# Patient Record
Sex: Female | Born: 1975 | Race: White | Hispanic: No | Marital: Married | Smoking: Former smoker
Health system: Southern US, Community
[De-identification: ages and names within clinical notes are randomized; demographics above are authoritative.]

## PROBLEM LIST (undated history)

## (undated) DIAGNOSIS — F419 Anxiety disorder, unspecified: Secondary | ICD-10-CM

## (undated) DIAGNOSIS — N200 Calculus of kidney: Secondary | ICD-10-CM

## (undated) DIAGNOSIS — F32A Depression, unspecified: Secondary | ICD-10-CM

## (undated) DIAGNOSIS — K219 Gastro-esophageal reflux disease without esophagitis: Secondary | ICD-10-CM

## (undated) DIAGNOSIS — G43909 Migraine, unspecified, not intractable, without status migrainosus: Secondary | ICD-10-CM

## (undated) DIAGNOSIS — F329 Major depressive disorder, single episode, unspecified: Secondary | ICD-10-CM

## (undated) DIAGNOSIS — N8110 Cystocele, unspecified: Secondary | ICD-10-CM

## (undated) HISTORY — DX: Migraine, unspecified, not intractable, without status migrainosus: G43.909

## (undated) HISTORY — PX: WISDOM TOOTH EXTRACTION: SHX21

## (undated) HISTORY — DX: Calculus of kidney: N20.0

## (undated) HISTORY — DX: Gastro-esophageal reflux disease without esophagitis: K21.9

## (undated) HISTORY — DX: Depression, unspecified: F32.A

## (undated) HISTORY — DX: Anxiety disorder, unspecified: F41.9

## (undated) HISTORY — DX: Major depressive disorder, single episode, unspecified: F32.9

## (undated) HISTORY — PX: OTHER SURGICAL HISTORY: SHX169

## (undated) HISTORY — DX: Cystocele, unspecified: N81.10

---

## 2008-03-08 LAB — HM HIV SCREENING LAB: HM HIV SCREENING: NEGATIVE

## 2015-02-05 ENCOUNTER — Telehealth: Payer: Self-pay

## 2015-02-05 NOTE — Telephone Encounter (Signed)
That really needs an appointment Please make sure she is not suicidal/homocidal Refer [facility] to [facility] if so or call 911 if emergent Otherwise, appt as soon as available with me or colleague if needed

## 2015-02-05 NOTE — Telephone Encounter (Signed)
Patient notified, she is not suicidal or homicidal. She did not want to schedule an appointment at this time. I gave her the contact info for [facility] and advised her she could check them out.

## 2015-02-05 NOTE — Telephone Encounter (Signed)
She wants to know if you'd be willing to write her an rx for an antidepressant.

## 2015-08-25 ENCOUNTER — Ambulatory Visit (INDEPENDENT_AMBULATORY_CARE_PROVIDER_SITE_OTHER): Payer: BLUE CROSS/BLUE SHIELD | Admitting: Obstetrics and Gynecology

## 2015-08-25 ENCOUNTER — Other Ambulatory Visit: Payer: Self-pay | Admitting: Obstetrics and Gynecology

## 2015-08-25 ENCOUNTER — Encounter: Payer: Self-pay | Admitting: Obstetrics and Gynecology

## 2015-08-25 VITALS — BP 120/55 | HR 80 | Ht 62.0 in | Wt 142.0 lb

## 2015-08-25 DIAGNOSIS — Z01419 Encounter for gynecological examination (general) (routine) without abnormal findings: Secondary | ICD-10-CM

## 2015-08-25 DIAGNOSIS — F3281 Premenstrual dysphoric disorder: Secondary | ICD-10-CM | POA: Diagnosis not present

## 2015-08-25 DIAGNOSIS — Z30431 Encounter for routine checking of intrauterine contraceptive device: Secondary | ICD-10-CM

## 2015-08-25 MED ORDER — FLUOXETINE HCL 10 MG PO CAPS: 10.0000 mg | ORAL_CAPSULE | Freq: Every day | ORAL | Status: DC

## 2015-08-25 NOTE — Progress Notes (Signed)
Subjective:   Mia Thompson is a 40 y.o. G3P0 Caucasian female here for a routine well-woman exam.  No LMP recorded. Patient is not currently having periods (Reason: IUD).    Current complaints: premenstrual depression and hot flashes, flare up of migraines at same time, amenorrhea secondary to Mirena PCP: Chestine Spore       Doesn't need blood work, but is past due for pap  Social History: Sexual: heterosexual Marital Status: married Living situation: with family Occupation: Associate Professor at [employer] Drug Tobacco/alcohol: no tobacco use Illicit drugs: no history of illicit drug use  The following portions of the patient's history were reviewed and updated as appropriate: allergies, current medications, past family history, past medical history, past social history, past surgical history and problem list.  Past Medical History Past Medical History  Diagnosis Date  . Migraines   . Prolapse of female bladder, acquired     Past Surgical History History reviewed. No pertinent past surgical history.  Gynecologic History G3P0  No LMP recorded. Patient is not currently having periods (Reason: IUD). Contraception: IUD Last Pap: ?Marland Kitchen Results were: normal  Obstetric History OB History  Gravida Para Term Preterm AB SAB TAB Ectopic Multiple Living  3         3    # Outcome Date GA Lbr Len/2nd Weight Sex Delivery Anes PTL Lv  3 Gravida 2011    M Vag-Spont   Y  2 Gravida 2005    F Vag-Spont   Y  1 Gravida 2002    F Vag-Spont   Y      Current Medications No current outpatient prescriptions on file prior to visit.   No current facility-administered medications on file prior to visit.    Review of Systems Patient denies any headaches, blurred vision, shortness of breath, chest pain, abdominal pain, problems with bowel movements, urination, or intercourse.  Objective:  BP 120/55 mmHg  Pulse 80  Ht  (1.575 m)  Wt 142 lb (64.411 kg)  BMI 25.97 kg/m2 Physical Exam  General:  Well  developed, well nourished, no acute distress. She is alert and oriented x3. Skin:  Warm and dry Neck:  Midline trachea, no thyromegaly or nodules Cardiovascular: Regular rate and rhythm, no murmur heard Lungs:  Effort normal, all lung fields clear to auscultation bilaterally Breasts:  No dominant palpable mass, retraction, or nipple discharge Abdomen:  Soft, non tender, no hepatosplenomegaly or masses Pelvic:  External genitalia is normal in appearance.  The vagina is normal in appearance. The cervix is bulbous, no CMT.  Thin prep pap is done with HR HPV cotesting. Uterus is felt to be normal size, shape, and contour.  No adnexal masses or tenderness noted.IUD string just inside cervix Extremities:  No swelling or varicosities noted Psych:  She has a normal mood and affect  Assessment:   Healthy well-woman exam Premenstrual mood swings and migraines IUD surviellence  Plan:  Pap obtained, appt made to switch Mirena prozac  po qd prn sent in to take for Premenstrual mood swings.  F/U 1 year for AE, or sooner if needed   Melody Suzan Nailer, CNM

## 2015-08-25 NOTE — Patient Instructions (Signed)
  Place annual gynecologic exam patient instructions here.  Thank you for enrolling in MyChart. Please follow the instructions below to securely access your online medical record. MyChart allows you to send messages to your doctor, view your test results, manage appointments, and more.   How Do I Sign Up? 1. In your Internet browser, go to [employer] and enter [email or web address]. 2. Click on the Sign Up Now link in the Sign In box. You will see the New Member Sign Up page. 3. Enter your MyChart Access Code exactly as it appears below. You will not need to use this code after you've completed the sign-up process. If you do not sign up before the expiration date, you must request a new code.  MyChart Access Code: [email or web address] Expires: 10/06/2015  1:12 PM  4. Enter your Social Security Number ([id number]) and Date of Birth (mm/dd/yyyy) as indicated and click Submit. You will be taken to the next sign-up page. 5. Create a MyChart ID. This will be your MyChart login ID and cannot be changed, so think of one that is secure and easy to remember. 6. Create a MyChart password. You can change your password at any time. 7. Enter your Password Reset Question and Answer. This can be used at a later time if you forget your password.  8. Enter your e-mail address. You will receive e-mail notification when new information is available in MyChart. 9. Click Sign Up. You can now view your medical record.   Additional Information Remember, MyChart is NOT to be used for urgent needs. For medical emergencies, dial 911.

## 2015-08-31 LAB — CYTOLOGY - PAP

## 2015-09-03 ENCOUNTER — Encounter: Payer: Self-pay | Admitting: Obstetrics and Gynecology

## 2015-09-03 ENCOUNTER — Ambulatory Visit (INDEPENDENT_AMBULATORY_CARE_PROVIDER_SITE_OTHER): Payer: BLUE CROSS/BLUE SHIELD | Admitting: Obstetrics and Gynecology

## 2015-09-03 VITALS — BP 88/46 | HR 61 | Wt 141.8 lb

## 2015-09-03 DIAGNOSIS — L309 Dermatitis, unspecified: Secondary | ICD-10-CM

## 2015-09-03 DIAGNOSIS — Z30433 Encounter for removal and reinsertion of intrauterine contraceptive device: Secondary | ICD-10-CM | POA: Diagnosis not present

## 2015-09-03 MED ORDER — CLOBETASOL PROPIONATE 0.05 % EX OINT: 1.0000 "application " | TOPICAL_OINTMENT | Freq: Two times a day (BID) | CUTANEOUS | Status: DC

## 2015-09-03 NOTE — Progress Notes (Signed)
Mia Thompson is a 40 y.o. year old G77P0 Caucasian female who presents for removal and replacement of a Mirena IUD. She was given informed consent for removal and reinsertion of her Mirena. Her Mirena was placed 2011, No LMP recorded. Patient is not currently having periods (Reason: IUD)., and her pregnancy test today was negative.   The risks and benefits of the method and placement have been thouroughly reviewed with the patient and all questions were answered.  Specifically the patient is aware of failure rate of 08/998, expulsion of the IUD and of possible perforation.  The patient is aware of irregular bleeding due to the method and understands the incidence of irregular bleeding diminishes with time.  Signed copy of informed consent in chart.   No LMP recorded. Patient is not currently having periods (Reason: IUD). BP 88/46 mmHg  Pulse 61  Wt 141 lb 12.8 oz (64.32 kg) No results found for this or any previous visit (from the past 24 hour(s)).   Also reports dry itchy skin on mid lower back that flares up every winter. Eczema noted and Clobex cream prescribed.   Appropriate time out taken. A graves speculum was placed in the vagina.  The cervix was visualized, prepped using Betadine. The strings were visible. They were grasped and the Mirena was easily removed. The cervix was then grasped with a single-tooth tenaculum. The uterus was found to be anteroflexed and it sounded to 8 cm.  Mirena IUD placed per manufacturer's recommendations without complications. The strings were trimmed to 3 cm.  The patient tolerated the procedure well.    The patient was given post procedure instructions, including signs and symptoms of infection and to check for the strings after each menses or each month, and refraining from intercourse or anything in the vagina for 3 days.  She was given a Mirena care card with date Mirena placed, and date Mirena to be removed.    Melody Suzan Nailer, CNM

## 2015-09-03 NOTE — Patient Instructions (Signed)
IUD PLACEMENT POST-PROCEDURE INSTRUCTIONS  1. You may take Ibuprofen, Aleve or Tylenol for pain if needed.  Cramping should resolve within in 24 hours.  2. You may have a small amount of spotting.  You should wear a mini pad for the next few days.  3. You may have intercourse after 24 hours.  If you using this for birth control, it is effective immediately.  4. You need to call if you have any pelvic pain, fever, heavy bleeding or foul smelling vaginal discharge.  Irregular bleeding is common the first several months after having an IUD placed. You do not need to call for this reason unless you are concerned.  5. Shower or bathe as normal  Hydrocortisone skin cream, ointment, lotion, or solution What is this medicine? HYDROCORTISONE (hye droe KOR ti sone) is a corticosteroid. It is used on the skin to reduce swelling, redness, itching, and allergic reactions. This medicine may be used for other purposes; ask your health care provider or pharmacist if you have questions. What should I tell my health care provider before I take this medicine? They need to know if you have any of these conditions: -any active infection -diabetes -large areas of burned or damaged skin -skin wasting or thinning -an unusual or allergic reaction to hydrocortisone, corticosteroids, sulfites, other medicines, foods, dyes, or preservatives -pregnant or trying to get pregnant -breast-feeding How should I use this medicine? This medicine is for external use only. Do not take by mouth. Follow the directions on the prescription label. Wash your hands before and after use. Apply a thin film of medicine to the affected area. Do not cover with a bandage or dressing unless your doctor or health care professional tells you to. Do not use on healthy skin or over large areas of skin. Do not get this medicine in your eyes. If you do, rinse out with plenty of cool tap water. Do not to use more medicine than prescribed. Do not use  your medicine more often than directed or for more than 14 days. Talk to your pediatrician regarding the use of this medicine in children. Special care may be needed. While this drug may be prescribed for children as young as 23 years of age for selected conditions, precautions do apply. Do not use this medicine for the treatment of diaper rash unless directed to do so by your doctor or health care professional. If applying this medicine to the diaper area of a child, do not cover with tight-fitting diapers or plastic pants. This may increase the amount of medicine that passes through the skin and increase the risk of serious side effects. Elderly patients are more likely to have damaged skin through aging, and this may increase side effects. This medicine should only be used for brief periods and infrequently in older patients. Overdosage: If you think you have taken too much of this medicine contact a poison control center or emergency room at once. NOTE: This medicine is only for you. Do not share this medicine with others. What if I miss a dose? If you miss a dose, use it as soon as you can. If it is almost time for your next dose, use only that dose. Do not use double or extra doses. What may interact with this medicine? Interactions are not expected. Do not use any other skin products on the affected area without asking your doctor or health care professional. This list may not describe all possible interactions. Give your health care provider  a list of all the medicines, herbs, non-prescription drugs, or dietary supplements you use. Also tell them if you smoke, drink alcohol, or use illegal drugs. Some items may interact with your medicine. What should I watch for while using this medicine? Tell your doctor or health care professional if your symptoms do not start to get better within 7 days or if they get worse. Tell your doctor or health care professional if you are exposed to anyone with measles  or chickenpox, or if you develop sores or blisters that do not heal properly. What side effects may I notice from receiving this medicine? Side effects that you should report to your doctor or health care professional as soon as possible: -allergic reactions like skin rash, itching or hives, swelling of the face, lips, or tongue -burning feeling on the skin -dark red spots on the skin -infection -lack of healing of skin condition -painful, red, pus filled blisters in hair follicles -thinning of the skin Side effects that usually do not require medical attention (report to your doctor or health care professional if they continue or are bothersome): -dry skin, irritation -unusual increased growth of hair on the face or body This list may not describe all possible side effects. Call your doctor for medical advice about side effects. You may report side effects to FDA at 1-800-FDA-1088. Where should I keep my medicine? Keep out of the reach of children. Store at room temperature between 15 and 30 degrees C (59 and 86 degrees F). Do not freeze. Throw away any unused medicine after the expiration date. NOTE: This sheet is a summary. It may not cover all possible information. If you have questions about this medicine, talk to your doctor, pharmacist, or health care provider.    2016, Elsevier/Gold Standard. (2007-12-07 16:08:48)

## 2015-12-01 ENCOUNTER — Telehealth: Payer: Self-pay | Admitting: Obstetrics and Gynecology

## 2015-12-01 NOTE — Telephone Encounter (Signed)
pls advise

## 2015-12-01 NOTE — Telephone Encounter (Signed)
Patient called complaining of a cycle every 3 weeks since having her IUD placed a couple months ago. She wanted to know if something can be called in.Thanks

## 2015-12-02 ENCOUNTER — Other Ambulatory Visit: Payer: Self-pay | Admitting: *Deleted

## 2015-12-02 MED ORDER — NORETHIN-ETH ESTRAD-FE BIPHAS 1 MG-10 MCG / 10 MCG PO TABS: 1.0000 | ORAL_TABLET | Freq: Every day | ORAL | Status: DC

## 2015-12-02 NOTE — Telephone Encounter (Signed)
Done-ac 

## 2015-12-02 NOTE — Telephone Encounter (Signed)
She could do a pack or two of LoLoestrin, samples OK, skip placebos.

## 2016-03-10 ENCOUNTER — Ambulatory Visit (INDEPENDENT_AMBULATORY_CARE_PROVIDER_SITE_OTHER): Payer: BLUE CROSS/BLUE SHIELD | Admitting: Family Medicine

## 2016-03-10 DIAGNOSIS — F411 Generalized anxiety disorder: Secondary | ICD-10-CM

## 2016-03-10 DIAGNOSIS — J309 Allergic rhinitis, unspecified: Secondary | ICD-10-CM | POA: Diagnosis not present

## 2016-03-10 DIAGNOSIS — G43909 Migraine, unspecified, not intractable, without status migrainosus: Secondary | ICD-10-CM | POA: Diagnosis not present

## 2016-03-10 MED ORDER — ESCITALOPRAM OXALATE 10 MG PO TABS: 10.0000 mg | ORAL_TABLET | Freq: Every day | ORAL | 0 refills | Status: DC

## 2016-03-10 MED ORDER — FLUTICASONE PROPIONATE 50 MCG/ACT NA SUSP: 2.0000 | Freq: Every day | NASAL | 3 refills | Status: DC

## 2016-03-10 MED ORDER — TOPIRAMATE 100 MG PO TABS: 100.0000 mg | ORAL_TABLET | Freq: Every day | ORAL | 0 refills | Status: DC

## 2016-03-10 MED ORDER — MONTELUKAST SODIUM 10 MG PO TABS: 10.0000 mg | ORAL_TABLET | Freq: Every day | ORAL | 0 refills | Status: DC

## 2016-03-10 NOTE — Assessment & Plan Note (Signed)
Stable on singulair, flonase, and claritin. Refills today.

## 2016-03-10 NOTE — Assessment & Plan Note (Signed)
Improved with current regimen. Refill topamax today.

## 2016-03-10 NOTE — Progress Notes (Signed)
BP 101/64   Pulse 63   Temp 97.6 F (36.4 C)   Wt 148 lb (67.1 kg)   SpO2 100%   BMI 27.07 kg/m    Subjective:    Patient ID: Mia Thompson, female    DOB: [birth year], 40 y.o.   MRN: [id number]  HPI: Mia Thompson is a 40 y.o. female  Chief Complaint  Patient presents with  . Allergic Rhinitis     worse lately since she has been out of her Singular for 2 weeks. Would like a refill on it and Flonase.  . Medication Refill    She also would like a refill on Topamax, Lexapro. Prefers 90 day supply.   Patient presents for medication management today. Allergy symptoms flaring since running out of singulair 2 weeks ago, previously under good control with that plus flonase and claritin.   Only having 1-2 mild migraines a month on current regimen of topamax with imitrex, baclofen, and ibuprofen during flares. Has seen HA specialist several times, feels she is under good enough control now to stop seeing them.   Anxiety and moods are doing well with current regimen of lexapro.   Relevant past medical, surgical, family and social history reviewed and updated as indicated. Interim medical history since our last visit reviewed. Allergies and medications reviewed and updated.  Past Medical History:  Diagnosis Date  . Anxiety   . Depression   . GERD (gastroesophageal reflux disease)   . Migraines   . Migraines    sees Neuro  . 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 Marital status: Married    Spouse name: N/A  . Number of children: N/A  . Years of education: N/A   Occupational History  . Not on file.   Social History Main Topics  . Smoking status: Never Smoker  . Smokeless tobacco: Never Used  . Alcohol use Yes     Comment: occas  . Drug use: No  . Sexual activity: Yes    Birth control/ protection: IUD     Comment: mirena   Other Topics Concern  . Not on file   Social History Narrative  . No narrative on file    Review of Systems   Constitutional: Negative.   HENT: Positive for rhinorrhea and sneezing.   Eyes: Negative.   Respiratory: Negative.   Cardiovascular: Negative.   Gastrointestinal: Negative.   Musculoskeletal: Negative.   Allergic/Immunologic: Positive for environmental allergies.  Neurological: Negative.   Psychiatric/Behavioral: Negative.     Per HPI unless specifically indicated above     Objective:    BP 101/64   Pulse 63   Temp 97.6 F (36.4 C)   Wt 148 lb (67.1 kg)   SpO2 100%   BMI 27.07 kg/m   Wt Readings from Last 3 Encounters:  03/10/16 148 lb (67.1 kg)  09/03/15 141 lb 12.8 oz (64.3 kg)  08/25/15 142 lb (64.4 kg)    Physical Exam  Constitutional: She is oriented to person, place, and time. She appears well-developed and well-nourished.  HENT:  Head: Atraumatic.  Mouth/Throat: No oropharyngeal exudate.  Nasal turbinates boggy b/l  Eyes: Conjunctivae are normal. No scleral icterus.  Neck: Normal range of motion. Neck supple.  Cardiovascular: Normal rate and normal heart sounds.   Pulmonary/Chest: Effort normal and breath sounds normal.  Musculoskeletal: Normal range of motion.  Lymphadenopathy:    She has no cervical adenopathy.  Neurological: She is alert and  oriented to person, place, and time.  Skin: Skin is warm and dry.  Psychiatric: She has a normal mood and affect. Her behavior is normal.  Nursing note reviewed.     Assessment & Plan:   Problem List Items Addressed This Visit      Cardiovascular and Mediastinum   Migraine    Improved with current regimen. Refill topamax today.      Relevant Medications   baclofen (LIORESAL) 10 MG tablet   SUMAtriptan (IMITREX) 100 MG tablet   escitalopram (LEXAPRO) 10 MG tablet   topiramate (TOPAMAX) 100 MG tablet     Respiratory   Allergic rhinitis    Stable on singulair, flonase, and claritin. Refills today.         Other   Anxiety, generalized    Well controlled with lexapro, refilled today. Continue current  regimen       Other Visit Diagnoses   None.      Follow up plan: Return if symptoms worsen or fail to improve.

## 2016-03-10 NOTE — Patient Instructions (Signed)
Follow up as needed

## 2016-03-10 NOTE — Assessment & Plan Note (Signed)
Well controlled with lexapro, refilled today. Continue current regimen

## 2016-03-17 ENCOUNTER — Ambulatory Visit: Payer: BLUE CROSS/BLUE SHIELD | Admitting: Family Medicine

## 2016-06-14 ENCOUNTER — Other Ambulatory Visit: Payer: Self-pay | Admitting: Family Medicine

## 2016-06-14 NOTE — Telephone Encounter (Signed)
Routing to provider  

## 2016-06-15 ENCOUNTER — Telehealth: Payer: Self-pay

## 2016-06-15 MED ORDER — TOPIRAMATE 100 MG PO TABS: 100.0000 mg | ORAL_TABLET | Freq: Every day | ORAL | 3 refills | Status: DC

## 2016-06-15 NOTE — Telephone Encounter (Signed)
Refills sent

## 2016-06-15 NOTE — Telephone Encounter (Signed)
She wants to know if you can add refills on her rx for Topiramate.

## 2016-08-26 ENCOUNTER — Encounter: Payer: BLUE CROSS/BLUE SHIELD | Admitting: Obstetrics and Gynecology

## 2017-01-16 ENCOUNTER — Ambulatory Visit (INDEPENDENT_AMBULATORY_CARE_PROVIDER_SITE_OTHER): Payer: BLUE CROSS/BLUE SHIELD | Admitting: Family Medicine

## 2017-01-16 ENCOUNTER — Encounter: Payer: Self-pay | Admitting: Family Medicine

## 2017-01-16 VITALS — BP 103/68 | HR 70 | Temp 97.8°F | Wt 160.0 lb

## 2017-01-16 DIAGNOSIS — F411 Generalized anxiety disorder: Secondary | ICD-10-CM

## 2017-01-16 DIAGNOSIS — G43909 Migraine, unspecified, not intractable, without status migrainosus: Secondary | ICD-10-CM | POA: Diagnosis not present

## 2017-01-16 MED ORDER — KETOROLAC TROMETHAMINE 60 MG/2ML IM SOLN
60.0000 mg | Freq: Once | INTRAMUSCULAR | Status: AC
  Administered 2017-01-16: 60 mg via INTRAMUSCULAR

## 2017-01-16 MED ORDER — PROMETHAZINE HCL 25 MG PO TABS: 25.0000 mg | ORAL_TABLET | Freq: Three times a day (TID) | ORAL | 0 refills | Status: DC | PRN

## 2017-01-16 MED ORDER — RIZATRIPTAN BENZOATE 10 MG PO TABS: 10.0000 mg | ORAL_TABLET | ORAL | 3 refills | Status: DC | PRN

## 2017-01-16 MED ORDER — DULOXETINE HCL 20 MG PO CPEP: ORAL_CAPSULE | ORAL | 1 refills | Status: DC

## 2017-01-16 NOTE — Progress Notes (Signed)
BP 103/68   Pulse 70   Temp 97.8 F (36.6 C)   Wt 160 lb (72.6 kg)   SpO2 100%   BMI 29.26 kg/m    Subjective:    Patient ID: Mia Thompson, female    DOB: 05-10-1976, 41 y.o.   MRN: [id number]  HPI: Mia Thompson is a 41 y.o. female  Chief Complaint  Patient presents with  . Headache    Migraine since Saturday. Across her forehead. Tried Imitrex, IBU, Goody powder with tempoary relief. Neuro can't see her until Sept.    Patient presents with worsening migraines the past few weeks. Current migraine started 3 days ago and has been waxing and waning since. Dizziness, nausea, pain across entire forehead, fatigue. Has taken several doses of imitrex, topamax, and ibuprofen with only temporary relief. Has zofran but states it never helps her. Tried a friend's maxalt with some good relief. States she rarely takes the baclofen but did try it with no resolution. Can't get in with her HA specialist until September.   Also notes that she took herself slowly off lexapro d/t low libido and 10-15 lb weight gain while on it. States her anxiety has gotten out of control since being off and wanting to start something else.   Past Medical History:  Diagnosis Date  . Anxiety   . Depression   . GERD (gastroesophageal reflux disease)   . Migraines   . Migraines    sees Neuro  . 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 Marital status: Married    Spouse name: N/A  . Number of children: N/A  . Years of education: N/A   Occupational History  . Not on file.   Social History Main Topics  . Smoking status: Never Smoker  . Smokeless tobacco: Never Used  . Alcohol use Yes     Comment: occas  . Drug use: No  . Sexual activity: Yes    Birth control/ protection: IUD     Comment: mirena   Other Topics Concern  . Not on file   Social History Narrative  . No narrative on file   Relevant past medical, surgical, family and social history reviewed and updated as  indicated. Interim medical history since our last visit reviewed. Allergies and medications reviewed and updated.  Review of Systems  Constitutional: Negative.   HENT: Negative.   Eyes:       Floaters  Respiratory: Negative.   Cardiovascular: Negative.   Gastrointestinal: Positive for nausea.  Genitourinary: Negative.   Musculoskeletal: Negative.   Neurological: Positive for headaches.  Psychiatric/Behavioral: Negative.    Per HPI unless specifically indicated above     Objective:    BP 103/68   Pulse 70   Temp 97.8 F (36.6 C)   Wt 160 lb (72.6 kg)   SpO2 100%   BMI 29.26 kg/m   Wt Readings from Last 3 Encounters:  01/16/17 160 lb (72.6 kg)  03/10/16 148 lb (67.1 kg)  09/03/15 141 lb 12.8 oz (64.3 kg)    Physical Exam  Constitutional: She is oriented to person, place, and time. She appears well-developed and well-nourished. No distress.  HENT:  Head: Atraumatic.  Eyes: Conjunctivae are normal. Pupils are equal, round, and reactive to light. No scleral icterus.  Neck: Normal range of motion. Neck supple.  Cardiovascular: Normal rate and normal heart sounds.   Pulmonary/Chest: Effort normal and breath sounds normal. No respiratory distress.  Musculoskeletal:  Normal range of motion.  Neurological: She is alert and oriented to person, place, and time.  Skin: Skin is warm and dry.  Psychiatric: She has a normal mood and affect. Her behavior is normal.  Nursing note and vitals reviewed.     Assessment & Plan:   Problem List Items Addressed This Visit      Cardiovascular and Mediastinum   Migraine - Primary    IM toradol given today. Will switch to maxalt since it seemed to help more. Continue tomapax. Hoping cymbalta will help some with preventing frequent HAs for her. Baclofen and phenergan prn. F/u in 6 weeks for recheck. Will see HA Specialist in September as scheduled.       Relevant Medications   rizatriptan (MAXALT) 10 MG tablet   DULoxetine (CYMBALTA) 20  MG capsule   ketorolac (TORADOL) injection 60 mg (Completed) (Start on 01/16/2017  2:45 PM)     Other   Anxiety, generalized    Self d/c'd lexapro d/t side effects. Pt agreeable to trying cymbalta to see if it will also help with her headaches. Risks and benefits reviewed. Will f/u in 4-6 weeks.           Follow up plan: Return in about 6 weeks (around 02/27/2017) for Headache f/u.

## 2017-01-16 NOTE — Assessment & Plan Note (Addendum)
IM toradol given today. Will switch to maxalt since it seemed to help more. Continue tomapax. Hoping cymbalta will help some with preventing frequent HAs for her. Baclofen and phenergan prn. F/u in 6 weeks for recheck. Will see HA Specialist in September as scheduled.

## 2017-01-16 NOTE — Assessment & Plan Note (Signed)
Self d/c'd lexapro d/t side effects. Pt agreeable to trying cymbalta to see if it will also help with her headaches. Risks and benefits reviewed. Will f/u in 4-6 weeks.

## 2017-01-26 ENCOUNTER — Encounter: Payer: Self-pay | Admitting: Obstetrics and Gynecology

## 2017-03-01 ENCOUNTER — Encounter: Payer: Self-pay | Admitting: Family Medicine

## 2017-03-01 ENCOUNTER — Ambulatory Visit (INDEPENDENT_AMBULATORY_CARE_PROVIDER_SITE_OTHER): Payer: BLUE CROSS/BLUE SHIELD | Admitting: Family Medicine

## 2017-03-01 VITALS — BP 102/65 | HR 84 | Temp 98.2°F | Wt 164.0 lb

## 2017-03-01 DIAGNOSIS — F411 Generalized anxiety disorder: Secondary | ICD-10-CM

## 2017-03-01 DIAGNOSIS — N921 Excessive and frequent menstruation with irregular cycle: Secondary | ICD-10-CM | POA: Diagnosis not present

## 2017-03-01 DIAGNOSIS — G43909 Migraine, unspecified, not intractable, without status migrainosus: Secondary | ICD-10-CM | POA: Diagnosis not present

## 2017-03-01 MED ORDER — BACLOFEN 10 MG PO TABS: 10.0000 mg | ORAL_TABLET | Freq: Two times a day (BID) | ORAL | 3 refills | Status: DC | PRN

## 2017-03-01 MED ORDER — LEVONORGEST-ETH ESTRAD 91-DAY 0.15-0.03 &0.01 MG PO TABS: 1.0000 | ORAL_TABLET | Freq: Every day | ORAL | 4 refills | Status: DC

## 2017-03-01 MED ORDER — DULOXETINE HCL 20 MG PO CPEP: 20.0000 mg | ORAL_CAPSULE | Freq: Two times a day (BID) | ORAL | 2 refills | Status: DC

## 2017-03-01 MED ORDER — KETOROLAC TROMETHAMINE 60 MG/2ML IM SOLN
60.0000 mg | Freq: Once | INTRAMUSCULAR | Status: AC
  Administered 2017-03-01: 60 mg via INTRAMUSCULAR

## 2017-03-01 NOTE — Progress Notes (Signed)
BP 102/65   Pulse 84   Temp 98.2 F (36.8 C)   Wt 164 lb (74.4 kg)   SpO2 100%   BMI 30.00 kg/m    Subjective:    Patient ID: Mia Thompson, female    DOB: 1976-03-31, 41 y.o.   MRN: [id number]  HPI: Mia Thompson is a 41 y.o. female  Chief Complaint  Patient presents with  . Anxiety  . Migraine   Patient presents today for anxiety and migraine f/u. Was started last month on cymbalta and doing very well. Feels like it's helping both her HAs and her anxiety. Also doing well on the maxalt rather than imitrex. Started taking a few vitamins that she's read can help migraines. Does have a HA today that she attributes to muscle tension in her neck from sleeping in different beds while traveling recently. Taking baclofen and some ibuprofen rarely with some relief.   Was to have seen her GYN recently regarding over a year of heavy and frequent periods despite having the mirena but the appt got cancelled d/t provider being out and she hasn't yet rescheduled. This is her third mirena, and with previous two she had no bleeding. States she's having cycles every 3 weeks or so and they are very heavy and painful. Was tried on several months of lo loestrin last spring to see if that provided relief but noticed no benefit. No known hx of endometriosis, fibroids, or polyps.  Past Medical History:  Diagnosis Date  . Anxiety   . Depression   . GERD (gastroesophageal reflux disease)   . Migraines   . Migraines    sees Neuro  . 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 Marital status: Married    Spouse name: N/A  . Number of children: N/A  . Years of education: N/A   Occupational History  . Not on file.   Social History Main Topics  . Smoking status: Never Smoker  . Smokeless tobacco: Never Used  . Alcohol use Yes     Comment: occas  . Drug use: No  . Sexual activity: Yes    Birth control/ protection: IUD     Comment: mirena   Other Topics Concern  .  Not on file   Social History Narrative  . No narrative on file    Relevant past medical, surgical, family and social history reviewed and updated as indicated. Interim medical history since our last visit reviewed. Allergies and medications reviewed and updated.  Review of Systems  Constitutional: Negative.   HENT: Negative.   Eyes: Negative.   Respiratory: Negative.   Cardiovascular: Negative.   Gastrointestinal: Negative.   Genitourinary: Negative.   Musculoskeletal: Negative.   Skin: Negative.   Neurological: Positive for headaches.  Psychiatric/Behavioral: Negative.     Per HPI unless specifically indicated above     Objective:    BP 102/65   Pulse 84   Temp 98.2 F (36.8 C)   Wt 164 lb (74.4 kg)   SpO2 100%   BMI 30.00 kg/m   Wt Readings from Last 3 Encounters:  03/01/17 164 lb (74.4 kg)  01/16/17 160 lb (72.6 kg)  03/10/16 148 lb (67.1 kg)    Physical Exam  Constitutional: She is oriented to person, place, and time. She appears well-developed and well-nourished. No distress.  HENT:  Head: Atraumatic.  Eyes: Pupils are equal, round, and reactive to light. Conjunctivae are normal. No scleral icterus.  Neck: Normal range of motion. Neck supple.  Cardiovascular: Normal rate and normal heart sounds.   Pulmonary/Chest: Effort normal and breath sounds normal. No respiratory distress.  Musculoskeletal: Normal range of motion.  Neurological: She is alert and oriented to person, place, and time. No cranial nerve deficit.  Skin: Skin is warm and dry.  Psychiatric: She has a normal mood and affect. Her behavior is normal.  Nursing note and vitals reviewed.     Assessment & Plan:   Problem List Items Addressed This Visit      Cardiovascular and Mediastinum   Migraine - Primary    Doing well on current regimen, with recent flare due more to neck tension. Continue current regimen and increase baclofen to nightly or twice daily prn. Continue massage, heat, and rest  prn. IM toradol given today.       Relevant Medications   DULoxetine (CYMBALTA) 20 MG capsule   baclofen (LIORESAL) 10 MG tablet   ketorolac (TORADOL) injection 60 mg (Completed)     Other   Anxiety, generalized    Stable on cymbalta, doing much better. Continue current regimen       Other Visit Diagnoses    Menorrhagia with irregular cycle       Will try seasonique to help reduce bleeding, discussed to reschedule for further work-up with GYN. Will check a CBC given duration and amount of bleeding   Relevant Orders   CBC with Differential/Platelet      Follow up plan: Return in about 3 months (around 06/01/2017) for CPE.

## 2017-03-01 NOTE — Assessment & Plan Note (Signed)
Stable on cymbalta, doing much better. Continue current regimen

## 2017-03-01 NOTE — Assessment & Plan Note (Addendum)
Doing well on current regimen, with recent flare due more to neck tension. Continue current regimen and increase baclofen to nightly or twice daily prn. Continue massage, heat, and rest prn. IM toradol given today.

## 2017-03-02 LAB — CBC WITH DIFFERENTIAL/PLATELET
Basophils Absolute: 0 x10E3/uL (ref 0.0–0.2)
Basos: 0 %
EOS (ABSOLUTE): 0.5 x10E3/uL — ABNORMAL HIGH (ref 0.0–0.4)
Eos: 5 %
Hematocrit: 41.5 % (ref 34.0–46.6)
Hemoglobin: 13.9 g/dL (ref 11.1–15.9)
Immature Grans (Abs): 0 x10E3/uL (ref 0.0–0.1)
Immature Granulocytes: 0 %
Lymphocytes Absolute: 2 x10E3/uL (ref 0.7–3.1)
Lymphs: 22 %
MCH: 31.3 pg (ref 26.6–33.0)
MCHC: 33.5 g/dL (ref 31.5–35.7)
MCV: 94 fL (ref 79–97)
Monocytes Absolute: 0.6 x10E3/uL (ref 0.1–0.9)
Monocytes: 7 %
Neutrophils Absolute: 6.2 x10E3/uL (ref 1.4–7.0)
Neutrophils: 66 %
Platelets: 258 x10E3/uL (ref 150–379)
RBC: 4.44 x10E6/uL (ref 3.77–5.28)
RDW: 13 % (ref 12.3–15.4)
WBC: 9.4 x10E3/uL (ref 3.4–10.8)

## 2017-04-06 ENCOUNTER — Encounter: Payer: Self-pay | Admitting: Obstetrics and Gynecology

## 2017-04-06 ENCOUNTER — Ambulatory Visit (INDEPENDENT_AMBULATORY_CARE_PROVIDER_SITE_OTHER): Payer: BLUE CROSS/BLUE SHIELD | Admitting: Obstetrics and Gynecology

## 2017-04-06 VITALS — BP 98/63 | HR 100 | Ht 62.0 in | Wt 166.1 lb

## 2017-04-06 DIAGNOSIS — Z01419 Encounter for gynecological examination (general) (routine) without abnormal findings: Secondary | ICD-10-CM | POA: Diagnosis not present

## 2017-04-06 DIAGNOSIS — Z975 Presence of (intrauterine) contraceptive device: Secondary | ICD-10-CM | POA: Diagnosis not present

## 2017-04-06 DIAGNOSIS — N921 Excessive and frequent menstruation with irregular cycle: Secondary | ICD-10-CM | POA: Diagnosis not present

## 2017-04-06 MED ORDER — MEDROXYPROGESTERONE ACETATE 150 MG/ML IM SUSP: 150.0000 mg | INTRAMUSCULAR | 4 refills | Status: DC

## 2017-04-06 NOTE — Patient Instructions (Signed)
Preventive Care 18-39 Years, Female Preventive care refers to lifestyle choices and visits with your health care provider that can promote health and wellness. What does preventive care include?  A yearly physical exam. This is also called an annual well check.  Dental exams once or twice a year.  Routine eye exams. Ask your health care provider how often you should have your eyes checked.  Personal lifestyle choices, including: ? Daily care of your teeth and gums. ? Regular physical activity. ? Eating a healthy diet. ? Avoiding tobacco and drug use. ? Limiting alcohol use. ? Practicing safe sex. ? Taking vitamin and mineral supplements as recommended by your health care provider. What happens during an annual well check? The services and screenings done by your health care provider during your annual well check will depend on your age, overall health, lifestyle risk factors, and family history of disease. Counseling Your health care provider may ask you questions about your:  Alcohol use.  Tobacco use.  Drug use.  Emotional well-being.  Home and relationship well-being.  Sexual activity.  Eating habits.  Work and work Statistician.  Method of birth control.  Menstrual cycle.  Pregnancy history.  Screening You may have the following tests or measurements:  Height, weight, and BMI.  Diabetes screening. This is done by checking your blood sugar (glucose) after you have not eaten for a while (fasting).  Blood pressure.  Lipid and cholesterol levels. These may be checked every 5 years starting at age 38.  Skin check.  Hepatitis C blood test.  Hepatitis B blood test.  Sexually transmitted disease (STD) testing.  BRCA-related cancer screening. This may be done if you have a family history of breast, ovarian, tubal, or peritoneal cancers.  Pelvic exam and Pap test. This may be done every 3 years starting at age 38. Starting at age 30, this may be done  every 5 years if you have a Pap test in combination with an HPV test.  Discuss your test results, treatment options, and if necessary, the need for more tests with your health care provider. Vaccines Your health care provider may recommend certain vaccines, such as:  Influenza vaccine. This is recommended every year.  Tetanus, diphtheria, and acellular pertussis (Tdap, Td) vaccine. You may need a Td booster every 10 years.  Varicella vaccine. You may need this if you have not been vaccinated.  HPV vaccine. If you are 39 or younger, you may need three doses over 6 months.  Measles, mumps, and rubella (MMR) vaccine. You may need at least one dose of MMR. You may also need a second dose.  Pneumococcal 13-valent conjugate (PCV13) vaccine. You may need this if you have certain conditions and were not previously vaccinated.  Pneumococcal polysaccharide (PPSV23) vaccine. You may need one or two doses if you smoke cigarettes or if you have certain conditions.  Meningococcal vaccine. One dose is recommended if you are age 68-21 years and a first-year college student living in a residence hall, or if you have one of several medical conditions. You may also need additional booster doses.  Hepatitis A vaccine. You may need this if you have certain conditions or if you travel or work in places where you may be exposed to hepatitis A.  Hepatitis B vaccine. You may need this if you have certain conditions or if you travel or work in places where you may be exposed to hepatitis B.  Haemophilus influenzae type b (Hib) vaccine. You may need this  if you have certain risk factors.  Talk to your health care provider about which screenings and vaccines you need and how often you need them. This information is not intended to replace advice given to you by your health care provider. Make sure you discuss any questions you have with your health care provider. Document Released: 09/20/2001 Document Revised:  04/13/2016 Document Reviewed: 05/26/2015 Elsevier Interactive Patient Education  2017 Elsevier Inc.  

## 2017-04-06 NOTE — Progress Notes (Signed)
Subjective:   Mia Thompson is a 41 y.o. G20P0 Caucasian female here for a routine well-woman exam.  Patient's last menstrual period was 03/25/2017.    Current complaints: Still having irregular bleeding that is prolonged at times since replacing Mirena , not resolved with OCPs. PCP: Laural Benes       doesn't desire labs  Social History: Sexual: heterosexual Marital Status: married Living situation: with father Occupation: Best boy at [employer] Tobacco/alcohol: no tobacco use Illicit drugs: no history of illicit drug use  The following portions of the patient's history were reviewed and updated as appropriate: allergies, current medications, past family history, past medical history, past social history, past surgical history and problem list.  Past Medical History Past Medical History:  Diagnosis Date  . Anxiety   . Depression   . GERD (gastroesophageal reflux disease)   . Migraines   . Migraines    sees Neuro  . Prolapse of female bladder, acquired     Past Surgical History History reviewed. No pertinent surgical history.  Gynecologic History G3P0  Patient's last menstrual period was 03/25/2017. Contraception: IUD Last Pap: 2017. Results were: normal   Obstetric History OB History  Gravida Para Term Preterm AB Living  3         3  SAB TAB Ectopic Multiple Live Births          3    # Outcome Date GA Lbr Len/2nd Weight Sex Delivery Anes PTL Lv  3 Gravida 2011    M Vag-Spont   LIV  2 Gravida 2005    F Vag-Spont   LIV  1 Gravida 2002    F Vag-Spont   LIV      Current Medications Current Outpatient Prescriptions on File Prior to Visit  Medication Sig Dispense Refill  . baclofen (LIORESAL) 10 MG tablet Take 1 tablet (10 mg total) by mouth 2 (two) times daily as needed. 60 each 3  . DULoxetine (CYMBALTA) 20 MG capsule Take 1 capsule (20 mg total) by mouth 2 (two) times daily. 60 capsule 2  . fluticasone (FLONASE) 50 MCG/ACT nasal spray Place 2 sprays into both nostrils  daily. 16 g 3  . levonorgestrel (MIRENA) 20 MCG/24HR IUD 1 each by Intrauterine route once.    . Levonorgestrel-Ethinyl Estradiol (AMETHIA,CAMRESE) 0.15-0.03 &0.01 MG tablet Take 1 tablet by mouth daily. 1 Package 4  . Loratadine (CLARITIN) 10 MG CAPS Take by mouth.    . montelukast (SINGULAIR) 10 MG tablet Take 1 tablet (10 mg total) by mouth at bedtime. 90 tablet 3  . Multiple Vitamin (MULTI-VITAMINS) TABS Take by mouth daily.    . promethazine (PHENERGAN) 25 MG tablet Take 1 tablet (25 mg total) by mouth every 8 (eight) hours as needed for nausea or vomiting. 20 tablet 0  . rizatriptan (MAXALT) 10 MG tablet Take 1 tablet (10 mg total) by mouth as needed for migraine. May repeat in 2 hours if needed 10 tablet 3  . clobetasol ointment (TEMOVATE) 0.05 % Apply 1 application topically 2 (two) times daily. Apply to affected area (Patient not taking: Reported on 04/06/2017) 30 g 2  . topiramate (TOPAMAX) 100 MG tablet Take 1 tablet (100 mg total) by mouth at bedtime. 90 tablet 3   No current facility-administered medications on file prior to visit.     Review of Systems Patient denies any headaches, blurred vision, shortness of breath, chest pain, abdominal pain, problems with bowel movements, urination, or intercourse.  Objective:  BP 98/63  Pulse 100   Ht 5\' 2"  (1.575 m)   Wt 166 lb 1.6 oz (75.3 kg)   LMP 03/25/2017   BMI 30.38 kg/m  Physical Exam  General:  Well developed, well nourished, no acute distress. She is alert and oriented x3. Skin:  Warm and dry Neck:  Midline trachea, no thyromegaly or nodules Cardiovascular: Regular rate and rhythm, no murmur heard Lungs:  Effort normal, all lung fields clear to auscultation bilaterally Breasts:  No dominant palpable mass, retraction, or nipple discharge Abdomen:  Soft, non tender, no hepatosplenomegaly or masses Pelvic:  External genitalia is normal in appearance.  The vagina is normal in appearance. The cervix is bulbous, no CMT. IUD  string noted  Thin prep pap is not doen. Uterus is felt to be normal size, shape, and contour.  No adnexal masses or tenderness noted. Extremities:  No swelling or varicosities noted Psych:  She has a normal mood and affect  Assessment:   Healthy well-woman exam IUD irregular bleeding PMS  Plan:  Recommended IUD removal and switching to Depo- rx sent in and will return next week for removal and injection. F/U 1 year for AE, or sooner if needed Mammogram ordered  Maisa Bedingfield Suzan NailerN Bethanee Redondo, CNM

## 2017-04-11 ENCOUNTER — Ambulatory Visit (INDEPENDENT_AMBULATORY_CARE_PROVIDER_SITE_OTHER): Payer: BLUE CROSS/BLUE SHIELD | Admitting: Obstetrics and Gynecology

## 2017-04-11 ENCOUNTER — Encounter: Payer: Self-pay | Admitting: Obstetrics and Gynecology

## 2017-04-11 VITALS — BP 112/83 | HR 79 | Wt 164.4 lb

## 2017-04-11 DIAGNOSIS — Z3042 Encounter for surveillance of injectable contraceptive: Secondary | ICD-10-CM

## 2017-04-11 DIAGNOSIS — N938 Other specified abnormal uterine and vaginal bleeding: Secondary | ICD-10-CM

## 2017-04-11 DIAGNOSIS — T839XXA Unspecified complication of genitourinary prosthetic device, implant and graft, initial encounter: Secondary | ICD-10-CM | POA: Diagnosis not present

## 2017-04-11 MED ORDER — MEDROXYPROGESTERONE ACETATE 150 MG/ML IM SUSP
150.0000 mg | Freq: Once | INTRAMUSCULAR | Status: AC
  Administered 2017-04-11: 150 mg via INTRAMUSCULAR

## 2017-04-11 NOTE — Progress Notes (Signed)
Rae LipsLaura Baird is a 41 y.o. year old 73P0 Caucasian female who presents for removal of a Mirena IUD. Her Mirena IUD was placed 09/03/15 for contraception and DUB. See previous notes, as patient has reported continuous and at time heavy mensstrual bleeding since insertion.   Patient's last menstrual period was 03/25/2017. BP 112/83   Pulse 79   Wt 164 lb 6.4 oz (74.6 kg)   LMP 03/25/2017   BMI 30.07 kg/m   Time out was performed.  A graves speculum was placed in the vagina.  The cervix was visualized, and the strings were not visible. Attempted to retrieve IUD with retrieval device but unsuccessful. Mild spotting noted.  Depo given as planned and will stop OCPs today. Sent to hospital for pelvic u/s and labs obtained. F/U after pelvic ultrasound.  Melody Suzan NailerN Shambley, CNM

## 2017-04-14 ENCOUNTER — Other Ambulatory Visit: Payer: Self-pay | Admitting: Obstetrics and Gynecology

## 2017-04-14 ENCOUNTER — Ambulatory Visit
Admission: RE | Admit: 2017-04-14 | Discharge: 2017-04-14 | Disposition: A | Discharge status: Home / Self Care | Payer: BLUE CROSS/BLUE SHIELD | Source: Ambulatory Visit | Attending: Obstetrics and Gynecology | Admitting: Obstetrics and Gynecology

## 2017-04-14 DIAGNOSIS — T839XXA Unspecified complication of genitourinary prosthetic device, implant and graft, initial encounter: Secondary | ICD-10-CM

## 2017-04-14 DIAGNOSIS — N938 Other specified abnormal uterine and vaginal bleeding: Secondary | ICD-10-CM | POA: Diagnosis not present

## 2017-04-14 DIAGNOSIS — N83292 Other ovarian cyst, left side: Secondary | ICD-10-CM | POA: Diagnosis not present

## 2017-04-15 LAB — THYROID PANEL WITH TSH
FREE THYROXINE INDEX: 1.9 (ref 1.2–4.9)
T3 UPTAKE RATIO: 22 % — AB (ref 24–39)
T4 TOTAL: 8.8 ug/dL (ref 4.5–12.0)
TSH: 2.67 u[IU]/mL (ref 0.450–4.500)

## 2017-04-15 LAB — CBC
HEMATOCRIT: 41.3 % (ref 34.0–46.6)
HEMOGLOBIN: 14.3 g/dL (ref 11.1–15.9)
MCH: 30.6 pg (ref 26.6–33.0)
MCHC: 34.6 g/dL (ref 31.5–35.7)
MCV: 88 fL (ref 79–97)
Platelets: 246 10*3/uL (ref 150–379)
RBC: 4.68 x10E6/uL (ref 3.77–5.28)
RDW: 13.6 % (ref 12.3–15.4)
WBC: 5.3 10*3/uL (ref 3.4–10.8)

## 2017-04-15 LAB — COMPREHENSIVE METABOLIC PANEL
ALBUMIN: 4.4 g/dL (ref 3.5–5.5)
ALK PHOS: 40 IU/L (ref 39–117)
ALT: 11 IU/L (ref 0–32)
AST: 15 IU/L (ref 0–40)
Albumin/Globulin Ratio: 1.8 (ref 1.2–2.2)
BILIRUBIN TOTAL: 0.2 mg/dL (ref 0.0–1.2)
BUN / CREAT RATIO: 17 (ref 9–23)
BUN: 16 mg/dL (ref 6–24)
CHLORIDE: 109 mmol/L — AB (ref 96–106)
CO2: 19 mmol/L — ABNORMAL LOW (ref 20–29)
Calcium: 9.8 mg/dL (ref 8.7–10.2)
Creatinine, Ser: 0.96 mg/dL (ref 0.57–1.00)
GFR calc non Af Amer: 74 mL/min/{1.73_m2} (ref >59)
GFR, EST AFRICAN AMERICAN: 86 mL/min/{1.73_m2} (ref >59)
GLUCOSE: 89 mg/dL (ref 65–99)
Globulin, Total: 2.5 g/dL (ref 1.5–4.5)
POTASSIUM: 4.9 mmol/L (ref 3.5–5.2)
Sodium: 144 mmol/L (ref 134–144)
TOTAL PROTEIN: 6.9 g/dL (ref 6.0–8.5)

## 2017-04-15 LAB — FERRITIN: FERRITIN: 95 ng/mL (ref 15–150)

## 2017-04-15 LAB — PROTIME-INR
INR: 1 (ref 0.8–1.2)
PROTHROMBIN TIME: 9.9 s (ref 9.1–12.0)

## 2017-04-15 LAB — APTT: APTT: 26 s (ref 24–33)

## 2017-04-15 LAB — B12 AND FOLATE PANEL
Folate: 12.1 ng/mL (ref >3.0)
VITAMIN B 12: 189 pg/mL — AB (ref 232–1245)

## 2017-04-15 LAB — FACTOR 5 LEIDEN

## 2017-04-16 ENCOUNTER — Other Ambulatory Visit: Payer: Self-pay | Admitting: Obstetrics and Gynecology

## 2017-04-16 DIAGNOSIS — T839XXD Unspecified complication of genitourinary prosthetic device, implant and graft, subsequent encounter: Secondary | ICD-10-CM

## 2017-04-16 DIAGNOSIS — E538 Deficiency of other specified B group vitamins: Secondary | ICD-10-CM

## 2017-04-16 MED ORDER — CYANOCOBALAMIN 1000 MCG/ML IJ SOLN: 1000.0000 ug | INTRAMUSCULAR | 1 refills | Status: DC

## 2017-04-17 ENCOUNTER — Other Ambulatory Visit: Payer: Self-pay | Admitting: Family Medicine

## 2017-04-17 DIAGNOSIS — R51 Headache: Secondary | ICD-10-CM | POA: Diagnosis not present

## 2017-04-17 DIAGNOSIS — G43001 Migraine without aura, not intractable, with status migrainosus: Secondary | ICD-10-CM | POA: Diagnosis not present

## 2017-04-17 DIAGNOSIS — G479 Sleep disorder, unspecified: Secondary | ICD-10-CM | POA: Diagnosis not present

## 2017-04-18 ENCOUNTER — Ambulatory Visit: Payer: Self-pay

## 2017-04-19 ENCOUNTER — Telehealth: Payer: Self-pay | Admitting: Obstetrics and Gynecology

## 2017-04-19 NOTE — Telephone Encounter (Signed)
Patient called and and stated that she missed a call from Amy, and would like a call back. No other information was disclosed. Please advise.

## 2017-04-20 ENCOUNTER — Ambulatory Visit
Admission: RE | Admit: 2017-04-20 | Discharge: 2017-04-20 | Disposition: A | Discharge status: Home / Self Care | Payer: BLUE CROSS/BLUE SHIELD | Source: Ambulatory Visit | Attending: Obstetrics and Gynecology | Admitting: Obstetrics and Gynecology

## 2017-04-20 ENCOUNTER — Other Ambulatory Visit: Payer: Self-pay | Admitting: Obstetrics and Gynecology

## 2017-04-20 ENCOUNTER — Telehealth: Payer: Self-pay | Admitting: Obstetrics and Gynecology

## 2017-04-20 DIAGNOSIS — T839XXD Unspecified complication of genitourinary prosthetic device, implant and graft, subsequent encounter: Secondary | ICD-10-CM | POA: Insufficient documentation

## 2017-04-20 DIAGNOSIS — Z30431 Encounter for routine checking of intrauterine contraceptive device: Secondary | ICD-10-CM | POA: Insufficient documentation

## 2017-04-20 NOTE — Telephone Encounter (Signed)
Spoke with pt -ac 

## 2017-04-20 NOTE — Telephone Encounter (Signed)
Mia Thompson spoke with pt

## 2017-04-20 NOTE — Telephone Encounter (Signed)
Patient called and stated that she would like for Amy to call her when the X-ray results are received. The patient would like to be called at her job. No other information was disclosed, Please advise.

## 2017-04-24 ENCOUNTER — Telehealth: Payer: Self-pay | Admitting: Obstetrics and Gynecology

## 2017-04-24 NOTE — Telephone Encounter (Signed)
Patient called with questions regarding an upcoming appointment. Thanks

## 2017-04-26 NOTE — Telephone Encounter (Signed)
Spoke with pt

## 2017-05-02 ENCOUNTER — Encounter: Payer: Self-pay | Admitting: Obstetrics and Gynecology

## 2017-05-02 ENCOUNTER — Ambulatory Visit (INDEPENDENT_AMBULATORY_CARE_PROVIDER_SITE_OTHER): Payer: BLUE CROSS/BLUE SHIELD | Admitting: Obstetrics and Gynecology

## 2017-05-02 VITALS — BP 123/74 | HR 90 | Ht 62.0 in | Wt 164.4 lb

## 2017-05-02 DIAGNOSIS — T8332XS Displacement of intrauterine contraceptive device, sequela: Secondary | ICD-10-CM

## 2017-05-02 DIAGNOSIS — T8389XS Other specified complication of genitourinary prosthetic devices, implants and grafts, sequela: Secondary | ICD-10-CM | POA: Diagnosis not present

## 2017-05-02 DIAGNOSIS — N939 Abnormal uterine and vaginal bleeding, unspecified: Secondary | ICD-10-CM | POA: Diagnosis not present

## 2017-05-02 DIAGNOSIS — R102 Pelvic and perineal pain: Secondary | ICD-10-CM

## 2017-05-02 NOTE — H&P (Signed)
GYNECOLOGY PREOPERATIVE HISTORY AND PHYSICAL   Subjective:  Mia Thompson is a 41 y.o. G3P0 here for surgical management of perforated IUD with migration. . No significant preoperative concerns.  Proposed surgery: Laparoscopic IUD retrieval    Pertinent Gynecological History: Menses: heavy and irregular Last mammogram: has never had a mammogram  Last pap: normal Date: 08/2015   Past Medical History:  Diagnosis Date  . Anxiety   . Depression   . GERD (gastroesophageal reflux disease)   . Migraines   . Migraines    sees Neuro  . Prolapse of female bladder, acquired    Past Surgical History:  Procedure Laterality Date  . WISDOM TOOTH EXTRACTION     OB History  Gravida Para Term Preterm AB Living  3         3  SAB TAB Ectopic Multiple Live Births          3    # Outcome Date GA Lbr Len/2nd Weight Sex Delivery Anes PTL Lv  3 Gravida 2011    M Vag-Spont   LIV  2 Gravida 2005    F Vag-Spont   LIV  1 Gravida 2002    F Vag-Spont   LIV    Patient denies any other pertinent gynecologic issues.  Family History  Problem Relation Age of Onset  . Adopted: Yes   Social History   Social History  . Marital status: Married    Spouse name: N/A  . Number of children: N/A  . Years of education: N/A   Occupational History  . Not on file.   Social History Main Topics  . Smoking status: Never Smoker  . Smokeless tobacco: Never Used  . Alcohol use Yes     Comment: occas  . Drug use: No  . Sexual activity: Yes    Birth control/ protection: IUD     Comment: mirena   Other Topics Concern  . Not on file   Social History Narrative  . No narrative on file   Current Outpatient Prescriptions on File Prior to Visit  Medication Sig Dispense Refill  . baclofen (LIORESAL) 10 MG tablet Take 1 tablet (10 mg total) by mouth 2 (two) times daily as needed. 60 each 3  . cyanocobalamin (,VITAMIN B-12,) 1000 MCG/ML injection Inject 1 mL (1,000 mcg total) into the muscle every 30  (thirty) days. 10 mL 1  . fluticasone (FLONASE) 50 MCG/ACT nasal spray Place 2 sprays into both nostrils daily. 16 g 3  . levonorgestrel (MIRENA) 20 MCG/24HR IUD 1 each by Intrauterine route once.    . Loratadine (CLARITIN) 10 MG CAPS Take by mouth.    . medroxyPROGESTERone (DEPO-PROVERA) 150 MG/ML injection Inject 1 mL (150 mg total) into the muscle every 3 (three) months. 1 mL 4  . montelukast (SINGULAIR) 10 MG tablet Take 1 tablet (10 mg total) by mouth at bedtime. 90 tablet 3  . Multiple Vitamin (MULTI-VITAMINS) TABS Take by mouth daily.    . promethazine (PHENERGAN) 25 MG tablet Take 1 tablet (25 mg total) by mouth every 8 (eight) hours as needed for nausea or vomiting. 20 tablet 0  . rizatriptan (MAXALT) 10 MG tablet Take 1 tablet (10 mg total) by mouth as needed for migraine. May repeat in 2 hours if needed 10 tablet 0  . topiramate (TOPAMAX) 100 MG tablet Take 100 mg by mouth 2 (two) times daily.    Marland Kitchen topiramate (TOPAMAX) 100 MG tablet Take 1 tablet (100 mg total) by mouth  at bedtime. 90 tablet 3   No current facility-administered medications on file prior to visit.    Allergies  Allergen Reactions  . Vantin [Cefpodoxime] Rash    Shortness of breath      Review of Systems Constitutional: No recent fever/chills/sweats Respiratory: No recent cough/bronchitis Cardiovascular: No chest pain Gastrointestinal: No recent nausea/vomiting/diarrhea.  Positive for excessive gas and bloating.  Genitourinary: No UTI symptoms.  Positive for Intermittent pelvic discomfort.  Hematologic/lymphatic:No history of coagulopathy or recent blood thinner use    Objective:   Blood pressure 123/74, pulse 90, height  (1.575 m), weight 164 lb 6.4 oz (74.6 kg). CONSTITUTIONAL: Well-developed, well-nourished female in no acute distress.  HENT:  Normocephalic, atraumatic, External right and left ear normal. Oropharynx is clear and moist EYES: Conjunctivae and EOM are normal. Pupils are equal, round,  and reactive to light. No scleral icterus.  NECK: Normal range of motion, supple, no masses SKIN: Skin is warm and dry. No rash noted. Not diaphoretic. No erythema. No pallor. NEUROLOGIC: Alert and oriented to person, place, and time. Normal reflexes, muscle tone coordination. No cranial nerve deficit noted. PSYCHIATRIC: Normal mood and affect. Normal behavior. Normal judgment and thought content. CARDIOVASCULAR: Normal heart rate noted, regular rhythm RESPIRATORY: Effort and breath sounds normal, no problems with respiration noted ABDOMEN: Soft, nontender, nondistended. PELVIC: Deferred MUSCULOSKELETAL: Normal range of motion. No edema and no tenderness. 2+ distal pulses.    Labs: No results found for this or any previous visit (from the past 336 hour(s)).   Imaging Studies: Dg Pelvis 1-2 Views  Result Date: 04/20/2017 CLINICAL DATA:  Assess positioning of the IUD. On physical examination last week the string was not visible. EXAM: PELVIS - 1-2 VIEW COMPARISON:  Pelvic ultrasound of April 14, 2017 FINDINGS: The IUD is located to the left of midline just inferior to the left aspect of the sacrum. The bowel gas pattern is normal. The bony structures are unremarkable. IMPRESSION: The IUD is present to the left of midline within the pelvis. If further imaging is felt indicated for localization purposes, pelvic CT scanning would be a useful next imaging step. Electronically Signed   By: David  [place] M.D.   On: 04/20/2017 10:07   US Pelvis Transvanginal Non-ob (tv Only)  Result Date: 04/14/2017 CLINICAL DATA:  Initial evaluation for IUD and dysfunctional uterine bleeding. EXAM: TRANSABDOMINAL AND TRANSVAGINAL ULTRASOUND OF PELVIS TECHNIQUE: Both transabdominal and transvaginal ultrasound examinations of the pelvis were performed. Transabdominal technique was performed for global imaging of the pelvis including uterus, ovaries, adnexal regions, and pelvic cul-de-sac. It was necessary to proceed  with endovaginal exam following the transabdominal exam to visualize the uterus and ovaries. COMPARISON:  None FINDINGS: Uterus Measurements: 7.1 x 3.0 x 3.8 cm. No fibroids or other mass visualized. Tiny 1 mm echogenic focus at the lower uterine segment noted, likely a small calcification. This is of doubtful significance. Endometrium Thickness: 4.2 mm. No focal abnormality visualized. IUD not visualize within the endometrial canal. Right ovary Measurements: 3.2 x 1.9 x 2.2 cm. Complex cystic lesion measuring 2.0 x 1.6 x 1.7 cm with internal lace-like architecture, most consistent with a probable hemorrhagic cyst. Left ovary Measurements: 2.8 x 1.5 x 1.3 cm. Normal appearance/no adnexal mass. Other findings No abnormal free fluid. IMPRESSION: 1. IUD not visualized within the endometrial canal. Follow-up examination with plain film radiograph of the pelvis could be performed for confirmation purposes as clinically desired. 2. Normal sonographic appearance of the endometrium, measuring 4.2 mm in thickness  without focal abnormality. 3. 2 cm complex right ovarian cyst, most consistent with a hemorrhagic cyst. This is almost certainly benign, with no follow-up imaging recommended. This recommendation follows the consensus statement: Management of Asymptomatic Ovarian and Other Adnexal Cysts Imaged at [place]: Society of Radiologists in Ultrasound Consensus Conference Statement. Radiology 2010; [phone]. Electronically Signed   By: Rise Mu M.D.   On: 04/14/2017 17:53   US Pelvis (transabdominal Only)  Result Date: 04/14/2017 CLINICAL DATA:  Initial evaluation for IUD and dysfunctional uterine bleeding. EXAM: TRANSABDOMINAL AND TRANSVAGINAL ULTRASOUND OF PELVIS TECHNIQUE: Both transabdominal and transvaginal ultrasound examinations of the pelvis were performed. Transabdominal technique was performed for global imaging of the pelvis including uterus, ovaries, adnexal regions, and pelvic cul-de-sac. It was  necessary to proceed with endovaginal exam following the transabdominal exam to visualize the uterus and ovaries. COMPARISON:  None FINDINGS: Uterus Measurements: 7.1 x 3.0 x 3.8 cm. No fibroids or other mass visualized. Tiny 1 mm echogenic focus at the lower uterine segment noted, likely a small calcification. This is of doubtful significance. Endometrium Thickness: 4.2 mm. No focal abnormality visualized. IUD not visualize within the endometrial canal. Right ovary Measurements: 3.2 x 1.9 x 2.2 cm. Complex cystic lesion measuring 2.0 x 1.6 x 1.7 cm with internal lace-like architecture, most consistent with a probable hemorrhagic cyst. Left ovary Measurements: 2.8 x 1.5 x 1.3 cm. Normal appearance/no adnexal mass. Other findings No abnormal free fluid. IMPRESSION: 1. IUD not visualized within the endometrial canal. Follow-up examination with plain film radiograph of the pelvis could be performed for confirmation purposes as clinically desired. 2. Normal sonographic appearance of the endometrium, measuring 4.2 mm in thickness without focal abnormality. 3. 2 cm complex right ovarian cyst, most consistent with a hemorrhagic cyst. This is almost certainly benign, with no follow-up imaging recommended. This recommendation follows the consensus statement: Management of Asymptomatic Ovarian and Other Adnexal Cysts Imaged at [place]: Society of Radiologists in Ultrasound Consensus Conference Statement. Radiology 2010; [phone]. Electronically Signed   By: Rise Mu M.D.   On: 04/14/2017 17:53    Assessment:      Perforated IUD (with migration)      Plan:    Counseling: Procedure, risks, reasons, benefits and complications (including injury to bowel, bladder, major blood vessel, ureter, bleeding, possibility of transfusion, infection, or fistula formation) reviewed in detail. Likelihood of success in alleviating the patient's condition was discussed. Routine postoperative instructions will be reviewed with  the patient and her family in detail after surgery.  The patient concurred with the proposed plan, giving informed written consent for the surgery.  Surgery scheduled for 05/08/2017.  Preop testing ordered. Instructions reviewed, including NPO after midnight.    Hildred Laser, MD [facility]

## 2017-05-02 NOTE — Progress Notes (Signed)
GYNECOLOGY PROGRESS NOTE  Subjective:    Patient ID: Mia Thompson, female    DOB: 1976/04/02, 41 y.o.   MRN: [id number]  HPI  Patient is a 41 y.o. G27P0 female who presents for surgical consultation.  Patient was referred from midwifery service [facility] [place], [place]) due to complaints of pelvic pain, abnormal bleeding, and IUD perforation and migration.  Patient notes that she has had the IUD in place for ~ 1.5 years.  This is her third IUD.  States that since insertion of most recent IUD she has had irregular and sometimes prolonged episodes of bleeding.    The following portions of the patient's history were reviewed and updated as appropriate: allergies, current medications, past family history, past medical history, past social history, past surgical history and problem list.  Review of Systems A comprehensive review of systems was negative except for: Genitourinary: positive for feeling as though her bladder had dropped. Notes that she feels a sensation of something in the pelvis. Denies leakage of urine.    Objective:   Blood pressure 123/74, pulse 90, height  (1.575 m), weight 164 lb 6.4 oz (74.6 kg). General appearance: alert and no distress  Abdomen: bowel sounds present, non-tender, no masses or organomegaly Pelvis: deferred See H&P for full physical exam details.    Imaging:  CLINICAL DATA:  Initial evaluation for IUD and dysfunctional uterine bleeding.  EXAM (04/14/2017): TRANSABDOMINAL AND TRANSVAGINAL ULTRASOUND OF PELVIS  TECHNIQUE: Both transabdominal and transvaginal ultrasound examinations of the pelvis were performed. Transabdominal technique was performed for global imaging of the pelvis including uterus, ovaries, adnexal regions, and pelvic cul-de-sac. It was necessary to proceed with endovaginal exam following the transabdominal exam to visualize the uterus and ovaries.  COMPARISON:  None  FINDINGS: Uterus  Measurements: 7.1 x 3.0 x 3.8 cm.  No fibroids or other mass visualized. Tiny 1 mm echogenic focus at the lower uterine segment noted, likely a small calcification. This is of doubtful significance.  Endometrium  Thickness: 4.2 mm. No focal abnormality visualized. IUD not visualize within the endometrial canal.  Right ovary  Measurements: 3.2 x 1.9 x 2.2 cm. Complex cystic lesion measuring 2.0 x 1.6 x 1.7 cm with internal lace-like architecture, most consistent with a probable hemorrhagic cyst.  Left ovary  Measurements: 2.8 x 1.5 x 1.3 cm. Normal appearance/no adnexal mass.  Other findings  No abnormal free fluid.  IMPRESSION: 1. IUD not visualized within the endometrial canal. Follow-up examination with plain film radiograph of the pelvis could be performed for confirmation purposes as clinically desired. 2. Normal sonographic appearance of the endometrium, measuring 4.2 mm in thickness without focal abnormality. 3. 2 cm complex right ovarian cyst, most consistent with a hemorrhagic cyst. This is almost certainly benign, with no follow-up imaging recommended. This recommendation follows the consensus statement: Management of Asymptomatic Ovarian and Other Adnexal Cysts Imaged at [place]: Society of Radiologists in Ultrasound Consensus Conference Statement. Radiology 2010; [phone].     CLINICAL DATA:  Assess positioning of the IUD. On physical examination last week the string was not visible.  EXAM: PELVIS - 1-2 VIEW  COMPARISON:  Pelvic ultrasound of April 14, 2017  FINDINGS: The IUD is located to the left of midline just inferior to the left aspect of the sacrum. The bowel gas pattern is normal. The bony structures are unremarkable.  IMPRESSION: The IUD is present to the left of midline within the pelvis. If further imaging is felt indicated for localization purposes, pelvic CT scanning  would be a useful next imaging step.  Assessment:   IUD migration/perforation Pelvic  pain Abnormal uterine bleeding Pelvic pressure (possible cystocele)  Plan:   - Patient for consultation for surgical management to remove intrabdominal IUD.  The risks of surgery were discussed in detail with the patient including but not limited to: bleeding which may require transfusion or reoperation; infection which may require prolonged hospitalization or re-hospitalization and antibiotic therapy; injury to bowel, bladder, ureters and major vessels or other surrounding organs; need for additional procedures including laparotomy; thromboembolic phenomenon, incisional problems and other postoperative or anesthesia complications.  Patient was told that the likelihood that her condition and symptoms will be treated effectively with this surgical management was very high; the postoperative expectations were also discussed in detail.  All questions were answered.  She was told that she will be contacted by our surgical scheduler regarding the time and date of her surgery; routine preoperative instructions of having nothing to eat or drink after midnight on the day prior to surgery and also coming to the hospital 1.5 hours prior to her time of surgery were also emphasized.  She was told she will be called for a preoperative appointment this week prior to surgery and will be given further preoperative instructions at that visit. Printed patient education handouts about the procedure were given to the patient to review at home.  Surgery scheduled for 05/08/2017.  - Patient complains of "bladder drop".  Denies urinary leakage. Will evaluate further while patient is under anesthesia to assess for cystocele. Discussed possible management options if cystocele was diagnosed, depending on the severity of the prolapse, including expectant management (Kegel's, pelvic floor physical therapy), pessary, or surgical management with surgery if moderate to severe prolapse noted.  Patient doing Kegel exercises, however  infrequently.  Discussed appropriate frequency to be effective.   A total of 25 minutes were spent face-to-face with the patient during this encounter and over half of that time involved counseling and coordination of care.   Hildred Laser, MD [facility]

## 2017-05-02 NOTE — Patient Instructions (Addendum)
Diagnostic Laparoscopy  A diagnostic laparoscopy is a procedure to diagnose diseases in the abdomen. During the procedure, a thin, lighted, pencil-sized instrument called a laparoscope is inserted into the abdomen through an incision. The laparoscope allows your health care provider to look at the organs inside your body.  Tell a health care provider about:   Any allergies you have.   All medicines you are taking, including vitamins, herbs, eye drops, creams, and over-the-counter medicines.   Any problems you or family members have had with anesthetic medicines.   Any blood disorders you have.   Any surgeries you have had.   Any medical conditions you have.  What are the risks?  Generally, this is a safe procedure. However, problems can occur, which may include:   Infection.   Bleeding.   Damage to other organs.   Allergic reaction to the anesthetics used during the procedure.    What happens before the procedure?   Do not eat or drink anything after midnight on the night before the procedure or as directed by your health care provider.   Ask your health care provider about:  ? Changing or stopping your regular medicines.  ? Taking medicines such as aspirin and ibuprofen. These medicines can thin your blood. Do not take these medicines before your procedure if your health care provider instructs you not to.   Plan to have someone take you home after the procedure.  What happens during the procedure?   You may be given a medicine to help you relax (sedative).   You will be given a medicine to make you sleep (general anesthetic).   Your abdomen will be inflated with a gas. This will make your organs easier to see.   Small incisions will be made in your abdomen.   A laparoscope and other small instruments will be inserted into the abdomen through the incisions.   A tissue sample may be removed from an organ in the abdomen for examination.   The instruments will be removed from the abdomen.   The  gas will be released.   The incisions will be closed with stitches (sutures).  What happens after the procedure?  Your blood pressure, heart rate, breathing rate, and blood oxygen level will be monitored often until the medicines you were given have worn off.  This information is not intended to replace advice given to you by your health care provider. Make sure you discuss any questions you have with your health care provider.  Document Released: 10/31/2000 Document Revised: 12/03/2015 Document Reviewed: 03/07/2014  Elsevier Interactive Patient Education  2018 [employer]

## 2017-05-03 DIAGNOSIS — G4719 Other hypersomnia: Secondary | ICD-10-CM | POA: Diagnosis not present

## 2017-05-03 DIAGNOSIS — R5383 Other fatigue: Secondary | ICD-10-CM | POA: Diagnosis not present

## 2017-05-03 DIAGNOSIS — R51 Headache: Secondary | ICD-10-CM | POA: Diagnosis not present

## 2017-05-03 DIAGNOSIS — G479 Sleep disorder, unspecified: Secondary | ICD-10-CM | POA: Diagnosis not present

## 2017-05-05 ENCOUNTER — Encounter
Admission: RE | Admit: 2017-05-05 | Discharge: 2017-05-05 | Disposition: A | Discharge status: Home / Self Care | Payer: BLUE CROSS/BLUE SHIELD | Source: Ambulatory Visit | Attending: Obstetrics and Gynecology | Admitting: Obstetrics and Gynecology

## 2017-05-05 NOTE — Patient Instructions (Addendum)
Your procedure is scheduled on: Monday, May 08, 2017 Report to Same Day Surgery on the 2nd floor in the [facility] at 11:00 am.  REMEMBER: Instructions that are not followed completely may result in serious medical risk up to and including death; or upon the discretion of your surgeon and anesthesiologist your surgery may need to be rescheduled.  Do not eat food or drink liquids after midnight. No gum chewing or hard candies.  You may however, drink CLEAR liquids up to 2 hours before you are scheduled to arrive at the hospital for your procedure.  Do not drink clear liquids within 2 hours of your scheduled arrival to the hospital as this may lead to your procedure being delayed or rescheduled.  Clear liquids include: - water  - apple juice without pulp - clear gatorade - black coffee or tea (NO milk, creamers, sugars) DO NOT drink anything not on this list.  No Alcohol for 24 hours before or after surgery.  No Smoking for 24 hours prior to surgery.  Notify your doctor if there is any change in your medical condition (cold, fever, infection).  Do not wear jewelry, make-up, hairpins, clips or nail polish.  Do not wear lotions, powders, or perfumes.   Do not shave 48 hours prior to surgery. Men may shave face and neck.  Contacts and dentures may not be worn into surgery.  Do not bring valuables to the hospital. [facility] is not responsible for any belongings or valuables.   TAKE THESE MEDICATIONS THE MORNING OF SURGERY WITH A SIP OF WATER:  none  Stop Anti-inflammatories such as Advil, Aleve, Ibuprofen, Motrin, Naproxen, Naprosyn, Goodie powder, or aspirin products. (May take Tylenol or Acetaminophen if needed.)  Stop over the counter supplements until after surgery. (May continue multivitamin.)  If you are being discharged the day of surgery, you will not be allowed to drive home. You will need someone to drive you home and stay with you that night.   If you are  taking public transportation, you will need to have a responsible adult to with you.  Please call the number above if you have any questions about these instructions.

## 2017-05-08 ENCOUNTER — Ambulatory Visit: Payer: BLUE CROSS/BLUE SHIELD | Admitting: Certified Registered Nurse Anesthetist

## 2017-05-08 ENCOUNTER — Ambulatory Visit
Admission: RE | Admit: 2017-05-08 | Discharge: 2017-05-08 | Disposition: A | Discharge status: Home / Self Care | Payer: BLUE CROSS/BLUE SHIELD | Source: Ambulatory Visit | Attending: Obstetrics and Gynecology | Admitting: Obstetrics and Gynecology

## 2017-05-08 ENCOUNTER — Encounter
Admission: RE | Disposition: A | Discharge status: Home / Self Care | Payer: Self-pay | Source: Ambulatory Visit | Attending: Obstetrics and Gynecology

## 2017-05-08 ENCOUNTER — Encounter: Payer: Self-pay | Admitting: *Deleted

## 2017-05-08 DIAGNOSIS — K219 Gastro-esophageal reflux disease without esophagitis: Secondary | ICD-10-CM | POA: Insufficient documentation

## 2017-05-08 DIAGNOSIS — G43909 Migraine, unspecified, not intractable, without status migrainosus: Secondary | ICD-10-CM | POA: Insufficient documentation

## 2017-05-08 DIAGNOSIS — Z793 Long term (current) use of hormonal contraceptives: Secondary | ICD-10-CM | POA: Insufficient documentation

## 2017-05-08 DIAGNOSIS — T8332XA Displacement of intrauterine contraceptive device, initial encounter: Secondary | ICD-10-CM

## 2017-05-08 DIAGNOSIS — Z79899 Other long term (current) drug therapy: Secondary | ICD-10-CM | POA: Diagnosis not present

## 2017-05-08 DIAGNOSIS — Z881 Allergy status to other antibiotic agents status: Secondary | ICD-10-CM | POA: Insufficient documentation

## 2017-05-08 DIAGNOSIS — F329 Major depressive disorder, single episode, unspecified: Secondary | ICD-10-CM | POA: Insufficient documentation

## 2017-05-08 DIAGNOSIS — N8110 Cystocele, unspecified: Secondary | ICD-10-CM | POA: Insufficient documentation

## 2017-05-08 DIAGNOSIS — Z7951 Long term (current) use of inhaled steroids: Secondary | ICD-10-CM | POA: Diagnosis not present

## 2017-05-08 DIAGNOSIS — R102 Pelvic and perineal pain: Secondary | ICD-10-CM | POA: Diagnosis not present

## 2017-05-08 DIAGNOSIS — N938 Other specified abnormal uterine and vaginal bleeding: Secondary | ICD-10-CM | POA: Insufficient documentation

## 2017-05-08 DIAGNOSIS — Z9889 Other specified postprocedural states: Secondary | ICD-10-CM | POA: Diagnosis not present

## 2017-05-08 DIAGNOSIS — N736 Female pelvic peritoneal adhesions (postinfective): Secondary | ICD-10-CM | POA: Insufficient documentation

## 2017-05-08 DIAGNOSIS — Z30432 Encounter for removal of intrauterine contraceptive device: Secondary | ICD-10-CM | POA: Diagnosis present

## 2017-05-08 DIAGNOSIS — Y768 Miscellaneous obstetric and gynecological devices associated with adverse incidents, not elsewhere classified: Secondary | ICD-10-CM | POA: Insufficient documentation

## 2017-05-08 HISTORY — PX: LAPAROSCOPY: SHX197

## 2017-05-08 HISTORY — PX: IUD REMOVAL: SHX5392

## 2017-05-08 HISTORY — PX: LAPAROSCOPIC LYSIS OF ADHESIONS: SHX5905

## 2017-05-08 SURGERY — LAPAROSCOPY OPERATIVE
Anesthesia: General | Wound class: Clean Contaminated

## 2017-05-08 MED ORDER — LACTATED RINGERS IV SOLN
INTRAVENOUS | Status: DC
  Administered 2017-05-08: 12:00:00 via INTRAVENOUS

## 2017-05-08 MED ORDER — LIDOCAINE HCL (CARDIAC) 20 MG/ML IV SOLN
INTRAVENOUS | Status: DC | PRN
Start: 2017-05-08 — End: 2017-05-08
  Administered 2017-05-08: 80 mg via INTRAVENOUS

## 2017-05-08 MED ORDER — DEXAMETHASONE SODIUM PHOSPHATE 10 MG/ML IJ SOLN
INTRAMUSCULAR | Status: AC
  Filled 2017-05-08: qty 1

## 2017-05-08 MED ORDER — ONDANSETRON HCL 4 MG/2ML IJ SOLN: 4.0000 mg | Freq: Once | INTRAMUSCULAR | Status: DC | PRN

## 2017-05-08 MED ORDER — FENTANYL CITRATE (PF) 100 MCG/2ML IJ SOLN
25.0000 ug | INTRAMUSCULAR | Status: DC | PRN
  Administered 2017-05-08 (×2): 25 ug via INTRAVENOUS

## 2017-05-08 MED ORDER — PROPOFOL 10 MG/ML IV BOLUS
INTRAVENOUS | Status: AC
Start: 2017-05-08 — End: ?
  Filled 2017-05-08: qty 20

## 2017-05-08 MED ORDER — KETOROLAC TROMETHAMINE 30 MG/ML IJ SOLN
INTRAMUSCULAR | Status: AC
  Filled 2017-05-08: qty 1

## 2017-05-08 MED ORDER — ACETAMINOPHEN 10 MG/ML IV SOLN
INTRAVENOUS | Status: AC
  Filled 2017-05-08: qty 100

## 2017-05-08 MED ORDER — SUGAMMADEX SODIUM 200 MG/2ML IV SOLN
INTRAVENOUS | Status: AC
  Filled 2017-05-08: qty 2

## 2017-05-08 MED ORDER — ONDANSETRON HCL 4 MG/2ML IJ SOLN
INTRAMUSCULAR | Status: AC
  Filled 2017-05-08: qty 2

## 2017-05-08 MED ORDER — IBUPROFEN 800 MG PO TABS: 800.0000 mg | ORAL_TABLET | Freq: Three times a day (TID) | ORAL | 1 refills | Status: DC | PRN

## 2017-05-08 MED ORDER — SIMETHICONE 80 MG PO CHEW: 80.0000 mg | CHEWABLE_TABLET | Freq: Four times a day (QID) | ORAL | 1 refills | Status: DC | PRN

## 2017-05-08 MED ORDER — FAMOTIDINE 20 MG PO TABS
20.0000 mg | ORAL_TABLET | Freq: Once | ORAL | Status: AC
  Administered 2017-05-08: 20 mg via ORAL

## 2017-05-08 MED ORDER — KETOROLAC TROMETHAMINE 30 MG/ML IJ SOLN
INTRAMUSCULAR | Status: DC | PRN
  Administered 2017-05-08: 30 mg via INTRAVENOUS

## 2017-05-08 MED ORDER — FAMOTIDINE 20 MG PO TABS
ORAL_TABLET | ORAL | Status: AC
  Filled 2017-05-08: qty 1

## 2017-05-08 MED ORDER — MIDAZOLAM HCL 2 MG/2ML IJ SOLN
INTRAMUSCULAR | Status: DC | PRN
  Administered 2017-05-08: 1 mg via INTRAVENOUS

## 2017-05-08 MED ORDER — HYDROCODONE-ACETAMINOPHEN 5-325 MG PO TABS: 1.0000 | ORAL_TABLET | Freq: Four times a day (QID) | ORAL | 0 refills | Status: DC | PRN

## 2017-05-08 MED ORDER — FENTANYL CITRATE (PF) 100 MCG/2ML IJ SOLN
INTRAMUSCULAR | Status: DC | PRN
  Administered 2017-05-08 (×2): 50 ug via INTRAVENOUS

## 2017-05-08 MED ORDER — BUPIVACAINE HCL (PF) 0.5 % IJ SOLN
INTRAMUSCULAR | Status: AC
  Filled 2017-05-08: qty 30

## 2017-05-08 MED ORDER — BUPIVACAINE HCL 0.5 % IJ SOLN
INTRAMUSCULAR | Status: DC | PRN
  Administered 2017-05-08: 10 mL

## 2017-05-08 MED ORDER — FENTANYL CITRATE (PF) 100 MCG/2ML IJ SOLN
INTRAMUSCULAR | Status: AC
  Administered 2017-05-08: 25 ug via INTRAVENOUS
  Filled 2017-05-08: qty 2

## 2017-05-08 MED ORDER — LIDOCAINE HCL (PF) 2 % IJ SOLN
INTRAMUSCULAR | Status: AC
  Filled 2017-05-08: qty 4

## 2017-05-08 MED ORDER — PROPOFOL 10 MG/ML IV BOLUS
INTRAVENOUS | Status: DC | PRN
  Administered 2017-05-08: 150 mg via INTRAVENOUS

## 2017-05-08 MED ORDER — GLYCOPYRROLATE 0.2 MG/ML IJ SOLN
INTRAMUSCULAR | Status: AC
  Filled 2017-05-08: qty 1

## 2017-05-08 MED ORDER — ROCURONIUM BROMIDE 100 MG/10ML IV SOLN
INTRAVENOUS | Status: DC | PRN
  Administered 2017-05-08: 30 mg via INTRAVENOUS

## 2017-05-08 MED ORDER — DEXAMETHASONE SODIUM PHOSPHATE 10 MG/ML IJ SOLN
INTRAMUSCULAR | Status: DC | PRN
Start: 2017-05-08 — End: 2017-05-08
  Administered 2017-05-08: 10 mg via INTRAVENOUS

## 2017-05-08 MED ORDER — SUGAMMADEX SODIUM 500 MG/5ML IV SOLN
INTRAVENOUS | Status: DC | PRN
  Administered 2017-05-08: 150 mg via INTRAVENOUS

## 2017-05-08 MED ORDER — ONDANSETRON HCL 4 MG/2ML IJ SOLN
INTRAMUSCULAR | Status: DC | PRN
Start: 2017-05-08 — End: 2017-05-08
  Administered 2017-05-08: 4 mg via INTRAVENOUS

## 2017-05-08 MED ORDER — FENTANYL CITRATE (PF) 100 MCG/2ML IJ SOLN
INTRAMUSCULAR | Status: AC
  Filled 2017-05-08: qty 2

## 2017-05-08 MED ORDER — ROCURONIUM BROMIDE 50 MG/5ML IV SOLN
INTRAVENOUS | Status: AC
  Filled 2017-05-08: qty 1

## 2017-05-08 MED ORDER — MIDAZOLAM HCL 2 MG/2ML IJ SOLN
INTRAMUSCULAR | Status: AC
  Filled 2017-05-08: qty 2

## 2017-05-08 MED ORDER — ACETAMINOPHEN 10 MG/ML IV SOLN
INTRAVENOUS | Status: DC | PRN
  Administered 2017-05-08: 1000 mg via INTRAVENOUS

## 2017-05-08 SURGICAL SUPPLY — 44 items
BLADE SURG SZ11 CARB STEEL (BLADE) ×3 IMPLANT
CANISTER SUCT 1200ML W/VALVE (MISCELLANEOUS) ×3 IMPLANT
CATH ROBINSON RED A/P 16FR (CATHETERS) ×3 IMPLANT
CHLORAPREP W/TINT 26ML (MISCELLANEOUS) ×3 IMPLANT
CORD MONOPOLAR M/FML 12FT (MISCELLANEOUS) IMPLANT
CUP MEDICINE 2OZ PLAST GRAD ST (MISCELLANEOUS) ×3 IMPLANT
DERMABOND ADVANCED (GAUZE/BANDAGES/DRESSINGS) ×1
DERMABOND ADVANCED .7 DNX12 (GAUZE/BANDAGES/DRESSINGS) ×2 IMPLANT
DRAPE UNDER BUTTOCK W/FLU (DRAPES) ×3 IMPLANT
DRSG TELFA 3X8 NADH (GAUZE/BANDAGES/DRESSINGS) ×3 IMPLANT
GLOVE BIO SURGEON STRL SZ 6.5 (GLOVE) ×3 IMPLANT
GLOVE BIO SURGEON STRL SZ8 (GLOVE) ×3 IMPLANT
GLOVE INDICATOR 7.0 STRL GRN (GLOVE) ×3 IMPLANT
GLOVE INDICATOR 8.0 STRL GRN (GLOVE) ×3 IMPLANT
GOWN STRL REUS W/ TWL LRG LVL3 (GOWN DISPOSABLE) ×4 IMPLANT
GOWN STRL REUS W/TWL LRG LVL3 (GOWN DISPOSABLE) ×2
GOWN STRL REUS W/TWL XL LVL4 (GOWN DISPOSABLE) ×3 IMPLANT
IRRIGATION STRYKERFLOW (MISCELLANEOUS) ×2 IMPLANT
IRRIGATOR STRYKERFLOW (MISCELLANEOUS) ×3
IV LACTATED RINGERS 1000ML (IV SOLUTION) ×3 IMPLANT
KIT PINK PAD W/HEAD ARE REST (MISCELLANEOUS) ×3
KIT PINK PAD W/HEAD ARM REST (MISCELLANEOUS) ×2 IMPLANT
KIT RM TURNOVER CYSTO AR (KITS) ×3 IMPLANT
NS IRRIG 500ML POUR BTL (IV SOLUTION) ×3 IMPLANT
PACK DNC HYST (MISCELLANEOUS) ×3 IMPLANT
PACK GYN LAPAROSCOPIC (MISCELLANEOUS) ×3 IMPLANT
PAD OB MATERNITY 4.3X12.25 (PERSONAL CARE ITEMS) ×3 IMPLANT
PAD PREP 24X41 OB/GYN DISP (PERSONAL CARE ITEMS) ×3 IMPLANT
POUCH ENDO CATCH 10MM SPEC (MISCELLANEOUS) IMPLANT
SCISSORS METZENBAUM CVD 33 (INSTRUMENTS) ×3 IMPLANT
SHEARS HARMONIC ACE PLUS 36CM (ENDOMECHANICALS) IMPLANT
SLEEVE ENDOPATH XCEL 5M (ENDOMECHANICALS) ×6 IMPLANT
SOL PREP PVP 2OZ (MISCELLANEOUS) ×3
SOLUTION PREP PVP 2OZ (MISCELLANEOUS) ×2 IMPLANT
SPONGE XRAY 4X4 16PLY STRL (MISCELLANEOUS) ×3 IMPLANT
SUT VIC AB 3-0 SH 27 (SUTURE)
SUT VIC AB 3-0 SH 27X BRD (SUTURE) IMPLANT
SUT VIC AB PLUS 45CM 1-MO-4 (SUTURE) ×3 IMPLANT
SUT VICRYL 0 AB UR-6 (SUTURE) IMPLANT
TOWEL OR 17X26 4PK STRL BLUE (TOWEL DISPOSABLE) ×3 IMPLANT
TROCAR ENDO BLADELESS 11MM (ENDOMECHANICALS) ×3 IMPLANT
TROCAR XCEL NON-BLD 5MMX100MML (ENDOMECHANICALS) ×3 IMPLANT
TROCAR XCEL UNIV SLVE 11M 100M (ENDOMECHANICALS) ×3 IMPLANT
TUBING INSUFFLATOR HI FLOW (MISCELLANEOUS) ×3 IMPLANT

## 2017-05-08 NOTE — Discharge Instructions (Signed)
Diagnostic Laparoscopy, Care After  Refer to this sheet in the next few weeks. These instructions provide you with information about caring for yourself after your procedure. Your health care provider may also give you more specific instructions. Your treatment has been planned according to current medical practices, but problems sometimes occur. Call your health care provider if you have any problems or questions after your procedure.  What can I expect after the procedure?  After your procedure, it is common to have mild discomfort in the throat and abdomen.  Follow these instructions at home:  · Take over-the-counter and prescription medicines only as told by your health care provider.  · Do not drive for 24 hours if you received a sedative.  · Return to your normal activities as told by your health care provider.  · Do not take baths, swim, or use a hot tub until your health care provider approves. You may shower.  · Follow instructions from your health care provider about how to take care of your incision. Make sure you:  ? Wash your hands with soap and water before you change your bandage (dressing). If soap and water are not available, use hand sanitizer.  ? Change your dressing as told by your health care provider.  ? Leave stitches (sutures), skin glue, or adhesive strips in place. These skin closures may need to stay in place for 2 weeks or longer. If adhesive strip edges start to loosen and curl up, you may trim the loose edges. Do not remove adhesive strips completely unless your health care provider tells you to do that.  · Check your incision area every day for signs of infection. Check for:  ? More redness, swelling, or pain.  ? More fluid or blood.  ? Warmth.  ? Pus or a bad smell.  · It is your responsibility to get the results of your procedure. Ask your health care provider or the department performing the procedure when your results will be ready.  Contact a health care provider if:  · There is  new pain in your shoulders.  · You feel light-headed or faint.  · You are unable to pass gas or unable to have a bowel movement.  · You feel nauseous or you vomit.  · You develop a rash.  · You have more redness, swelling, or pain around your incision.  · You have more fluid or blood coming from your incision.  · Your incision feels warm to the touch.  · You have pus or a bad smell coming from your incision.  · You have a fever or chills.  Get help right away if:  · Your pain is getting worse.  · You have ongoing vomiting.  · The edges of your incision open up.  · You have trouble breathing.  · You have chest pain.  This information is not intended to replace advice given to you by your health care provider. Make sure you discuss any questions you have with your health care provider.  Document Released: 07/06/2015 Document Revised: 12/31/2015 Document Reviewed: 04/07/2015  Elsevier Interactive Patient Education © 2018 [employer]

## 2017-05-08 NOTE — Op Note (Addendum)
Procedure(s): LAPAROSCOPY OPERATIVE INTRAUTERINE DEVICE (IUD) REMOVAL LAPAROSCOPIC LYSIS OF ADHESIONS Procedure Note  Mia Thompson female 41 y.o. 05/08/2017  Indications: The patient is a 41 y.o. G28P3003 female with IUD perforation, dysfunctional uterine bleeding  Pre-operative Diagnosis: IUD perforation, dysfunctional uterine bleeding  Post-operative Diagnosis: Same, with pelvic adhesions  Surgeon: Hildred Laser, MD  Assistants: Brennan Bailey, MD  Anesthesia: General endotracheal anesthesia  Findings: The uterus was sounded to 8 cm. Fallopian tubes and ovaries appeared normal.  There was an adhesion of the omentum to the fundus of the uterus. There were filmy adhesions of the omentum and bowel to the right pelvic sidewall.   Procedure Details: The patient was seen in the Holding Room. The risks, benefits, complications, treatment options, and expected outcomes were discussed with the patient.  The patient concurred with the proposed plan, giving informed consent.  The site of surgery properly noted/marked. The patient was taken to the Operating Room, identified as Mia Thompson and the procedure verified as Procedure(s) (LRB): LAPAROSCOPY OPERATIVE (N/A), INTRAUTERINE DEVICE (IUD) REMOVAL (N/A), LAPAROSCOPIC LYSIS OF ADHESIONS. A Time Out was held and the above information confirmed.  She was then placed under general anesthesia without difficulty. She was placed in the dorsal lithotomy position, and was prepped and draped in a sterile manner.  A straight catheterization was performed. A sterile speculum was inserted into the vagina and the cervix was grasped at the anterior lip using a single-toothed tenaculum.  The uterus was sounded to 8 cm, and a Hulka clamp was placed for uterine manipulation.  The speculum and tenaculum were then removed. After an adequate timeout was performed, attention was turned to the abdomen where an umbilical incision was made with the scalpel.  The Optiview  5-mm trocar and sleeve were then advanced without difficulty with the laparoscope under direct visualization into the abdomen.  The abdomen was then insufflated with carbon dioxide gas and adequate pneumoperitoneum was obtained. A 5-mm left lower quadrant port and an 5-mm right lower quadrant port were then placed under direct visualization.  A survey of the patient's pelvis and abdomen revealed the findings as above.  There was an adhesion of the omentum to the fundus of the uterus. This was lysed with the Kleppinger device and cut with cold scissors.  There were also filmy adhesions of the omentum and bowel to the right pelvic sidewall.  These adhesions were also lysed using the Kleppinger device and cut with the cold scissors.   A survey of the patient's abdomen was performed in search of the IUD.  The IUD was identified underneath a loop of large bowel in the lower pelvis, several inches distally and to the left of the sacrum. The IUD was grasped and removed from one of the lateral port sites.    A final survey was performed, where good hemostasis was noted.  All trocars were removed under direct visualization, and the abdomen which was desufflated.    All skin incisions were closed with 4-0 Vicryl figure-of-eight subcuticular stitches.  A total of 10 cc of 0.5% Marcaine was injected into the incisions.  The incisions were then covered with Dermabond. The patient tolerated the procedures well.  All instruments, needles, and sponge counts were correct x 2. The patient was taken to the recovery room awake, extubated and in stable condition.    Estimated Blood Loss:  minimal      Drains: straight catheterization prior to procedure with  30 ml of clear urine  Total IV Fluids:  700 ml of Lactated Ringer's  Specimens: IUD         Implants: None         Complications:  None; patient tolerated the procedure well.         Disposition: PACU - hemodynamically stable.         Condition:  stable   Hildred Laser, MD [facility]

## 2017-05-08 NOTE — Anesthesia Preprocedure Evaluation (Signed)
Anesthesia Evaluation  Patient identified by MRN, date of birth, ID band Patient awake    Reviewed: Allergy & Precautions, H&P , NPO status , Patient's Chart, lab work & pertinent test results, reviewed documented beta blocker date and time   Airway Mallampati: II  TM Distance: >3 FB Neck ROM: full    Dental  (+) Teeth Intact   Pulmonary neg pulmonary ROS, former smoker,    Pulmonary exam normal        Cardiovascular Exercise Tolerance: Good negative cardio ROS Normal cardiovascular exam Rhythm:regular Rate:Normal     Neuro/Psych  Headaches, negative neurological ROS  negative psych ROS   GI/Hepatic negative GI ROS, Neg liver ROS, GERD  Medicated,(+) D  Endo/Other  negative endocrine ROS  Renal/GU negative Renal ROS  negative genitourinary   Musculoskeletal   Abdominal   Peds  Hematology negative hematology ROS (+)   Anesthesia Other Findings Past Medical History: No date: Anxiety No date: Depression No date: GERD (gastroesophageal reflux disease) No date: Migraines No date: Migraines     Comment:  sees Neuro No date: Prolapse of female bladder, acquired Past Surgical History: AGE 41: THUMB SURGERY; Left No date: WISDOM TOOTH EXTRACTION BMI    Body Mass Index:  30.00 kg/m     Reproductive/Obstetrics negative OB ROS                             Anesthesia Physical Anesthesia Plan  ASA: II  Anesthesia Plan: General ETT   Post-op Pain Management:    Induction:   PONV Risk Score and Plan: 4 or greater and Ondansetron, Dexamethasone, Midazolam and Propofol infusion  Airway Management Planned:   Additional Equipment:   Intra-op Plan:   Post-operative Plan:   Informed Consent: I have reviewed the patients History and Physical, chart, labs and discussed the procedure including the risks, benefits and alternatives for the proposed anesthesia with the patient or authorized  representative who has indicated his/her understanding and acceptance.   Dental Advisory Given  Plan Discussed with: CRNA  Anesthesia Plan Comments:         Anesthesia Quick Evaluation

## 2017-05-08 NOTE — H&P (Signed)
UPDATE TO PREVIOUS HISTORY AND PHYSICAL  The patient has been seen and examined.  H&P is up to date, no changes noted.  Mia Thompson 41 y.o. G3P0 here for surgical management of perforated IUD with migration.  Will undergo laparoscopic IUD retrieval.  All questions answered.  Patient can proceed to the OR for scheduled procedure.    Hildred Laser, MD 05/08/2017 11:46 AM

## 2017-05-08 NOTE — Progress Notes (Signed)
No drainage on peripad

## 2017-05-08 NOTE — Transfer of Care (Signed)
Immediate Anesthesia Transfer of Care Note  Patient: Mia Thompson  Procedure(s) Performed: LAPAROSCOPY OPERATIVE (N/A ) INTRAUTERINE DEVICE (IUD) REMOVAL (N/A ) LAPAROSCOPIC LYSIS OF ADHESIONS  Patient Location: PACU  Anesthesia Type:General  Level of Consciousness: sedated  Airway & Oxygen Therapy: Patient Spontanous Breathing and Patient connected to face mask oxygen  Post-op Assessment: Report given to RN and Post -op Vital signs reviewed and stable  Post vital signs: Reviewed and stable  Last Vitals:  Vitals:   05/08/17 1105  BP: (!) 109/56  Pulse: 98  Resp: 18  Temp: 36.8 C  SpO2: 99%    Last Pain:  Vitals:   05/08/17 1105  TempSrc: Oral         Complications: No apparent anesthesia complications

## 2017-05-08 NOTE — Anesthesia Post-op Follow-up Note (Signed)
Anesthesia QCDR form completed.        

## 2017-05-08 NOTE — Anesthesia Procedure Notes (Signed)
Procedure Name: Intubation Date/Time: 05/08/2017 12:55 PM Performed by: Ginger Carne Pre-anesthesia Checklist: Patient identified, Emergency Drugs available, Suction available, Patient being monitored and Timeout performed Patient Re-evaluated:Patient Re-evaluated prior to induction Oxygen Delivery Method: Circle system utilized Preoxygenation: Pre-oxygenation with 100% oxygen Induction Type: IV induction Ventilation: Mask ventilation without difficulty Laryngoscope Size: Miller and 2 Grade View: Grade I Tube type: Oral Tube size: 7.0 mm Number of attempts: 1 Airway Equipment and Method: Stylet Placement Confirmation: ETT inserted through vocal cords under direct vision,  positive ETCO2 and breath sounds checked- equal and bilateral Secured at: 21 cm Tube secured with: Tape Dental Injury: Teeth and Oropharynx as per pre-operative assessment

## 2017-05-09 ENCOUNTER — Encounter: Payer: Self-pay | Admitting: Obstetrics and Gynecology

## 2017-05-09 NOTE — Anesthesia Postprocedure Evaluation (Signed)
Anesthesia Post Note  Patient: Mia Thompson  Procedure(s) Performed: LAPAROSCOPY OPERATIVE (N/A ) INTRAUTERINE DEVICE (IUD) REMOVAL (N/A ) LAPAROSCOPIC LYSIS OF ADHESIONS  Patient location during evaluation: PACU Anesthesia Type: General Level of consciousness: awake and alert Pain management: pain level controlled Vital Signs Assessment: post-procedure vital signs reviewed and stable Respiratory status: spontaneous breathing, nonlabored ventilation, respiratory function stable and patient connected to nasal cannula oxygen Cardiovascular status: blood pressure returned to baseline and stable Postop Assessment: no apparent nausea or vomiting Anesthetic complications: no     Last Vitals:  Vitals:   05/08/17 1501 05/08/17 1539  BP: 100/73 106/63  Pulse: 71 73  Resp: 16   Temp: 36.7 C   SpO2: 99% 100%    Last Pain:  Vitals:   05/08/17 1539  TempSrc:   PainSc: 3                  Yevette Edwards

## 2017-06-01 ENCOUNTER — Encounter: Payer: BLUE CROSS/BLUE SHIELD | Admitting: Family Medicine

## 2017-06-01 ENCOUNTER — Ambulatory Visit (INDEPENDENT_AMBULATORY_CARE_PROVIDER_SITE_OTHER): Payer: BLUE CROSS/BLUE SHIELD | Admitting: Family Medicine

## 2017-06-01 ENCOUNTER — Encounter: Payer: Self-pay | Admitting: Family Medicine

## 2017-06-01 VITALS — BP 96/63 | HR 80 | Temp 97.6°F | Ht 63.0 in | Wt 168.9 lb

## 2017-06-01 DIAGNOSIS — Z0000 Encounter for general adult medical examination without abnormal findings: Secondary | ICD-10-CM

## 2017-06-01 LAB — UA/M W/RFLX CULTURE, ROUTINE
Bilirubin, UA: NEGATIVE
GLUCOSE, UA: NEGATIVE
Leukocytes, UA: NEGATIVE
NITRITE UA: NEGATIVE
SPEC GRAV UA: 1.025 (ref 1.005–1.030)
Urobilinogen, Ur: 4 mg/dL — ABNORMAL HIGH (ref 0.2–1.0)
pH, UA: 6.5 (ref 5.0–7.5)

## 2017-06-01 LAB — MICROSCOPIC EXAMINATION

## 2017-06-01 NOTE — Progress Notes (Signed)
BP 96/63 (BP Location: Right Arm, Patient Position: Sitting, Cuff Size: Normal)   Pulse 80   Temp 97.6 F (36.4 C)   Ht 5\' 3"  (1.6 m)   Wt 168 lb 14.4 oz (76.6 kg)   LMP  (LMP Unknown)   SpO2 100%   BMI 29.92 kg/m    Subjective:    Patient ID: Mia Thompson, female    DOB: [birth year], 41 y.o.   MRN: [id number]  HPI: Aitanna Haubner is a 41 y.o. female presenting on 06/01/2017 for comprehensive medical examination. Current medical complaints include:none  Started taking B12 injections with GYN  Has mammogram ordered - will call and schedule  Seeing Neurologist for migraine medications.   Menopausal Symptoms: no  Depression Screen done today and results listed below:  Depression screen [facility] 2/9 06/01/2017  Decreased Interest 1  Down, Depressed, Hopeless 1  PHQ - 2 Score 2  Altered sleeping 0  Tired, decreased energy 3  Change in appetite 3  Feeling bad or failure about yourself  1  Trouble concentrating 1  Moving slowly or fidgety/restless 0  Suicidal thoughts 0  PHQ-9 Score 10  Difficult doing work/chores Somewhat difficult    The patient does not have a history of falls. I did not complete a risk assessment for falls. A plan of care for falls was not documented.   Past Medical History:  Past Medical History:  Diagnosis Date  . Anxiety   . Depression   . GERD (gastroesophageal reflux disease)   . Migraines   . Migraines    sees Neuro  . Prolapse of female bladder, acquired     Surgical History:  Past Surgical History:  Procedure Laterality Date  . IUD REMOVAL N/A 05/08/2017   Procedure: INTRAUTERINE DEVICE (IUD) REMOVAL;  Surgeon: Hildred Laser, MD;  Location: [facility] ORS;  Service: Gynecology;  Laterality: N/A;  . LAPAROSCOPIC LYSIS OF ADHESIONS  05/08/2017   Procedure: LAPAROSCOPIC LYSIS OF ADHESIONS;  Surgeon: Hildred Laser, MD;  Location: [facility] ORS;  Service: Gynecology;;  . LAPAROSCOPY N/A 05/08/2017   Procedure: LAPAROSCOPY OPERATIVE;  Surgeon: Hildred Laser,  MD;  Location: [facility] ORS;  Service: Gynecology;  Laterality: N/A;  . THUMB SURGERY Left AGE 27  . WISDOM TOOTH EXTRACTION      Medications:  Current Outpatient Prescriptions on File Prior to Visit  Medication Sig  . baclofen (LIORESAL) 10 MG tablet Take 1 tablet (10 mg total) by mouth 2 (two) times daily as needed. (Patient taking differently: Take 10 mg by mouth 2 (two) times daily as needed for muscle spasms. )  . cetirizine (ZYRTEC) 10 MG tablet Take 10 mg by mouth at bedtime.  . cyanocobalamin (,VITAMIN B-12,) 1000 MCG/ML injection Inject 1 mL (1,000 mcg total) into the muscle every 30 (thirty) days.  . DULoxetine (CYMBALTA) 60 MG capsule Take 60 mg by mouth at bedtime.   . fluticasone (FLONASE) 50 MCG/ACT nasal spray Place 2 sprays into both nostrils daily.  . medroxyPROGESTERone (DEPO-PROVERA) 150 MG/ML injection Inject 1 mL (150 mg total) into the muscle every 3 (three) months.  . montelukast (SINGULAIR) 10 MG tablet Take 1 tablet (10 mg total) by mouth at bedtime.  . Multiple Vitamin (MULTI-VITAMINS) TABS Take 1 tablet by mouth daily.   . naproxen (NAPROSYN) 500 MG tablet Take 500 mg by mouth 2 (two) times daily as needed for headache.   . promethazine (PHENERGAN) 25 MG tablet Take 1 tablet (25 mg total) by mouth every 8 (eight) hours as needed for  nausea or vomiting.  . rizatriptan (MAXALT) 10 MG tablet Take 1 tablet (10 mg total) by mouth as needed for migraine. May repeat in 2 hours if needed  . topiramate (TOPAMAX) 100 MG tablet Take 100 mg by mouth at bedtime.    No current facility-administered medications on file prior to visit.     Allergies:  Allergies  Allergen Reactions  . Vantin [Cefpodoxime] Rash    Shortness of breath    Social History:  Social History   Social History  . Marital status: Married    Spouse name: N/A  . Number of children: N/A  . Years of education: N/A   Occupational History  . Not on file.   Social History Main Topics  . Smoking  status: Former Games developermoker  . Smokeless tobacco: Never Used  . Alcohol use Yes     Comment: occassional  . Drug use: No  . Sexual activity: Yes    Birth control/ protection: IUD     Comment: mirena   Other Topics Concern  . Not on file   Social History Narrative  . No narrative on file   History  Smoking Status  . Former Smoker  Smokeless Tobacco  . Never Used   History  Alcohol Use  . Yes    Comment: occassional    Family History:  Family History  Problem Relation Age of Onset  . Adopted: Yes    Past medical history, surgical history, medications, allergies, family history and social history reviewed with patient today and changes made to appropriate areas of the chart.   Review of Systems - General ROS: negative Psychological ROS: negative Ophthalmic ROS: negative ENT ROS: negative Endocrine ROS: negative Breast ROS: negative for breast lumps Respiratory ROS: no cough, shortness of breath, or wheezing Cardiovascular ROS: no chest pain or dyspnea on exertion Gastrointestinal ROS: no abdominal pain, change in bowel habits, or black or bloody stools Genito-Urinary ROS: no dysuria, trouble voiding, or hematuria All other ROS negative except what is listed above and in the HPI.      Objective:    BP 96/63 (BP Location: Right Arm, Patient Position: Sitting, Cuff Size: Normal)   Pulse 80   Temp 97.6 F (36.4 C)   Ht 5\' 3"  (1.6 m)   Wt 168 lb 14.4 oz (76.6 kg)   LMP  (LMP Unknown)   SpO2 100%   BMI 29.92 kg/m   Wt Readings from Last 3 Encounters:  06/01/17 168 lb 14.4 oz (76.6 kg)  05/08/17 164 lb (74.4 kg)  05/05/17 164 lb (74.4 kg)    Physical Exam  Constitutional: She is oriented to person, place, and time. She appears well-developed and well-nourished. No distress.  HENT:  Head: Atraumatic.  Right Ear: External ear normal.  Left Ear: External ear normal.  Nose: Nose normal.  Mouth/Throat: Oropharynx is clear and moist. No oropharyngeal exudate.  Eyes:  Pupils are equal, round, and reactive to light. Conjunctivae are normal. No scleral icterus.  Neck: Normal range of motion. Neck supple. No thyromegaly present.  Cardiovascular: Normal rate, regular rhythm, normal heart sounds and intact distal pulses.   Pulmonary/Chest: Effort normal and breath sounds normal. No respiratory distress.  Abdominal: Soft. Bowel sounds are normal. She exhibits no mass. There is no tenderness.  Musculoskeletal: Normal range of motion. She exhibits no edema or tenderness.  Lymphadenopathy:    She has no cervical adenopathy.  Neurological: She is alert and oriented to person, place, and time. No cranial nerve  deficit.  Skin: Skin is warm and dry. No rash noted.  Psychiatric: She has a normal mood and affect. Her behavior is normal.  Nursing note and vitals reviewed.   Results for orders placed or performed in visit on 06/01/17  Microscopic Examination  Result Value Ref Range   WBC, UA 0-5 0 - 5 /hpf   RBC, UA 0-2 0 - 2 /hpf   Epithelial Cells (non renal) 0-10 0 - 10 /hpf   Bacteria, UA Few None seen/Few   Yeast, UA Present (A) None seen  HM HIV SCREENING LAB  Result Value Ref Range   HM HIV Screening Negative - Patient reported   UA/M w/rflx Culture, Routine  Result Value Ref Range   Specific Gravity, UA 1.025 1.005 - 1.030   pH, UA 6.5 5.0 - 7.5   Color, UA Orange Yellow   Appearance Ur Hazy (A) Clear   Leukocytes, UA Negative Negative   Protein, UA Trace (A) Negative/Trace   Glucose, UA Negative Negative   Ketones, UA Trace (A) Negative   RBC, UA 2+ (A) Negative   Bilirubin, UA Negative Negative   Urobilinogen, Ur 4.0 (H) 0.2 - 1.0 mg/dL   Nitrite, UA Negative Negative   Microscopic Examination See below:       Assessment & Plan:   Problem List Items Addressed This Visit    None    Visit Diagnoses    Annual physical exam    -  Primary   Labs recently done through GYN, all WNL minus B12 def. currently on injection. Did not have U/A, will  perform today.    Relevant Orders   UA/M w/rflx Culture, Routine (Completed)       Follow up plan: Return in about 1 year (around 06/01/2018) for CPE.   LABORATORY TESTING:  - Pap smear: up to date  IMMUNIZATIONS:   - Tdap: Tetanus vaccination status reviewed: last tetanus booster within 10 years. - Influenza: will get soon through work  SCREENING: -Mammogram: Ordered by GYN recently, will call and schedule   PATIENT COUNSELING:   Advised to take 1 mg of folate supplement per day if capable of pregnancy.   Sexuality: Discussed sexually transmitted diseases, partner selection, use of condoms, avoidance of unintended pregnancy  and contraceptive alternatives.   Advised to avoid cigarette smoking.  I discussed with the patient that most people either abstain from alcohol or drink within safe limits (<=14/week and <=4 drinks/occasion for males, <=7/weeks and <= 3 drinks/occasion for females) and that the risk for alcohol disorders and other health effects rises proportionally with the number of drinks per week and how often a drinker exceeds daily limits.  Discussed cessation/primary prevention of drug use and availability of treatment for abuse.   Diet: Encouraged to adjust caloric intake to maintain  or achieve ideal body weight, to reduce intake of dietary saturated fat and total fat, to limit sodium intake by avoiding high sodium foods and not adding table salt, and to maintain adequate dietary potassium and calcium preferably from fresh fruits, vegetables, and low-fat dairy products.    stressed the importance of regular exercise  Injury prevention: Discussed safety belts, safety helmets, smoke detector, smoking near bedding or upholstery.   Dental health: Discussed importance of regular tooth brushing, flossing, and dental visits.    NEXT PREVENTATIVE PHYSICAL DUE IN 1 YEAR. Return in about 1 year (around 06/01/2018) for CPE.

## 2017-06-04 NOTE — Patient Instructions (Signed)
Follow up as needed

## 2017-06-05 ENCOUNTER — Other Ambulatory Visit: Payer: Self-pay | Admitting: Family Medicine

## 2017-06-05 NOTE — Telephone Encounter (Signed)
Pharmacy refill request for Singulair; Pt's last MD visit was 06/01/17; No request for since for med since November, 2017. Please clarify if MD wants to continue or discontinue singulair.

## 2017-06-07 ENCOUNTER — Ambulatory Visit (INDEPENDENT_AMBULATORY_CARE_PROVIDER_SITE_OTHER): Payer: BLUE CROSS/BLUE SHIELD | Admitting: Obstetrics and Gynecology

## 2017-06-07 ENCOUNTER — Encounter: Payer: Self-pay | Admitting: Obstetrics and Gynecology

## 2017-06-07 VITALS — BP 103/49 | HR 99 | Ht 62.0 in | Wt 168.0 lb

## 2017-06-07 DIAGNOSIS — E669 Obesity, unspecified: Secondary | ICD-10-CM

## 2017-06-07 MED ORDER — PHENTERMINE HCL 37.5 MG PO TABS: 37.5000 mg | ORAL_TABLET | Freq: Every day | ORAL | 2 refills | Status: DC

## 2017-06-07 NOTE — Progress Notes (Signed)
Subjective:  Mia Thompson is a 41 y.o. G3P0 at Unknown being seen today for weight loss management- initial visit.  Patient reports General ROS: negative and reports previous weight loss attempts:  Onset was gradual over last 2 year(s) .   Previous/Current treatment includes: small frequent feedings, nutritional supplement,  And regular exercise.   Pertinent medical history includes: chronic digestive disease, diabetes, eating disorder, anxiety and psychiatric illness.    The following portions of the patient's history were reviewed and updated as appropriate: allergies, current medications, past family history, past medical history, past social history, past surgical history and problem list.   Objective:   Vitals:   06/07/17 1036  BP: (!) 103/49  Pulse: 99  Weight: 168 lb (76.2 kg)  Height: 5\' 2"  (1.575 m)    General:  Alert, oriented and cooperative. Patient is in no acute distress.  :   :   :   :   :   :   PE: Well groomed female in no current distress,   Mental Status: Normal mood and affect. Normal behavior. Normal judgment and thought content.   Current BMI: Body mass index is 30.73 kg/m.   Assessment and Plan:  Obesity  There are no diagnoses linked to this encounter.  Plan: low carb, High protein diet RX for adipex 37.5 mg daily and B12 1000mcg.ml monthly, to start now with first injection given at today's visit. Reviewed side-effects common to both medications and expected outcomes. Increase daily water intake to at least 8 bottle a day, every day.  Goal is to reduse weight by 10% by end of three months, and will re-evaluate then.  RTC in 4 weeks for Nurse visit to check weight & BP, and get next B12 injections.    Please refer to After Visit Summary for other counseling recommendations.    WellingShambley, Mia Thompson, CNM   Mia Thompson, CNM      Consider the Low Glycemic Index Diet and 6 smaller meals daily .  This boosts your metabolism and  regulates your sugars:   Use the protein bar by [employer]kins because they have lots of fiber in them  Find the low carb flatbreads, tortillas and pita breads for sandwiches:  Joseph's makes a pita bread and a flat bread , available at [facility] and [employer]; Toufayah makes a low carb flatbread available at [employer] and [employer] that is 9 net carbs and 100 cal Mission makes a low carb whole wheat tortilla available at [employer],and most grocery stores with 6 net carbs and 210 cal  [place] yogurt can still have a lot of carbs .  Dannon Light Thompson fit has 80 cal and 8 carbs

## 2017-06-07 NOTE — Patient Instructions (Signed)

## 2017-07-07 ENCOUNTER — Encounter: Payer: Self-pay | Admitting: Obstetrics and Gynecology

## 2017-07-07 ENCOUNTER — Ambulatory Visit: Payer: BLUE CROSS/BLUE SHIELD | Admitting: Obstetrics and Gynecology

## 2017-07-07 VITALS — BP 100/68 | HR 128 | Wt 168.0 lb

## 2017-07-07 DIAGNOSIS — E663 Overweight: Secondary | ICD-10-CM

## 2017-07-07 NOTE — Progress Notes (Signed)
Pt is here for wt, bp check, b-12 inj She is doing well ,denies any s/e  07/07/17 wt- 168lb 06/07/17 wt- 163.8lb

## 2017-07-18 ENCOUNTER — Telehealth: Payer: Self-pay | Admitting: Family Medicine

## 2017-07-18 NOTE — Telephone Encounter (Signed)
Called and spoke to patient. Message is from a month ago. Spoke with office liaison, Ronney Astersiffany Cheek, Sales executiveand manager. Message was just received by office today. Pt denied any further questions.

## 2017-07-18 NOTE — Telephone Encounter (Signed)
Copied from CRM [id number]. Topic: Inquiry >> Jun 02, 2017  1:04 PM Mia Thompson, [place], [place] wrote: Reason for AOZ:[id number]:Patient has some questions about test results that are on her My Chart account. Patient would like for someone to call her back @ [phone]

## 2017-07-31 ENCOUNTER — Encounter: Payer: Self-pay | Admitting: *Deleted

## 2017-08-14 ENCOUNTER — Other Ambulatory Visit: Payer: Self-pay | Admitting: Family Medicine

## 2017-09-21 ENCOUNTER — Encounter: Payer: Self-pay | Admitting: Obstetrics and Gynecology

## 2017-11-03 DIAGNOSIS — S90821A Blister (nonthermal), right foot, initial encounter: Secondary | ICD-10-CM | POA: Diagnosis not present

## 2017-11-03 DIAGNOSIS — M722 Plantar fascial fibromatosis: Secondary | ICD-10-CM | POA: Diagnosis not present

## 2017-11-03 DIAGNOSIS — Z3200 Encounter for pregnancy test, result unknown: Secondary | ICD-10-CM | POA: Diagnosis not present

## 2017-11-27 DIAGNOSIS — G43009 Migraine without aura, not intractable, without status migrainosus: Secondary | ICD-10-CM | POA: Diagnosis not present

## 2018-03-30 DIAGNOSIS — G4710 Hypersomnia, unspecified: Secondary | ICD-10-CM | POA: Diagnosis not present

## 2018-03-30 DIAGNOSIS — G43709 Chronic migraine without aura, not intractable, without status migrainosus: Secondary | ICD-10-CM | POA: Diagnosis not present

## 2018-04-11 ENCOUNTER — Ambulatory Visit (INDEPENDENT_AMBULATORY_CARE_PROVIDER_SITE_OTHER): Payer: BLUE CROSS/BLUE SHIELD | Admitting: Obstetrics and Gynecology

## 2018-04-11 ENCOUNTER — Encounter: Payer: Self-pay | Admitting: Obstetrics and Gynecology

## 2018-04-11 VITALS — BP 112/78 | HR 117 | Ht 62.0 in | Wt 183.0 lb

## 2018-04-11 DIAGNOSIS — E538 Deficiency of other specified B group vitamins: Secondary | ICD-10-CM

## 2018-04-11 DIAGNOSIS — Z01419 Encounter for gynecological examination (general) (routine) without abnormal findings: Secondary | ICD-10-CM | POA: Diagnosis not present

## 2018-04-11 DIAGNOSIS — R5383 Other fatigue: Secondary | ICD-10-CM | POA: Diagnosis not present

## 2018-04-11 DIAGNOSIS — Z13220 Encounter for screening for lipoid disorders: Secondary | ICD-10-CM | POA: Diagnosis not present

## 2018-04-11 MED ORDER — PHENDIMETRAZINE TARTRATE ER 105 MG PO CP24: 1.0000 | ORAL_CAPSULE | Freq: Every day | ORAL | 2 refills | Status: DC

## 2018-04-11 MED ORDER — CYANOCOBALAMIN 1000 MCG/ML IJ SOLN
1000.0000 ug | INTRAMUSCULAR | 1 refills | Status: DC
Start: 2018-04-11 — End: 2020-11-18

## 2018-04-11 NOTE — Progress Notes (Signed)
Subjective:   Mia Thompson is a 42 y.o. G45P0 Caucasian female here for a routine well-woman exam.  No LMP recorded. (Menstrual status: Other).    Current complaints: weight gain, lack of energy[place] 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 Tobacco/alcohol: no tobacco use Illicit drugs: no history of illicit drug use  The following portions of the patient's history were reviewed and updated as appropriate: allergies, current medications, past family history, past medical history, past social history, past surgical history and problem list.  Past Medical History Past Medical History:  Diagnosis Date  . Anxiety   . Depression   . GERD (gastroesophageal reflux disease)   . Migraines   . Migraines    sees Neuro  . Prolapse of female bladder, acquired     Past Surgical History Past Surgical History:  Procedure Laterality Date  . IUD REMOVAL N/A 05/08/2017   Procedure: INTRAUTERINE DEVICE (IUD) REMOVAL;  Surgeon: Hildred Laser, MD;  Location: [facility];  Service: Gynecology;  Laterality: N/A;  . LAPAROSCOPIC LYSIS OF ADHESIONS  05/08/2017   Procedure: LAPAROSCOPIC LYSIS OF ADHESIONS;  Surgeon: Hildred Laser, MD;  Location: [facility];  Service: Gynecology;;  . LAPAROSCOPY N/A 05/08/2017   Procedure: LAPAROSCOPY OPERATIVE;  Surgeon: Hildred Laser, MD;  Location: [facility];  Service: Gynecology;  Laterality: N/A;  . THUMB SURGERY Left AGE 61  . WISDOM TOOTH EXTRACTION      Gynecologic History G3P0  No LMP recorded. (Menstrual status: Other). Contraception: OCPs-progestin only Last Pap: 2017. Results were: normal Last mammogram: ?. Results were: normal   Obstetric History OB History  Gravida Para Term Preterm AB Living  3         3  SAB TAB Ectopic Multiple Live Births          3    # Outcome Date GA Lbr Len/2nd Weight  Sex Delivery Anes PTL Lv  3 Gravida 2011    M Vag-Spont   LIV  2 Gravida 2005    F Vag-Spont   LIV  1 Gravida 2002    F Vag-Spont   LIV    Current Medications Current Outpatient Medications on File Prior to Visit  Medication Sig Dispense Refill  . baclofen (LIORESAL) 10 MG tablet Take 1 tablet (10 mg total) by mouth 2 (two) times daily as needed. (Patient taking differently: Take 10 mg by mouth 2 (two) times daily as needed for muscle spasms. ) 60 each 3  . cetirizine (ZYRTEC) 10 MG tablet Take 10 mg by mouth at bedtime.    . cyanocobalamin (,VITAMIN B-12,) 1000 MCG/ML injection Inject 1 mL (1,000 mcg total) into the muscle every 30 (thirty) days. 10 mL 1  . DULoxetine (CYMBALTA) 60 MG capsule Take 60 mg by mouth at bedtime.   2  . fluticasone (FLONASE) 50 MCG/ACT nasal spray Place 2 sprays into both nostrils daily. 16 g 3  . montelukast (SINGULAIR) 10 MG tablet Take 1 tablet (10 mg total) by mouth at bedtime. 90 tablet 3  . Multiple Vitamin (MULTI-VITAMINS) TABS Take 1 tablet by mouth daily.     . naproxen (NAPROSYN) 500 MG tablet Take 500 mg by mouth 2 (two) times daily as needed for headache.   0  . promethazine (PHENERGAN) 25 MG tablet Take 1 tablet (25 mg total) by mouth every 8 (eight)  hours as needed for nausea or vomiting. 20 tablet 0  . rizatriptan (MAXALT) 10 MG tablet Take 1 tablet (10 mg total) by mouth as needed for migraine. May repeat in 2 hours if needed 10 tablet 0  . topiramate (TOPAMAX) 100 MG tablet Take 100 mg by mouth at bedtime.     . medroxyPROGESTERone (DEPO-PROVERA) 150 MG/ML injection Inject 1 mL (150 mg total) into the muscle every 3 (three) months. (Patient not taking: Reported on 04/11/2018) 1 mL 4  . phentermine (ADIPEX-P) 37.5 MG tablet Take 1 tablet (37.5 mg total) by mouth daily before breakfast. (Patient not taking: Reported on 04/11/2018) 30 tablet 2   No current facility-administered medications on file prior to visit.     Review of Systems Patient denies  any headaches, blurred vision, shortness of breath, chest pain, abdominal pain, problems with bowel movements, urination, or intercourse.  Objective:  BP 112/78   Pulse (!) 117   Ht 5\' 2"  (1.575 m)   Wt 183 lb (83 kg)   BMI 33.47 kg/m  Physical Exam  General:  Well developed, well nourished, no acute distress. She is alert and oriented x3. Skin:  Warm and dry Neck:  Midline trachea, no thyromegaly or nodules Cardiovascular: Regular rate and rhythm, no murmur heard Lungs:  Effort normal, all lung fields clear to auscultation bilaterally Breasts:  No dominant palpable mass, retraction, or nipple discharge Abdomen:  Soft, non tender, no hepatosplenomegaly or masses Pelvic:  External genitalia is normal in appearance.  The vagina is normal in appearance. The cervix is bulbous, no CMT.  Thin prep pap is not done. Uterus is felt to be normal size, shape, and contour.  No adnexal masses or tenderness noted. Extremities:  No swelling or varicosities noted Psych:  She has a normal mood and affect  Assessment:   Healthy well-woman exam Obesity B12 deficiency   Plan:  Discussed weight loss-encouraged to resume exercise.willing to try ER phentermine and discussed exercise.  F/U 1 year for AE, or sooner if needed Mammogram ordered  Tamkia Temples Suzan Nailer, CNM

## 2018-04-11 NOTE — Patient Instructions (Signed)
Preventive Care 18-39 Years, Female Preventive care refers to lifestyle choices and visits with your health care provider that can promote health and wellness. What does preventive care include?  A yearly physical exam. This is also called an annual well check.  Dental exams once or twice a year.  Routine eye exams. Ask your health care provider how often you should have your eyes checked.  Personal lifestyle choices, including: ? Daily care of your teeth and gums. ? Regular physical activity. ? Eating a healthy diet. ? Avoiding tobacco and drug use. ? Limiting alcohol use. ? Practicing safe sex. ? Taking vitamin and mineral supplements as recommended by your health care provider. What happens during an annual well check? The services and screenings done by your health care provider during your annual well check will depend on your age, overall health, lifestyle risk factors, and family history of disease. Counseling Your health care provider may ask you questions about your:  Alcohol use.  Tobacco use.  Drug use.  Emotional well-being.  Home and relationship well-being.  Sexual activity.  Eating habits.  Work and work Statistician.  Method of birth control.  Menstrual cycle.  Pregnancy history.  Screening You may have the following tests or measurements:  Height, weight, and BMI.  Diabetes screening. This is done by checking your blood sugar (glucose) after you have not eaten for a while (fasting).  Blood pressure.  Lipid and cholesterol levels. These may be checked every 5 years starting at age 38.  Skin check.  Hepatitis C blood test.  Hepatitis B blood test.  Sexually transmitted disease (STD) testing.  BRCA-related cancer screening. This may be done if you have a family history of breast, ovarian, tubal, or peritoneal cancers.  Pelvic exam and Pap test. This may be done every 3 years starting at age 38. Starting at age 30, this may be done  every 5 years if you have a Pap test in combination with an HPV test.  Discuss your test results, treatment options, and if necessary, the need for more tests with your health care provider. Vaccines Your health care provider may recommend certain vaccines, such as:  Influenza vaccine. This is recommended every year.  Tetanus, diphtheria, and acellular pertussis (Tdap, Td) vaccine. You may need a Td booster every 10 years.  Varicella vaccine. You may need this if you have not been vaccinated.  HPV vaccine. If you are 39 or younger, you may need three doses over 6 months.  Measles, mumps, and rubella (MMR) vaccine. You may need at least one dose of MMR. You may also need a second dose.  Pneumococcal 13-valent conjugate (PCV13) vaccine. You may need this if you have certain conditions and were not previously vaccinated.  Pneumococcal polysaccharide (PPSV23) vaccine. You may need one or two doses if you smoke cigarettes or if you have certain conditions.  Meningococcal vaccine. One dose is recommended if you are age 68-21 years and a first-year college student living in a residence hall, or if you have one of several medical conditions. You may also need additional booster doses.  Hepatitis A vaccine. You may need this if you have certain conditions or if you travel or work in places where you may be exposed to hepatitis A.  Hepatitis B vaccine. You may need this if you have certain conditions or if you travel or work in places where you may be exposed to hepatitis B.  Haemophilus influenzae type b (Hib) vaccine. You may need this  if you have certain risk factors.  Talk to your health care provider about which screenings and vaccines you need and how often you need them. This information is not intended to replace advice given to you by your health care provider. Make sure you discuss any questions you have with your health care provider. Document Released: 09/20/2001 Document Revised:  04/13/2016 Document Reviewed: 05/26/2015 Elsevier Interactive Patient Education  2018 Elsevier Inc.  

## 2018-04-12 ENCOUNTER — Other Ambulatory Visit: Payer: Self-pay | Admitting: Obstetrics and Gynecology

## 2018-04-12 DIAGNOSIS — Z1231 Encounter for screening mammogram for malignant neoplasm of breast: Secondary | ICD-10-CM

## 2018-04-12 LAB — COMPREHENSIVE METABOLIC PANEL
ALT: 7 IU/L (ref 0–32)
AST: 12 IU/L (ref 0–40)
Albumin/Globulin Ratio: 1.9 (ref 1.2–2.2)
Albumin: 3.9 g/dL (ref 3.5–5.5)
Alkaline Phosphatase: 48 IU/L (ref 39–117)
BUN/Creatinine Ratio: 18 (ref 9–23)
BUN: 16 mg/dL (ref 6–24)
Bilirubin Total: 0.2 mg/dL (ref 0.0–1.2)
CALCIUM: 9 mg/dL (ref 8.7–10.2)
CO2: 19 mmol/L — ABNORMAL LOW (ref 20–29)
CREATININE: 0.89 mg/dL (ref 0.57–1.00)
Chloride: 106 mmol/L (ref 96–106)
GFR calc Af Amer: 93 mL/min/{1.73_m2} (ref >59)
GFR, EST NON AFRICAN AMERICAN: 81 mL/min/{1.73_m2} (ref >59)
Globulin, Total: 2.1 g/dL (ref 1.5–4.5)
Glucose: 65 mg/dL (ref 65–99)
Potassium: 4.1 mmol/L (ref 3.5–5.2)
Sodium: 139 mmol/L (ref 134–144)
Total Protein: 6 g/dL (ref 6.0–8.5)

## 2018-04-12 LAB — LIPID PANEL
CHOL/HDL RATIO: 3 ratio (ref 0.0–4.4)
CHOLESTEROL TOTAL: 136 mg/dL (ref 100–199)
HDL: 46 mg/dL (ref >39)
LDL CALC: 77 mg/dL (ref 0–99)
Triglycerides: 64 mg/dL (ref 0–149)
VLDL Cholesterol Cal: 13 mg/dL (ref 5–40)

## 2018-04-12 LAB — CBC
HEMATOCRIT: 41.1 % (ref 34.0–46.6)
HEMOGLOBIN: 13.7 g/dL (ref 11.1–15.9)
MCH: 30.2 pg (ref 26.6–33.0)
MCHC: 33.3 g/dL (ref 31.5–35.7)
MCV: 91 fL (ref 79–97)
Platelets: 287 10*3/uL (ref 150–450)
RBC: 4.53 x10E6/uL (ref 3.77–5.28)
RDW: 13.2 % (ref 12.3–15.4)
WBC: 7.9 10*3/uL (ref 3.4–10.8)

## 2018-04-12 LAB — B12 AND FOLATE PANEL
Folate: 6.6 ng/mL (ref >3.0)
VITAMIN B 12: 257 pg/mL (ref 232–1245)

## 2018-04-12 LAB — THYROID PANEL WITH TSH
Free Thyroxine Index: 1.9 (ref 1.2–4.9)
T3 UPTAKE RATIO: 23 % — AB (ref 24–39)
T4, Total: 8.2 ug/dL (ref 4.5–12.0)
TSH: 1.5 u[IU]/mL (ref 0.450–4.500)

## 2018-04-12 LAB — HEMOGLOBIN A1C
ESTIMATED AVERAGE GLUCOSE: 100 mg/dL
Hgb A1c MFr Bld: 5.1 % (ref 4.8–5.6)

## 2018-05-01 ENCOUNTER — Other Ambulatory Visit: Payer: Self-pay | Admitting: Obstetrics and Gynecology

## 2018-05-01 ENCOUNTER — Ambulatory Visit
Admission: RE | Admit: 2018-05-01 | Discharge: 2018-05-01 | Disposition: A | Discharge status: Home / Self Care | Payer: BLUE CROSS/BLUE SHIELD | Source: Ambulatory Visit | Attending: Obstetrics and Gynecology | Admitting: Obstetrics and Gynecology

## 2018-05-01 DIAGNOSIS — N6320 Unspecified lump in the left breast, unspecified quadrant: Secondary | ICD-10-CM

## 2018-05-01 DIAGNOSIS — R928 Other abnormal and inconclusive findings on diagnostic imaging of breast: Secondary | ICD-10-CM

## 2018-05-01 DIAGNOSIS — Z1231 Encounter for screening mammogram for malignant neoplasm of breast: Secondary | ICD-10-CM | POA: Insufficient documentation

## 2018-05-01 IMAGING — MG MM DIGITAL SCREENING BILAT W/ TOMO W/ CAD
4 series · 4 of 4 positions shown · non-contrast
Comparison: None.

CLINICAL DATA: Screening.

EXAM:
DIGITAL SCREENING BILATERAL MAMMOGRAM WITH TOMO AND CAD

[L MLO (1 of 2)]
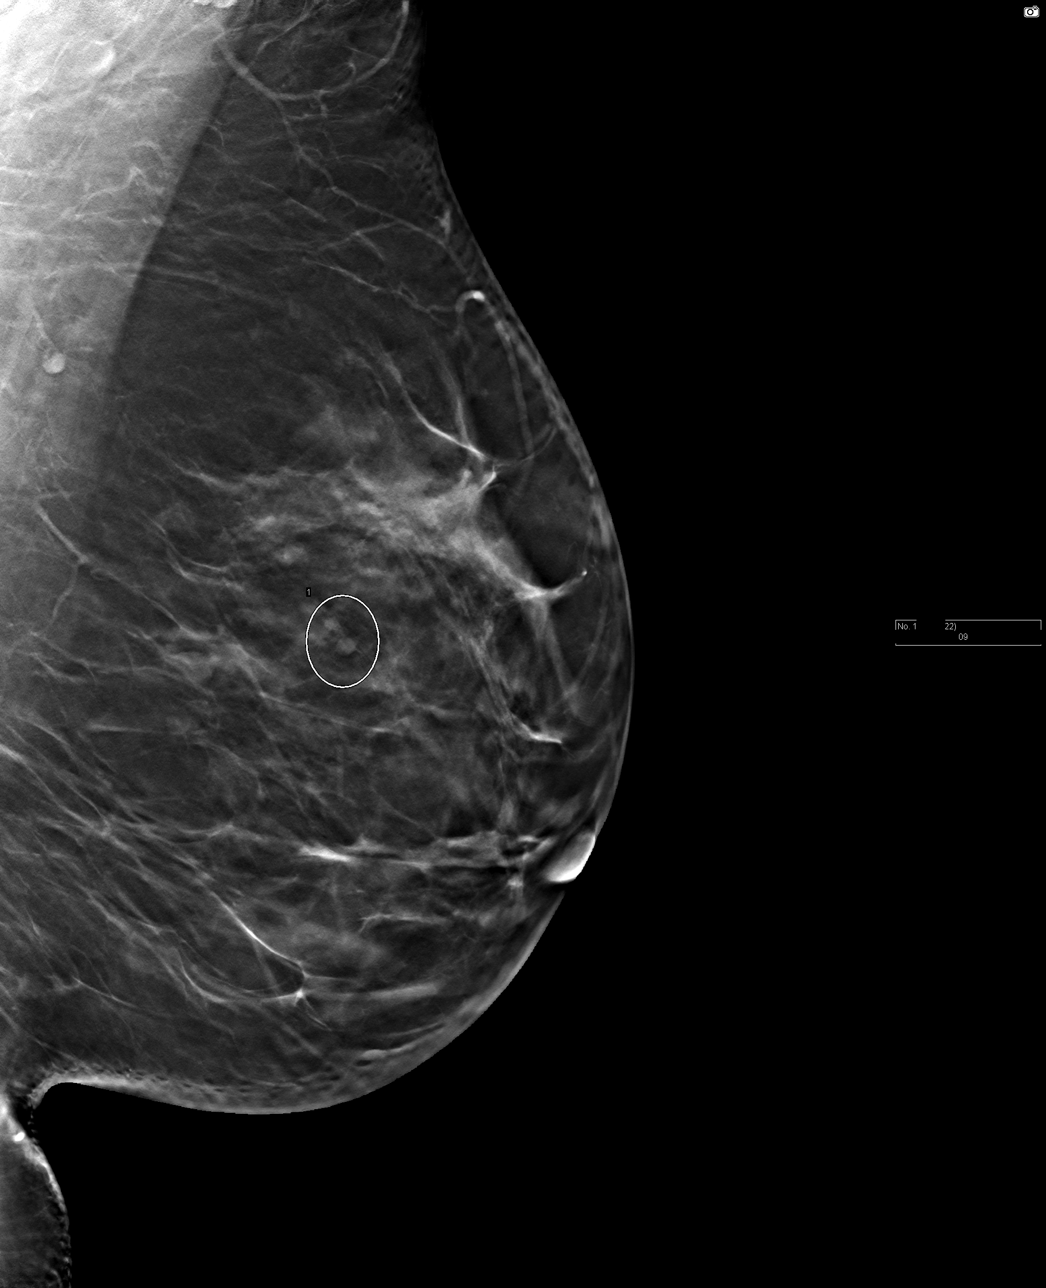

[L CC (1 of 2)]
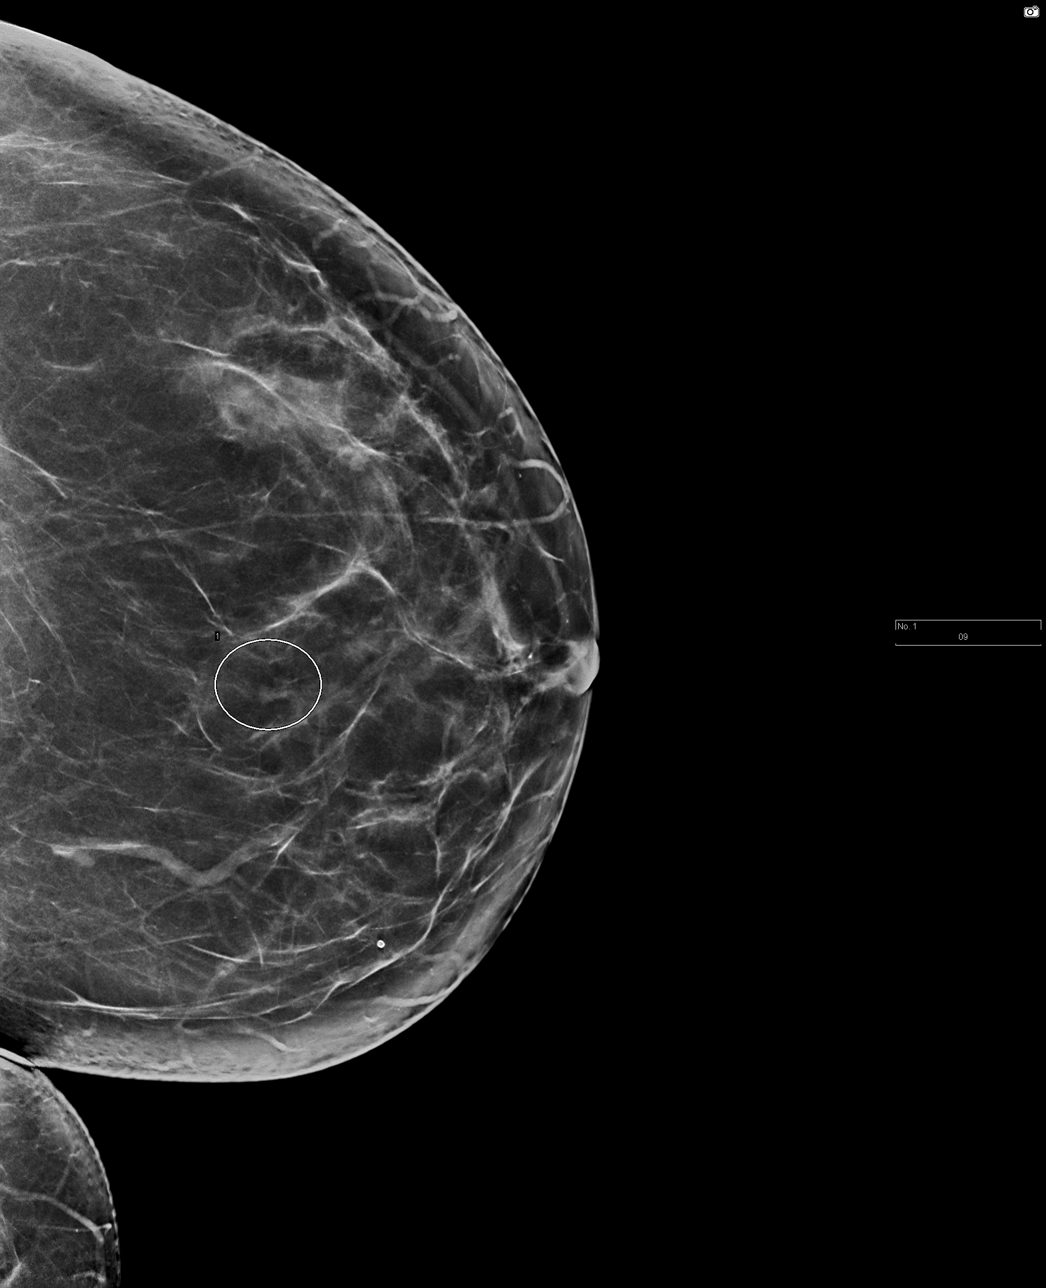

[L CC (2 of 2)]
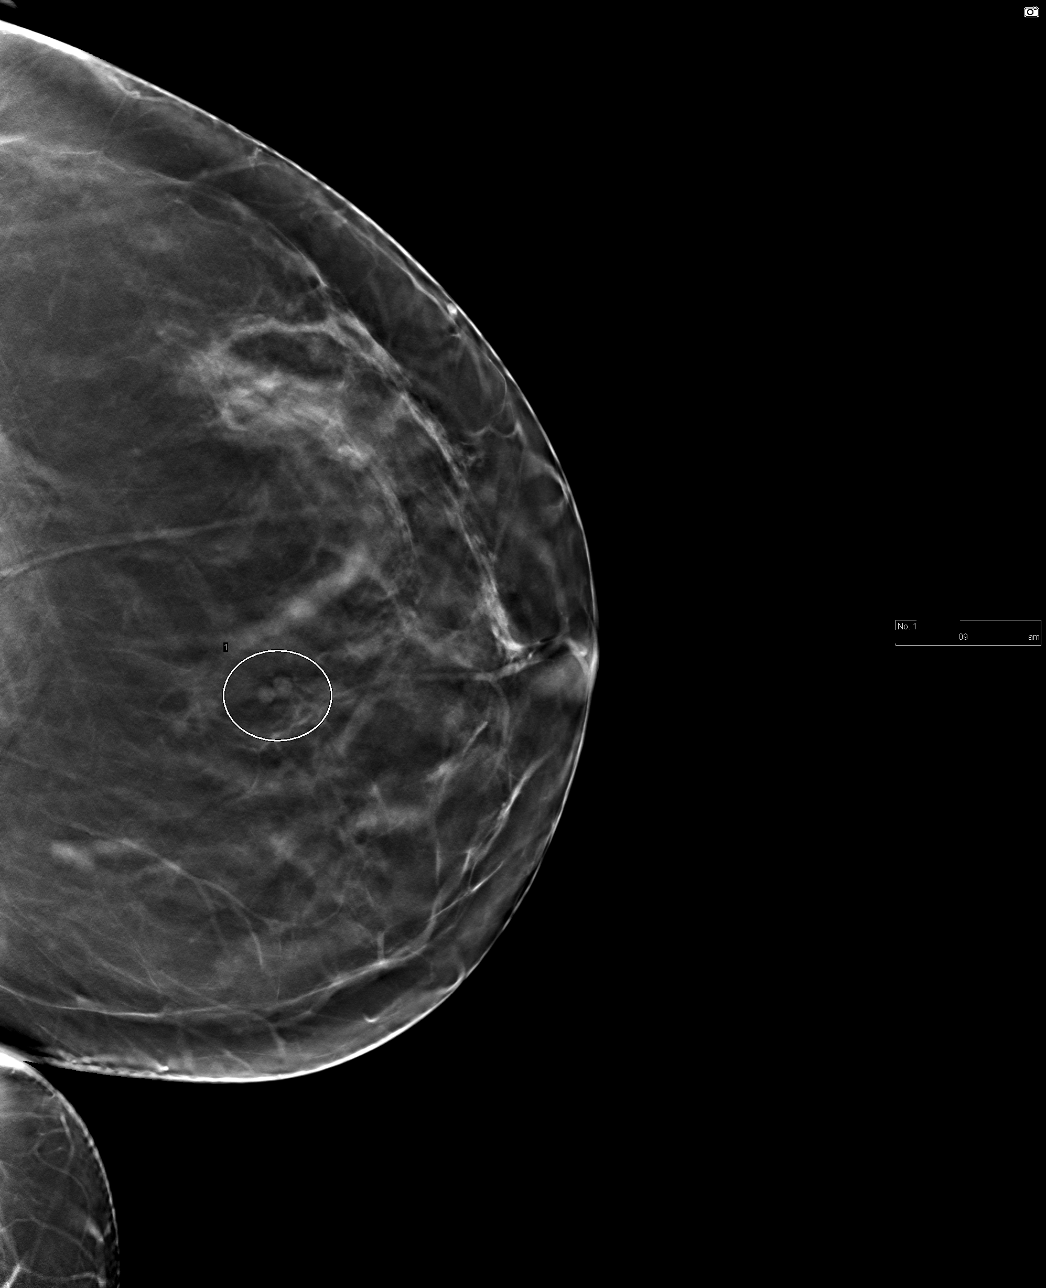

[L MLO (2 of 2)]
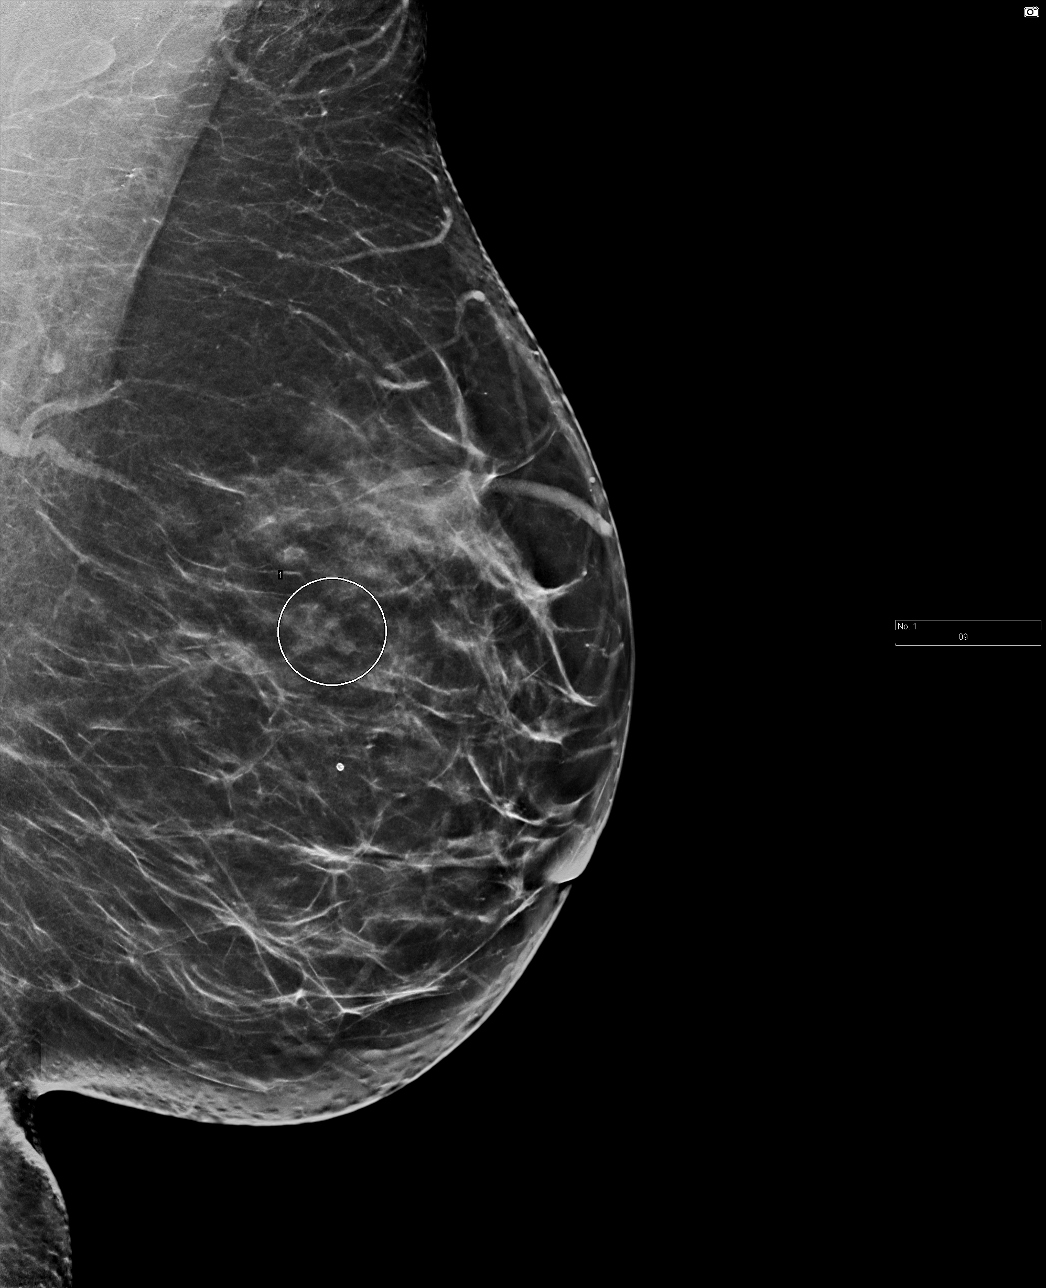

[4 of 4 positions shown; findings below may reference images not displayed]

ACR Breast Density Category b: There are scattered areas of
fibroglandular density.
FINDINGS: In the left breast, a possible mass warrants further evaluation. In
the right breast, no findings suspicious for malignancy.

Images were processed with CAD.
IMPRESSION: Further evaluation is suggested for possible mass in the left
breast.

RECOMMENDATION:
Diagnostic mammogram and possibly ultrasound of the left breast.
(Code:[id number]-[id number])

The patient will be contacted regarding the findings, and additional
imaging will be scheduled.

BI-RADS CATEGORY  0: Incomplete. Need additional imaging evaluation
and/or prior mammograms for comparison.

## 2018-05-11 ENCOUNTER — Ambulatory Visit
Admission: RE | Admit: 2018-05-11 | Discharge: 2018-05-11 | Disposition: A | Discharge status: Home / Self Care | Payer: BLUE CROSS/BLUE SHIELD | Source: Ambulatory Visit | Attending: Obstetrics and Gynecology | Admitting: Obstetrics and Gynecology

## 2018-05-11 DIAGNOSIS — N6320 Unspecified lump in the left breast, unspecified quadrant: Secondary | ICD-10-CM | POA: Insufficient documentation

## 2018-05-11 DIAGNOSIS — N6321 Unspecified lump in the left breast, upper outer quadrant: Secondary | ICD-10-CM | POA: Diagnosis not present

## 2018-05-11 DIAGNOSIS — R928 Other abnormal and inconclusive findings on diagnostic imaging of breast: Secondary | ICD-10-CM | POA: Insufficient documentation

## 2018-05-31 ENCOUNTER — Other Ambulatory Visit: Payer: Self-pay | Admitting: *Deleted

## 2018-05-31 MED ORDER — LEVONORGEST-ETH ESTRAD 91-DAY 0.15-0.03 &0.01 MG PO TABS: 1.0000 | ORAL_TABLET | Freq: Every day | ORAL | 4 refills | Status: DC

## 2018-06-07 ENCOUNTER — Encounter: Payer: Self-pay | Admitting: Family Medicine

## 2018-06-07 ENCOUNTER — Ambulatory Visit (INDEPENDENT_AMBULATORY_CARE_PROVIDER_SITE_OTHER): Payer: BLUE CROSS/BLUE SHIELD | Admitting: Family Medicine

## 2018-06-07 VITALS — BP 100/65 | HR 86 | Temp 97.8°F | Ht 62.3 in | Wt 185.0 lb

## 2018-06-07 DIAGNOSIS — Z0000 Encounter for general adult medical examination without abnormal findings: Secondary | ICD-10-CM | POA: Diagnosis not present

## 2018-06-07 MED ORDER — MONTELUKAST SODIUM 10 MG PO TABS: 10.0000 mg | ORAL_TABLET | Freq: Every day | ORAL | 3 refills | Status: DC

## 2018-06-07 NOTE — Patient Instructions (Signed)
Follow up for CPE 

## 2018-06-07 NOTE — Progress Notes (Signed)
BP 100/65 (BP Location: Right Arm, Patient Position: Sitting, Cuff Size: Normal)   Pulse 86   Temp 97.8 F (36.6 C) (Oral)   Ht 5' 2.3" (1.582 m)   Wt 185 lb (83.9 kg)   SpO2 97%   BMI 33.51 kg/m    Subjective:    Patient ID: Mia Thompson, female    DOB: 1975-09-10, 42 y.o.   MRN: [id number]  HPI: Mia Thompson is a 42 y.o. female presenting on 06/07/2018 for comprehensive medical examination. Current medical complaints include:none  She currently lives with: Menopausal Symptoms: no  Depression Screen done today and results listed below:  Depression screen [facility] 2/9 06/07/2018 06/01/2017  Decreased Interest 2 1  Down, Depressed, Hopeless 3 1  PHQ - 2 Score 5 2  Altered sleeping 2 0  Tired, decreased energy 0 3  Change in appetite 0 3  Feeling bad or failure about yourself  0 1  Trouble concentrating 0 1  Moving slowly or fidgety/restless 0 0  Suicidal thoughts 0 0  PHQ-9 Score 7 10  Difficult doing work/chores - Somewhat difficult    The patient does not have a history of falls. I did not complete a risk assessment for falls. A plan of care for falls was not documented.   Past Medical History:  Past Medical History:  Diagnosis Date  . Anxiety   . Depression   . GERD (gastroesophageal reflux disease)   . Migraines   . Migraines    sees Neuro  . Prolapse of female bladder, acquired     Surgical History:  Past Surgical History:  Procedure Laterality Date  . IUD REMOVAL N/A 05/08/2017   Procedure: INTRAUTERINE DEVICE (IUD) REMOVAL;  Surgeon: Hildred Laser, MD;  Location: [facility];  Service: Gynecology;  Laterality: N/A;  . LAPAROSCOPIC LYSIS OF ADHESIONS  05/08/2017   Procedure: LAPAROSCOPIC LYSIS OF ADHESIONS;  Surgeon: Hildred Laser, MD;  Location: [facility];  Service: Gynecology;;  . LAPAROSCOPY N/A 05/08/2017   Procedure: LAPAROSCOPY OPERATIVE;  Surgeon: Hildred Laser, MD;  Location: [facility];  Service: Gynecology;  Laterality: N/A;  . THUMB SURGERY Left AGE 54    . WISDOM TOOTH EXTRACTION      Medications:  Current Outpatient Medications on File Prior to Visit  Medication Sig  . baclofen (LIORESAL) 10 MG tablet Take 1 tablet (10 mg total) by mouth 2 (two) times daily as needed. (Patient taking differently: Take 10 mg by mouth 2 (two) times daily as needed for muscle spasms. )  . cetirizine (ZYRTEC) 10 MG tablet Take 10 mg by mouth at bedtime.  . cyanocobalamin (,VITAMIN B-12,) 1000 MCG/ML injection Inject 1 mL (1,000 mcg total) into the muscle every 30 (thirty) days.  . DULoxetine (CYMBALTA) 60 MG capsule Take 60 mg by mouth at bedtime.   . fluticasone (FLONASE) 50 MCG/ACT nasal spray Place 2 sprays into both nostrils daily.  . Levonorgestrel-Ethinyl Estradiol (CAMRESE) 0.15-0.03 &0.01 MG tablet Take 1 tablet by mouth daily.  . Multiple Vitamin (MULTI-VITAMINS) TABS Take 1 tablet by mouth daily.   . naproxen (NAPROSYN) 500 MG tablet Take 500 mg by mouth 2 (two) times daily as needed for headache.   . promethazine (PHENERGAN) 25 MG tablet Take 1 tablet (25 mg total) by mouth every 8 (eight) hours as needed for nausea or vomiting.  . rizatriptan (MAXALT) 10 MG tablet Take 1 tablet (10 mg total) by mouth as needed for migraine. May repeat in 2 hours if needed  . topiramate (TOPAMAX) 100  MG tablet Take 100 mg by mouth at bedtime.   . Phendimetrazine Tartrate 105 MG CP24 Take 1 capsule (105 mg total) by mouth daily. (Patient not taking: Reported on 06/07/2018)   No current facility-administered medications on file prior to visit.     Allergies:  Allergies  Allergen Reactions  . Vantin [Cefpodoxime] Rash    Shortness of breath    Social History:  Social History   Socioeconomic History  . Marital status: Married    Spouse name: Not on file  . Number of children: Not on file  . Years of education: Not on file  . Highest education level: Not on file  Occupational History  . Not on file  Social Needs  . Financial resource strain: Not on file   . Food insecurity:    Worry: Not on file    Inability: Not on file  . Transportation needs:    Medical: Not on file    Non-medical: Not on file  Tobacco Use  . Smoking status: Former Games developer  . Smokeless tobacco: Never Used  Substance and Sexual Activity  . Alcohol use: Yes    Comment: occassional  . Drug use: No  . Sexual activity: Yes    Birth control/protection: Pill  Lifestyle  . Physical activity:    Days per week: Not on file    Minutes per session: Not on file  . Stress: Not on file  Relationships  . Social connections:    Talks on phone: Not on file    Gets together: Not on file    Attends religious service: Not on file    Active member of club or organization: Not on file    Attends meetings of clubs or organizations: Not on file    Relationship status: Not on file  . Intimate partner violence:    Fear of current or ex partner: Not on file    Emotionally abused: Not on file    Physically abused: Not on file    Forced sexual activity: Not on file  Other Topics Concern  . Not on file  Social History Narrative  . Not on file   Social History   Tobacco Use  Smoking Status Former Smoker  Smokeless Tobacco Never Used   Social History   Substance and Sexual Activity  Alcohol Use Yes   Comment: occassional    Family History:  Family History  Adopted: Yes    Past medical history, surgical history, medications, allergies, family history and social history reviewed with patient today and changes made to appropriate areas of the chart.   Review of Systems - General ROS: negative Psychological ROS: negative Ophthalmic ROS: negative ENT ROS: negative Allergy and Immunology ROS: negative Hematological and Lymphatic ROS: negative Endocrine ROS: negative Breast ROS: negative for breast lumps Respiratory ROS: no cough, shortness of breath, or wheezing Cardiovascular ROS: no chest pain or dyspnea on exertion Gastrointestinal ROS: no abdominal pain, change in  bowel habits, or black or bloody stools Genito-Urinary ROS: no dysuria, trouble voiding, or hematuria Musculoskeletal ROS: negative Neurological ROS: no TIA or stroke symptoms Dermatological ROS: negative All other ROS negative except what is listed above and in the HPI.      Objective:    BP 100/65 (BP Location: Right Arm, Patient Position: Sitting, Cuff Size: Normal)   Pulse 86   Temp 97.8 F (36.6 C) (Oral)   Ht 5' 2.3" (1.582 m)   Wt 185 lb (83.9 kg)   SpO2 97%  BMI 33.51 kg/m   Wt Readings from Last 3 Encounters:  06/07/18 185 lb (83.9 kg)  04/11/18 183 lb (83 kg)  07/07/17 168 lb (76.2 kg)    Physical Exam  Constitutional: She is oriented to person, place, and time. She appears well-developed and well-nourished. No distress.  HENT:  Head: Atraumatic.  Right Ear: External ear normal.  Left Ear: External ear normal.  Nose: Nose normal.  Mouth/Throat: Oropharynx is clear and moist. No oropharyngeal exudate.  Eyes: Pupils are equal, round, and reactive to light. Conjunctivae are normal. No scleral icterus.  Neck: Normal range of motion. Neck supple. No thyromegaly present.  Cardiovascular: Normal rate, regular rhythm, normal heart sounds and intact distal pulses.  Pulmonary/Chest: Effort normal and breath sounds normal. No respiratory distress.  Breast exam done through GYN  Abdominal: Soft. Bowel sounds are normal. She exhibits no mass. There is no tenderness.  Genitourinary:  Genitourinary Comments: GU exam done through GYN  Musculoskeletal: Normal range of motion. She exhibits no edema or tenderness.  Lymphadenopathy:    She has no cervical adenopathy.  Neurological: She is alert and oriented to person, place, and time. No cranial nerve deficit.  Skin: Skin is warm and dry. No rash noted.  Psychiatric: She has a normal mood and affect. Her behavior is normal.  Nursing note and vitals reviewed.   Results for orders placed or performed in visit on 04/11/18    Thyroid Panel With TSH  Result Value Ref Range   TSH 1.500 0.450 - 4.500 uIU/mL   T4, Total 8.2 4.5 - 12.0 ug/dL   T3 Uptake Ratio 23 (L) 24 - 39 %   Free Thyroxine Index 1.9 1.2 - 4.9  CBC  Result Value Ref Range   WBC 7.9 3.4 - 10.8 x10E3/uL   RBC 4.53 3.77 - 5.28 x10E6/uL   Hemoglobin 13.7 11.1 - 15.9 g/dL   Hematocrit 16.1 09.6 - 46.6 %   MCV 91 79 - 97 fL   MCH 30.2 26.6 - 33.0 pg   MCHC 33.3 31.5 - 35.7 g/dL   RDW 04.5 40.9 - 81.1 %   Platelets 287 150 - 450 x10E3/uL  Lipid panel  Result Value Ref Range   Cholesterol, Total 136 100 - 199 mg/dL   Triglycerides 64 0 - 149 mg/dL   HDL 46 >91 mg/dL   VLDL Cholesterol Cal 13 5 - 40 mg/dL   LDL Calculated 77 0 - 99 mg/dL   Chol/HDL Ratio 3.0 0.0 - 4.4 ratio  Comprehensive metabolic panel  Result Value Ref Range   Glucose 65 65 - 99 mg/dL   BUN 16 6 - 24 mg/dL   Creatinine, Ser 4.78 0.57 - 1.00 mg/dL   GFR calc non Af Amer 81 >59 mL/min/1.73   GFR calc Af Amer 93 >59 mL/min/1.73   BUN/Creatinine Ratio 18 9 - 23   Sodium 139 134 - 144 mmol/L   Potassium 4.1 3.5 - 5.2 mmol/L   Chloride 106 96 - 106 mmol/L   CO2 19 (L) 20 - 29 mmol/L   Calcium 9.0 8.7 - 10.2 mg/dL   Total Protein 6.0 6.0 - 8.5 g/dL   Albumin 3.9 3.5 - 5.5 g/dL   Globulin, Total 2.1 1.5 - 4.5 g/dL   Albumin/Globulin Ratio 1.9 1.2 - 2.2   Bilirubin Total 0.2 0.0 - 1.2 mg/dL   Alkaline Phosphatase 48 39 - 117 IU/L   AST 12 0 - 40 IU/L   ALT 7 0 - 32 IU/L  Hemoglobin A1c  Result Value Ref Range   Hgb A1c MFr Bld 5.1 4.8 - 5.6 %   Est. average glucose Bld gHb Est-mCnc 100 mg/dL  Z61 and Folate Panel  Result Value Ref Range   Vitamin B-12 257 232 - 1,245 pg/mL   Folate 6.6 >3.0 ng/mL      Assessment & Plan:   Problem List Items Addressed This Visit    None    Visit Diagnoses    Annual physical exam    -  Primary   Relevant Orders   CBC with Differential/Platelet   Comprehensive metabolic panel   Lipid Panel w/o Chol/HDL Ratio   TSH   UA/M  w/rflx Culture, Routine       Follow up plan: Return in about 1 year (around 06/08/2019) for CPE.   LABORATORY TESTING:  - Pap smear: up to date  IMMUNIZATIONS:   - Tdap: Tetanus vaccination status reviewed: refused. - Influenza: Will get at work soon  SCREENING: -Mammogram: Up to date   PATIENT COUNSELING:   Advised to take 1 mg of folate supplement per day if capable of pregnancy.   Sexuality: Discussed sexually transmitted diseases, partner selection, use of condoms, avoidance of unintended pregnancy  and contraceptive alternatives.   Advised to avoid cigarette smoking.  I discussed with the patient that most people either abstain from alcohol or drink within safe limits (<=14/week and <=4 drinks/occasion for males, <=7/weeks and <= 3 drinks/occasion for females) and that the risk for alcohol disorders and other health effects rises proportionally with the number of drinks per week and how often a drinker exceeds daily limits.  Discussed cessation/primary prevention of drug use and availability of treatment for abuse.   Diet: Encouraged to adjust caloric intake to maintain  or achieve ideal body weight, to reduce intake of dietary saturated fat and total fat, to limit sodium intake by avoiding high sodium foods and not adding table salt, and to maintain adequate dietary potassium and calcium preferably from fresh fruits, vegetables, and low-fat dairy products.    stressed the importance of regular exercise  Injury prevention: Discussed safety belts, safety helmets, smoke detector, smoking near bedding or upholstery.   Dental health: Discussed importance of regular tooth brushing, flossing, and dental visits.    NEXT PREVENTATIVE PHYSICAL DUE IN 1 YEAR. Return in about 1 year (around 06/08/2019) for CPE.

## 2018-06-08 LAB — CBC WITH DIFFERENTIAL/PLATELET
Basophils Absolute: 0.1 10*3/uL (ref 0.0–0.2)
Basos: 1 %
EOS (ABSOLUTE): 0.5 10*3/uL — ABNORMAL HIGH (ref 0.0–0.4)
EOS: 6 %
HEMATOCRIT: 42.1 % (ref 34.0–46.6)
Hemoglobin: 14.1 g/dL (ref 11.1–15.9)
IMMATURE GRANULOCYTES: 0 %
Immature Grans (Abs): 0 10*3/uL (ref 0.0–0.1)
Lymphocytes Absolute: 2 10*3/uL (ref 0.7–3.1)
Lymphs: 26 %
MCH: 30.3 pg (ref 26.6–33.0)
MCHC: 33.5 g/dL (ref 31.5–35.7)
MCV: 90 fL (ref 79–97)
MONOCYTES: 6 %
Monocytes Absolute: 0.5 10*3/uL (ref 0.1–0.9)
NEUTROS PCT: 61 %
Neutrophils Absolute: 4.8 10*3/uL (ref 1.4–7.0)
Platelets: 322 10*3/uL (ref 150–450)
RBC: 4.66 x10E6/uL (ref 3.77–5.28)
RDW: 12.1 % — AB (ref 12.3–15.4)
WBC: 7.9 10*3/uL (ref 3.4–10.8)

## 2018-06-08 LAB — UA/M W/RFLX CULTURE, ROUTINE
BILIRUBIN UA: NEGATIVE
GLUCOSE, UA: NEGATIVE
KETONES UA: NEGATIVE
Leukocytes, UA: NEGATIVE
NITRITE UA: NEGATIVE
Protein, UA: NEGATIVE
SPEC GRAV UA: 1.025 (ref 1.005–1.030)
UUROB: 2 mg/dL — AB (ref 0.2–1.0)
pH, UA: 6.5 (ref 5.0–7.5)

## 2018-06-08 LAB — COMPREHENSIVE METABOLIC PANEL
A/G RATIO: 2.2 (ref 1.2–2.2)
ALT: 8 IU/L (ref 0–32)
AST: 13 IU/L (ref 0–40)
Albumin: 4.2 g/dL (ref 3.5–5.5)
Alkaline Phosphatase: 52 IU/L (ref 39–117)
BUN/Creatinine Ratio: 22 (ref 9–23)
BUN: 19 mg/dL (ref 6–24)
Bilirubin Total: <0.2 mg/dL (ref 0.0–1.2)
CALCIUM: 8.9 mg/dL (ref 8.7–10.2)
CO2: 19 mmol/L — ABNORMAL LOW (ref 20–29)
Chloride: 108 mmol/L — ABNORMAL HIGH (ref 96–106)
Creatinine, Ser: 0.87 mg/dL (ref 0.57–1.00)
GFR, EST AFRICAN AMERICAN: 96 mL/min/{1.73_m2} (ref >59)
GFR, EST NON AFRICAN AMERICAN: 83 mL/min/{1.73_m2} (ref >59)
GLOBULIN, TOTAL: 1.9 g/dL (ref 1.5–4.5)
Glucose: 94 mg/dL (ref 65–99)
POTASSIUM: 5 mmol/L (ref 3.5–5.2)
SODIUM: 144 mmol/L (ref 134–144)
TOTAL PROTEIN: 6.1 g/dL (ref 6.0–8.5)

## 2018-06-08 LAB — LIPID PANEL W/O CHOL/HDL RATIO
Cholesterol, Total: 138 mg/dL (ref 100–199)
HDL: 40 mg/dL (ref >39)
LDL Calculated: 82 mg/dL (ref 0–99)
Triglycerides: 82 mg/dL (ref 0–149)
VLDL Cholesterol Cal: 16 mg/dL (ref 5–40)

## 2018-06-08 LAB — MICROSCOPIC EXAMINATION: Bacteria, UA: NONE SEEN

## 2018-06-08 LAB — TSH: TSH: 2.43 u[IU]/mL (ref 0.450–4.500)

## 2018-06-12 ENCOUNTER — Other Ambulatory Visit: Payer: Self-pay | Admitting: Obstetrics and Gynecology

## 2018-06-12 MED ORDER — PHENTERMINE HCL 37.5 MG PO TABS: 37.5000 mg | ORAL_TABLET | Freq: Two times a day (BID) | ORAL | 1 refills | Status: DC

## 2018-11-09 ENCOUNTER — Other Ambulatory Visit: Payer: Self-pay | Admitting: *Deleted

## 2018-11-09 MED ORDER — ETONOGESTREL-ETHINYL ESTRADIOL 0.12-0.015 MG/24HR VA RING: VAGINAL_RING | VAGINAL | 12 refills | Status: DC

## 2018-11-16 DIAGNOSIS — G43009 Migraine without aura, not intractable, without status migrainosus: Secondary | ICD-10-CM | POA: Diagnosis not present

## 2019-03-29 ENCOUNTER — Encounter: Payer: Self-pay | Admitting: Nurse Practitioner

## 2019-03-29 ENCOUNTER — Other Ambulatory Visit: Payer: Self-pay

## 2019-03-29 ENCOUNTER — Ambulatory Visit (INDEPENDENT_AMBULATORY_CARE_PROVIDER_SITE_OTHER): Payer: BC Managed Care – PPO | Admitting: Nurse Practitioner

## 2019-03-29 VITALS — BP 95/60 | HR 89 | Temp 97.9°F

## 2019-03-29 DIAGNOSIS — N390 Urinary tract infection, site not specified: Secondary | ICD-10-CM | POA: Insufficient documentation

## 2019-03-29 DIAGNOSIS — N3001 Acute cystitis with hematuria: Secondary | ICD-10-CM

## 2019-03-29 DIAGNOSIS — R350 Frequency of micturition: Secondary | ICD-10-CM | POA: Diagnosis not present

## 2019-03-29 MED ORDER — NITROFURANTOIN MONOHYD MACRO 100 MG PO CAPS: 100.0000 mg | ORAL_CAPSULE | Freq: Two times a day (BID) | ORAL | 0 refills | Status: AC

## 2019-03-29 NOTE — Patient Instructions (Signed)
Urinary Tract Infection, Adult A urinary tract infection (UTI) is an infection of any part of the urinary tract. The urinary tract includes:  The kidneys.  The ureters.  The bladder.  The urethra. These organs make, store, and get rid of pee (urine) in the body. What are the causes? This is caused by germs (bacteria) in your genital area. These germs grow and cause swelling (inflammation) of your urinary tract. What increases the risk? You are more likely to develop this condition if:  You have a small, thin tube (catheter) to drain pee.  You cannot control when you pee or poop (incontinence).  You are female, and: ? You use these methods to prevent pregnancy: ? A medicine that kills sperm (spermicide). ? A device that blocks sperm (diaphragm). ? You have low levels of a female hormone (estrogen). ? You are pregnant.  You have genes that add to your risk.  You are sexually active.  You take antibiotic medicines.  You have trouble peeing because of: ? A prostate that is bigger than normal, if you are female. ? A blockage in the part of your body that drains pee from the bladder (urethra). ? A kidney stone. ? A nerve condition that affects your bladder (neurogenic bladder). ? Not getting enough to drink. ? Not peeing often enough.  You have other conditions, such as: ? Diabetes. ? A weak disease-fighting system (immune system). ? Sickle cell disease. ? Gout. ? Injury of the spine. What are the signs or symptoms? Symptoms of this condition include:  Needing to pee right away (urgently).  Peeing often.  Peeing small amounts often.  Pain or burning when peeing.  Blood in the pee.  Pee that smells bad or not like normal.  Trouble peeing.  Pee that is cloudy.  Fluid coming from the vagina, if you are female.  Pain in the belly or lower back. Other symptoms include:  Throwing up (vomiting).  No urge to eat.  Feeling mixed up (confused).  Being tired  and grouchy (irritable).  A fever.  Watery poop (diarrhea). How is this treated? This condition may be treated with:  Antibiotic medicine.  Other medicines.  Drinking enough water. Follow these instructions at home:  Medicines  Take over-the-counter and prescription medicines only as told by your doctor.  If you were prescribed an antibiotic medicine, take it as told by your doctor. Do not stop taking it even if you start to feel better. General instructions  Make sure you: ? Pee until your bladder is empty. ? Do not hold pee for a long time. ? Empty your bladder after sex. ? Wipe from front to back after pooping if you are a female. Use each tissue one time when you wipe.  Drink enough fluid to keep your pee pale yellow.  Keep all follow-up visits as told by your doctor. This is important. Contact a doctor if:  You do not get better after 1-2 days.  Your symptoms go away and then come back. Get help right away if:  You have very bad back pain.  You have very bad pain in your lower belly.  You have a fever.  You are sick to your stomach (nauseous).  You are throwing up. Summary  A urinary tract infection (UTI) is an infection of any part of the urinary tract.  This condition is caused by germs in your genital area.  There are many risk factors for a UTI. These include having a small, thin   tube to drain pee and not being able to control when you pee or poop.  Treatment includes antibiotic medicines for germs.  Drink enough fluid to keep your pee pale yellow. This information is not intended to replace advice given to you by your health care provider. Make sure you discuss any questions you have with your health care provider. Document Released: 01/11/2008 Document Revised: 07/12/2018 Document Reviewed: 02/01/2018 Elsevier Patient Education  2020 [employer]  

## 2019-03-29 NOTE — Progress Notes (Signed)
BP 95/60   Pulse 89   Temp 97.9 F (36.6 C) (Oral)   SpO2 98%    Subjective:    Patient ID: Mia Thompson, female    DOB: 08/08/1976, 42 y.o.   MRN: [id number]  HPI: Mia Thompson is a 43 y.o. female  Chief Complaint  Patient presents with  . Urinary Tract Infection    pt states she has been having frequency, low back pain, pressure, and burning since Monday   URINARY SYMPTOMS Started having symptoms Monday.  Last UTI a couple months ago.  Denies vaginal discharge. Dysuria: burning Urinary frequency: yes Urgency: yes Small volume voids: yes Symptom severity: yes Urinary incontinence: no Foul odor: no Hematuria: no Abdominal pain: no Back pain: yes Suprapubic pain/pressure: yes Flank pain: no Fever:  no Vomiting: no Relief with cranberry juice: not taking Relief with pyridium: yes Status: better/worse/stable Previous urinary tract infection: yes Recurrent urinary tract infection: in past saw urology Sexual activity: currently sexually active History of sexually transmitted disease: no Treatments attempted: pyridium and increasing fluids   Relevant past medical, surgical, family and social history reviewed and updated as indicated. Interim medical history since our last visit reviewed. Allergies and medications reviewed and updated.  Review of Systems  Constitutional: Negative for activity change, appetite change, diaphoresis, fatigue and fever.  Respiratory: Negative for cough, chest tightness and shortness of breath.   Cardiovascular: Negative for chest pain, palpitations and leg swelling.  Gastrointestinal: Positive for abdominal pain. Negative for abdominal distention, constipation, diarrhea, nausea and vomiting.  Endocrine: Negative for polyphagia.  Genitourinary: Positive for dysuria, frequency and urgency. Negative for flank pain and hematuria.  Musculoskeletal: Positive for back pain.  Neurological: Negative for dizziness, syncope, weakness,  light-headedness, numbness and headaches.  Psychiatric/Behavioral: Negative.     Per HPI unless specifically indicated above     Objective:    BP 95/60   Pulse 89   Temp 97.9 F (36.6 C) (Oral)   SpO2 98%   Wt Readings from Last 3 Encounters:  06/07/18 185 lb (83.9 kg)  04/11/18 183 lb (83 kg)  07/07/17 168 lb (76.2 kg)    Physical Exam Vitals signs and nursing note reviewed.  Constitutional:      General: She is awake. She is not in acute distress.    Appearance: She is well-developed and overweight. She is not ill-appearing.  HENT:     Head: Normocephalic.     Right Ear: Hearing normal.     Left Ear: Hearing normal.  Eyes:     General: Lids are normal.        Right eye: No discharge.        Left eye: No discharge.     Conjunctiva/sclera: Conjunctivae normal.     Pupils: Pupils are equal, round, and reactive to light.  Neck:     Musculoskeletal: Normal range of motion and neck supple.  Cardiovascular:     Rate and Rhythm: Normal rate and regular rhythm.     Heart sounds: Normal heart sounds. No murmur. No gallop.   Pulmonary:     Effort: Pulmonary effort is normal. No accessory muscle usage or respiratory distress.     Breath sounds: Normal breath sounds.  Abdominal:     General: Bowel sounds are normal.     Palpations: Abdomen is soft. There is no hepatomegaly or splenomegaly.     Tenderness: There is abdominal tenderness in the suprapubic area. There is no right CVA tenderness, left CVA tenderness, guarding or  rebound.  Musculoskeletal:     Right lower leg: No edema.     Left lower leg: No edema.  Skin:    General: Skin is warm and dry.  Neurological:     Mental Status: She is alert and oriented to person, place, and time.  Psychiatric:        Attention and Perception: Attention normal.        Mood and Affect: Mood normal.        Behavior: Behavior normal. Behavior is cooperative.        Thought Content: Thought content normal.        Judgment: Judgment  normal.     Results for orders placed or performed in visit on 06/07/18  Microscopic Examination   URINE  Result Value Ref Range   WBC, UA 0-5 0 - 5 /hpf   RBC, UA 3-10 (A) 0 - 2 /hpf   Epithelial Cells (non renal) 0-10 0 - 10 /hpf   Bacteria, UA None seen None seen/Few  CBC with Differential/Platelet  Result Value Ref Range   WBC 7.9 3.4 - 10.8 x10E3/uL   RBC 4.66 3.77 - 5.28 x10E6/uL   Hemoglobin 14.1 11.1 - 15.9 g/dL   Hematocrit 16.142.1 09.634.0 - 46.6 %   MCV 90 79 - 97 fL   MCH 30.3 26.6 - 33.0 pg   MCHC 33.5 31.5 - 35.7 g/dL   RDW 04.512.1 (L) 40.912.3 - 81.115.4 %   Platelets 322 150 - 450 x10E3/uL   Neutrophils 61 Not Estab. %   Lymphs 26 Not Estab. %   Monocytes 6 Not Estab. %   Eos 6 Not Estab. %   Basos 1 Not Estab. %   Neutrophils Absolute 4.8 1.4 - 7.0 x10E3/uL   Lymphocytes Absolute 2.0 0.7 - 3.1 x10E3/uL   Monocytes Absolute 0.5 0.1 - 0.9 x10E3/uL   EOS (ABSOLUTE) 0.5 (H) 0.0 - 0.4 x10E3/uL   Basophils Absolute 0.1 0.0 - 0.2 x10E3/uL   Immature Granulocytes 0 Not Estab. %   Immature Grans (Abs) 0.0 0.0 - 0.1 x10E3/uL  Comprehensive metabolic panel  Result Value Ref Range   Glucose 94 65 - 99 mg/dL   BUN 19 6 - 24 mg/dL   Creatinine, Ser 9.140.87 0.57 - 1.00 mg/dL   GFR calc non Af Amer 83 >59 mL/min/1.73   GFR calc Af Amer 96 >59 mL/min/1.73   BUN/Creatinine Ratio 22 9 - 23   Sodium 144 134 - 144 mmol/L   Potassium 5.0 3.5 - 5.2 mmol/L   Chloride 108 (H) 96 - 106 mmol/L   CO2 19 (L) 20 - 29 mmol/L   Calcium 8.9 8.7 - 10.2 mg/dL   Total Protein 6.1 6.0 - 8.5 g/dL   Albumin 4.2 3.5 - 5.5 g/dL   Globulin, Total 1.9 1.5 - 4.5 g/dL   Albumin/Globulin Ratio 2.2 1.2 - 2.2   Bilirubin Total <0.2 0.0 - 1.2 mg/dL   Alkaline Phosphatase 52 39 - 117 IU/L   AST 13 0 - 40 IU/L   ALT 8 0 - 32 IU/L  Lipid Panel w/o Chol/HDL Ratio  Result Value Ref Range   Cholesterol, Total 138 100 - 199 mg/dL   Triglycerides 82 0 - 149 mg/dL   HDL 40 >78>39 mg/dL   VLDL Cholesterol Cal 16 5 - 40  mg/dL   LDL Calculated 82 0 - 99 mg/dL  TSH  Result Value Ref Range   TSH 2.430 0.450 - 4.500 uIU/mL  UA/M w/rflx Culture,  Routine   Specimen: Urine   URINE  Result Value Ref Range   Specific Gravity, UA 1.025 1.005 - 1.030   pH, UA 6.5 5.0 - 7.5   Color, UA Orange Yellow   Appearance Ur Cloudy (A) Clear   Leukocytes, UA Negative Negative   Protein, UA Negative Negative/Trace   Glucose, UA Negative Negative   Ketones, UA Negative Negative   RBC, UA Trace (A) Negative   Bilirubin, UA Negative Negative   Urobilinogen, Ur 2.0 (H) 0.2 - 1.0 mg/dL   Nitrite, UA Negative Negative   Microscopic Examination See below:       Assessment & Plan:   Problem List Items Addressed This Visit      Genitourinary   UTI (urinary tract infection)    Acute x a few days with UA noting 1+ blood and moderate bacteria.  Script for [employer] sent.  Will adjust regimen dependent on culture results if needed.  Recommend continue pyridium and increased fluid intake.  Daily cranberry pill or Vitamin C.  Return for worsening or continued issues.      Relevant Medications   nitrofurantoin, macrocrystal-monohydrate, (MACROBID) 100 MG capsule    Other Visit Diagnoses    Urinary frequency    -  Primary   Relevant Orders   UA/M w/rflx Culture, Routine       Follow up plan: Return if symptoms worsen or fail to improve.

## 2019-03-29 NOTE — Assessment & Plan Note (Signed)
Acute x a few days with UA noting 1+ blood and moderate bacteria.  Script for [employer] sent.  Will adjust regimen dependent on culture results if needed.  Recommend continue pyridium and increased fluid intake.  Daily cranberry pill or Vitamin C.  Return for worsening or continued issues.

## 2019-03-31 LAB — UA/M W/RFLX CULTURE, ROUTINE
Bilirubin, UA: NEGATIVE
Glucose, UA: NEGATIVE
Ketones, UA: NEGATIVE
Leukocytes,UA: NEGATIVE
Nitrite, UA: NEGATIVE
Protein,UA: NEGATIVE
Specific Gravity, UA: 1.025 (ref 1.005–1.030)
Urobilinogen, Ur: 0.2 mg/dL (ref 0.2–1.0)
pH, UA: 5.5 (ref 5.0–7.5)

## 2019-03-31 LAB — MICROSCOPIC EXAMINATION

## 2019-03-31 LAB — URINE CULTURE, REFLEX

## 2019-04-02 ENCOUNTER — Other Ambulatory Visit: Payer: Self-pay

## 2019-04-02 ENCOUNTER — Ambulatory Visit: Payer: BC Managed Care – PPO

## 2019-04-02 ENCOUNTER — Other Ambulatory Visit: Payer: Self-pay | Admitting: Nurse Practitioner

## 2019-04-02 DIAGNOSIS — N3001 Acute cystitis with hematuria: Secondary | ICD-10-CM

## 2019-04-02 LAB — WET PREP FOR TRICH, YEAST, CLUE
Clue Cell Exam: NEGATIVE
Trichomonas Exam: NEGATIVE
Yeast Exam: NEGATIVE

## 2019-04-02 MED ORDER — AZITHROMYCIN 500 MG PO TABS: 500.0000 mg | ORAL_TABLET | Freq: Every day | ORAL | 0 refills | Status: AC

## 2019-04-02 NOTE — Progress Notes (Signed)
Reviewed patient's labs with her.  Neg urine and wet prep.  She continues to have some lower suprapubic pain, abdominal pain and nausea.  Did report she just got a call from her son's daycare reporting he was not feeling well with gastritis symptoms.  Will have her discontinue Macrobid and change to Azithromycin. If ongoing symptoms to return to office.

## 2019-04-02 NOTE — Progress Notes (Signed)
Wet prep 

## 2019-04-05 DIAGNOSIS — G43709 Chronic migraine without aura, not intractable, without status migrainosus: Secondary | ICD-10-CM | POA: Diagnosis not present

## 2019-04-17 ENCOUNTER — Encounter: Payer: Self-pay | Admitting: Obstetrics and Gynecology

## 2019-05-15 DIAGNOSIS — Z23 Encounter for immunization: Secondary | ICD-10-CM | POA: Diagnosis not present

## 2019-05-22 ENCOUNTER — Other Ambulatory Visit: Payer: Self-pay | Admitting: *Deleted

## 2019-05-22 DIAGNOSIS — Z20822 Contact with and (suspected) exposure to covid-19: Secondary | ICD-10-CM

## 2019-05-22 DIAGNOSIS — Z20828 Contact with and (suspected) exposure to other viral communicable diseases: Secondary | ICD-10-CM | POA: Diagnosis not present

## 2019-05-24 LAB — NOVEL CORONAVIRUS, NAA: SARS-CoV-2, NAA: NOT DETECTED

## 2019-06-07 ENCOUNTER — Encounter: Payer: BC Managed Care – PPO | Admitting: Obstetrics and Gynecology

## 2019-06-13 ENCOUNTER — Encounter: Payer: BC Managed Care – PPO | Admitting: Family Medicine

## 2019-06-24 ENCOUNTER — Encounter: Payer: BC Managed Care – PPO | Admitting: Family Medicine

## 2019-07-16 ENCOUNTER — Telehealth: Payer: Self-pay | Admitting: Certified Nurse Midwife

## 2019-07-16 NOTE — Telephone Encounter (Signed)
Mia Thompson,   I am fine with that. Give her 6 months worth. She can come in for annual in 3-6 months.   Thanks  Deneise Lever

## 2019-07-16 NOTE — Telephone Encounter (Signed)
Called patient to reschedule Annual exam to a day that is further out, Pt stated she can only reschedule if Deneise Lever is able to change her birth control prescription. Pt is currently has the nuva ring and is wanting to switch to [employer]. Please advise.

## 2019-07-17 ENCOUNTER — Telehealth: Payer: Self-pay

## 2019-07-17 ENCOUNTER — Other Ambulatory Visit: Payer: Self-pay

## 2019-07-17 MED ORDER — LEVONORGEST-ETH ESTRAD 91-DAY 0.15-0.03 &0.01 MG PO TABS: 1.0000 | ORAL_TABLET | Freq: Every day | ORAL | 1 refills | Status: DC

## 2019-07-17 NOTE — Telephone Encounter (Signed)
Per AT- seasonique birth control sent to preferred pharmacy per patient request. Mychart message sent to patient.

## 2019-07-25 ENCOUNTER — Encounter: Payer: BC Managed Care – PPO | Admitting: Obstetrics and Gynecology

## 2019-07-26 DIAGNOSIS — Z20828 Contact with and (suspected) exposure to other viral communicable diseases: Secondary | ICD-10-CM | POA: Diagnosis not present

## 2019-07-29 ENCOUNTER — Encounter: Payer: Self-pay | Admitting: Family Medicine

## 2019-07-30 ENCOUNTER — Encounter: Payer: BC Managed Care – PPO | Admitting: Certified Nurse Midwife

## 2019-08-22 ENCOUNTER — Other Ambulatory Visit: Payer: Self-pay

## 2019-08-22 ENCOUNTER — Encounter: Payer: Self-pay | Admitting: Family Medicine

## 2019-08-22 ENCOUNTER — Ambulatory Visit (INDEPENDENT_AMBULATORY_CARE_PROVIDER_SITE_OTHER): Payer: BC Managed Care – PPO | Admitting: Family Medicine

## 2019-08-22 VITALS — BP 105/69 | HR 62 | Temp 97.5°F | Ht 62.3 in | Wt 196.0 lb

## 2019-08-22 DIAGNOSIS — Z1239 Encounter for other screening for malignant neoplasm of breast: Secondary | ICD-10-CM | POA: Diagnosis not present

## 2019-08-22 DIAGNOSIS — F331 Major depressive disorder, recurrent, moderate: Secondary | ICD-10-CM

## 2019-08-22 DIAGNOSIS — G43909 Migraine, unspecified, not intractable, without status migrainosus: Secondary | ICD-10-CM

## 2019-08-22 DIAGNOSIS — K219 Gastro-esophageal reflux disease without esophagitis: Secondary | ICD-10-CM

## 2019-08-22 DIAGNOSIS — J3089 Other allergic rhinitis: Secondary | ICD-10-CM

## 2019-08-22 DIAGNOSIS — Z136 Encounter for screening for cardiovascular disorders: Secondary | ICD-10-CM | POA: Diagnosis not present

## 2019-08-22 DIAGNOSIS — F411 Generalized anxiety disorder: Secondary | ICD-10-CM | POA: Diagnosis not present

## 2019-08-22 DIAGNOSIS — Z0000 Encounter for general adult medical examination without abnormal findings: Secondary | ICD-10-CM

## 2019-08-22 DIAGNOSIS — R079 Chest pain, unspecified: Secondary | ICD-10-CM | POA: Diagnosis not present

## 2019-08-22 LAB — UA/M W/RFLX CULTURE, ROUTINE
Bilirubin, UA: NEGATIVE
Glucose, UA: NEGATIVE
Ketones, UA: NEGATIVE
Leukocytes,UA: NEGATIVE
Nitrite, UA: NEGATIVE
Protein,UA: NEGATIVE
Specific Gravity, UA: 1.025 (ref 1.005–1.030)
Urobilinogen, Ur: 2 mg/dL — ABNORMAL HIGH (ref 0.2–1.0)
pH, UA: 6.5 (ref 5.0–7.5)

## 2019-08-22 LAB — MICROSCOPIC EXAMINATION: WBC, UA: NONE SEEN /hpf (ref 0–5)

## 2019-08-22 MED ORDER — PANTOPRAZOLE SODIUM 40 MG PO TBEC: 40.0000 mg | DELAYED_RELEASE_TABLET | Freq: Every day | ORAL | 1 refills | Status: DC

## 2019-08-22 MED ORDER — ARIPIPRAZOLE 2 MG PO TABS: 2.0000 mg | ORAL_TABLET | Freq: Every day | ORAL | 0 refills | Status: DC

## 2019-08-22 MED ORDER — AIMOVIG 70 MG/ML ~~LOC~~ SOAJ: 70.0000 mg | SUBCUTANEOUS | 2 refills | Status: DC

## 2019-08-22 NOTE — Patient Instructions (Signed)
Please call this number to schedule your mammogram. [phone] 

## 2019-08-22 NOTE — Progress Notes (Signed)
BP 105/69   Pulse 62   Temp (!) 97.5 F (36.4 C) (Oral)   Ht 5' 2.3" (1.582 m)   Wt 196 lb (88.9 kg)   SpO2 99%   BMI 35.50 kg/m    Subjective:    Patient ID: Mia Thompson, female    DOB: 02-Jan-1976, 44 y.o.   MRN: [id number]  HPI: Mia Thompson is a 44 y.o. female presenting on 08/22/2019 for comprehensive medical examination. Current medical complaints include:see below  Burning pain in chest and sometimes abdomen as well. Does not seem to have any sort of pattern to it. Pain comes every few days, sometimes lasts all day and other times only a few hours. Some belching and acid in throat. TUMs seem to help relieve it. Denies SOB, syncope, dizziness.   Stopped cymbalta last year in attempts to lose some weight but that has not seemed to help. Has been feeling very depressed and very anxious since being off. Crying spells, anhedonia, lack of motivation. Used to be on cymbalta for anxiety and migraines which did seem to work fairly well. Currently on topamax from the Neurologist but does not want to go back to Neurologist who prescribes it. Feels that significantly lessen the severity. Has tried to wean off of that but was unable to withstand the severity off of it. Richelle Ito with Neurology in the past as well which didn't help. Migraines significantly poorly controlled at this point, seem to be lasting 5 or so days at a time. Maxalt seems to help for a day or so.   She currently lives with: Menopausal Symptoms: no  Depression Screen done today and results listed below:  Depression screen [facility] 2/9 08/22/2019 06/07/2018 06/01/2017  Decreased Interest 3 2 1   Down, Depressed, Hopeless 3 3 1   PHQ - 2 Score 6 5 2   Altered sleeping 2 2 0  Tired, decreased energy 3 0 3  Change in appetite 3 0 3  Feeling bad or failure about yourself  3 0 1  Trouble concentrating 2 0 1  Moving slowly or fidgety/restless 0 0 0  Suicidal thoughts 0 0 0  PHQ-9 Score 19 7 10   Difficult doing work/chores -  - Somewhat difficult    The patient does not have a history of falls. I did complete a risk assessment for falls. A plan of care for falls was documented.   Past Medical History:  Past Medical History:  Diagnosis Date  . Anxiety   . Depression   . GERD (gastroesophageal reflux disease)   . Migraines   . Migraines    sees Neuro  . Prolapse of female bladder, acquired     Surgical History:  Past Surgical History:  Procedure Laterality Date  . IUD REMOVAL N/A 05/08/2017   Procedure: INTRAUTERINE DEVICE (IUD) REMOVAL;  Surgeon: , MD;  Location: [facility] ORS;  Service: Gynecology;  Laterality: N/A;  . LAPAROSCOPIC LYSIS OF ADHESIONS  05/08/2017   Procedure: LAPAROSCOPIC LYSIS OF ADHESIONS;  Surgeon: , MD;  Location: [facility] ORS;  Service: Gynecology;;  . LAPAROSCOPY N/A 05/08/2017   Procedure: LAPAROSCOPY OPERATIVE;  Surgeon: Hildred Laser, MD;  Location: [facility] ORS;  Service: Gynecology;  Laterality: N/A;  . THUMB SURGERY Left AGE 57  . WISDOM TOOTH EXTRACTION      Medications:  Current Outpatient Medications on File Prior to Visit  Medication Sig  . baclofen (LIORESAL) 10 MG tablet Take 1 tablet (10 mg total) by mouth 2 (two) times daily as needed. (  Patient taking differently: Take 10 mg by mouth 2 (two) times daily as needed for muscle spasms. )  . cetirizine (ZYRTEC) 10 MG tablet Take 10 mg by mouth at bedtime.  . cyanocobalamin (,VITAMIN B-12,) 1000 MCG/ML injection Inject 1 mL (1,000 mcg total) into the muscle every 30 (thirty) days.  . Levonorgestrel-Ethinyl Estradiol (AMETHIA) 0.15-0.03 &0.01 MG tablet Take 1 tablet by mouth at bedtime.  . Multiple Vitamin (MULTI-VITAMINS) TABS Take 1 tablet by mouth daily.   . promethazine (PHENERGAN) 25 MG tablet Take 1 tablet (25 mg total) by mouth every 8 (eight) hours as needed for nausea or vomiting.  . topiramate (TOPAMAX) 200 MG tablet Take 1 tablet by mouth daily.   No current facility-administered medications on  file prior to visit.    Allergies:  Allergies  Allergen Reactions  . Vantin [Cefpodoxime] Rash    Shortness of breath    Social History:  Social History   Socioeconomic History  . Marital status: Married    Spouse name: Not on file  . Number of children: Not on file  . Years of education: Not on file  . Highest education level: Not on file  Occupational History  . Not on file  Tobacco Use  . Smoking status: Former Games developer  . Smokeless tobacco: Never Used  Substance and Sexual Activity  . Alcohol use: Yes    Comment: occassional  . Drug use: No  . Sexual activity: Yes    Birth control/protection: Pill  Other Topics Concern  . Not on file  Social History Narrative  . Not on file   Social Determinants of Health   Financial Resource Strain:   . Difficulty of Paying Living Expenses: Not on file  Food Insecurity:   . Worried About Programme researcher, broadcasting/film/video in the Last Year: Not on file  . Ran Out of Food in the Last Year: Not on file  Transportation Needs:   . Lack of Transportation (Medical): Not on file  . Lack of Transportation (Non-Medical): Not on file  Physical Activity:   . Days of Exercise per Week: Not on file  . Minutes of Exercise per Session: Not on file  Stress:   . Feeling of Stress : Not on file  Social Connections:   . Frequency of Communication with Friends and Family: Not on file  . Frequency of Social Gatherings with Friends and Family: Not on file  . Attends Religious Services: Not on file  . Active Member of Clubs or Organizations: Not on file  . Attends Banker Meetings: Not on file  . Marital Status: Not on file  Intimate Partner Violence:   . Fear of Current or Ex-Partner: Not on file  . Emotionally Abused: Not on file  . Physically Abused: Not on file  . Sexually Abused: Not on file   Social History   Tobacco Use  Smoking Status Former Smoker  Smokeless Tobacco Never Used   Social History   Substance and Sexual Activity    Alcohol Use Yes   Comment: occassional    Family History:  Family History  Adopted: Yes    Past medical history, surgical history, medications, allergies, family history and social history reviewed with patient today and changes made to appropriate areas of the chart.   Review of Systems - General ROS: positive for  - weight gain Psychological ROS: positive for - anxiety, depression and irritability Ophthalmic ROS: negative ENT ROS: negative Allergy and Immunology ROS: negative Hematological and Lymphatic  ROS: negative Endocrine ROS: negative Breast ROS: negative for breast lumps Respiratory ROS: no cough, shortness of breath, or wheezing Cardiovascular ROS: positive for - chest pain Gastrointestinal ROS: no abdominal pain, change in bowel habits, or black or bloody stools Genito-Urinary ROS: no dysuria, trouble voiding, or hematuria Musculoskeletal ROS: negative Neurological ROS: positive for - migraines Dermatological ROS: negative All other ROS negative except what is listed above and in the HPI.      Objective:    BP 105/69   Pulse 62   Temp (!) 97.5 F (36.4 C) (Oral)   Ht 5' 2.3" (1.582 m)   Wt 196 lb (88.9 kg)   SpO2 99%   BMI 35.50 kg/m   Wt Readings from Last 3 Encounters:  08/22/19 196 lb (88.9 kg)  06/07/18 185 lb (83.9 kg)  04/11/18 183 lb (83 kg)    Physical Exam Vitals and nursing note reviewed.  Constitutional:      General: She is not in acute distress.    Appearance: She is well-developed.  HENT:     Head: Atraumatic.     Right Ear: External ear normal.     Left Ear: External ear normal.     Nose: Nose normal.     Mouth/Throat:     Pharynx: No oropharyngeal exudate.  Eyes:     General: No scleral icterus.    Conjunctiva/sclera: Conjunctivae normal.     Pupils: Pupils are equal, round, and reactive to light.  Neck:     Thyroid: No thyromegaly.  Cardiovascular:     Rate and Rhythm: Normal rate and regular rhythm.     Heart sounds:  Normal heart sounds.  Pulmonary:     Effort: Pulmonary effort is normal. No respiratory distress.     Breath sounds: Normal breath sounds.  Chest:     Comments: Breast exam done through GYN Abdominal:     General: Bowel sounds are normal.     Palpations: Abdomen is soft. There is no mass.     Tenderness: There is no abdominal tenderness.  Genitourinary:    Comments: GU exam done through GYN Musculoskeletal:        General: No tenderness. Normal range of motion.     Cervical back: Normal range of motion and neck supple.  Lymphadenopathy:     Cervical: No cervical adenopathy.  Skin:    General: Skin is warm and dry.     Findings: No rash.  Neurological:     Mental Status: She is alert and oriented to person, place, and time.     Cranial Nerves: No cranial nerve deficit.  Psychiatric:        Behavior: Behavior normal.     Results for orders placed or performed in visit on 08/22/19  Microscopic Examination   URINE  Result Value Ref Range   WBC, UA None seen 0 - 5 /hpf   RBC 3-10 (A) 0 - 2 /hpf   Epithelial Cells (non renal) 0-10 0 - 10 /hpf   Bacteria, UA Few (A) None seen/Few  Comprehensive metabolic panel  Result Value Ref Range   Glucose 90 65 - 99 mg/dL   BUN 18 6 - 24 mg/dL   Creatinine, Ser 0.271.02 (H) 0.57 - 1.00 mg/dL   GFR calc non Af Amer 68 >59 mL/min/1.73   GFR calc Af Amer 78 >59 mL/min/1.73   BUN/Creatinine Ratio 18 9 - 23   Sodium 141 134 - 144 mmol/L   Potassium 4.6 3.5 - 5.2  mmol/L   Chloride 109 (H) 96 - 106 mmol/L   CO2 19 (L) 20 - 29 mmol/L   Calcium 9.1 8.7 - 10.2 mg/dL   Total Protein 6.7 6.0 - 8.5 g/dL   Albumin 4.7 3.8 - 4.8 g/dL   Globulin, Total 2.0 1.5 - 4.5 g/dL   Albumin/Globulin Ratio 2.4 (H) 1.2 - 2.2   Bilirubin Total <0.2 0.0 - 1.2 mg/dL   Alkaline Phosphatase 58 39 - 117 IU/L   AST 12 0 - 40 IU/L   ALT 14 0 - 32 IU/L  Lipid Panel w/o Chol/HDL Ratio out  Result Value Ref Range   Cholesterol, Total 153 100 - 199 mg/dL    Triglycerides 94 0 - 149 mg/dL   HDL 40 >67 mg/dL   VLDL Cholesterol Cal 18 5 - 40 mg/dL   LDL Chol Calc (NIH) 95 0 - 99 mg/dL  TSH  Result Value Ref Range   TSH 1.610 0.450 - 4.500 uIU/mL  UA/M w/rflx Culture, Routine   Specimen: Urine   URINE  Result Value Ref Range   Specific Gravity, UA 1.025 1.005 - 1.030   pH, UA 6.5 5.0 - 7.5   Color, UA Yellow Yellow   Appearance Ur Clear Clear   Leukocytes,UA Negative Negative   Protein,UA Negative Negative/Trace   Glucose, UA Negative Negative   Ketones, UA Negative Negative   RBC, UA 1+ (A) Negative   Bilirubin, UA Negative Negative   Urobilinogen, Ur 2.0 (H) 0.2 - 1.0 mg/dL   Nitrite, UA Negative Negative   Microscopic Examination See below:   CBC with Differential/Platelet out  Result Value Ref Range   WBC 7.4 3.4 - 10.8 x10E3/uL   RBC 4.76 3.77 - 5.28 x10E6/uL   Hemoglobin 14.3 11.1 - 15.9 g/dL   Hematocrit 59.1 63.8 - 46.6 %   MCV 91 79 - 97 fL   MCH 30.0 26.6 - 33.0 pg   MCHC 33.2 31.5 - 35.7 g/dL   RDW 46.6 59.9 - 35.7 %   Platelets 263 150 - 450 x10E3/uL   Neutrophils 54 Not Estab. %   Lymphs 33 Not Estab. %   Monocytes 6 Not Estab. %   Eos 6 Not Estab. %   Basos 1 Not Estab. %   Neutrophils Absolute 4.0 1.4 - 7.0 x10E3/uL   Lymphocytes Absolute 2.4 0.7 - 3.1 x10E3/uL   Monocytes Absolute 0.5 0.1 - 0.9 x10E3/uL   EOS (ABSOLUTE) 0.4 0.0 - 0.4 x10E3/uL   Basophils Absolute 0.1 0.0 - 0.2 x10E3/uL   Immature Granulocytes 0 Not Estab. %   Immature Grans (Abs) 0.0 0.0 - 0.1 x10E3/uL      Assessment & Plan:   Problem List Items Addressed This Visit      Cardiovascular and Mediastinum   Migraine    Continue topamax, trial aimovig for further control of sxs      Relevant Medications   Erenumab-aooe (AIMOVIG) 70 MG/ML SOAJ     Respiratory   Allergic rhinitis    Stable and under good control, continue current regimen        Digestive   GERD (gastroesophageal reflux disease)    Protonix, diet modifications,  prn TUMS or pepcid. F/u if not improving. Suspect this is what is causing her chest/epigastric discomfort      Relevant Medications   pantoprazole (PROTONIX) 40 MG tablet     Other   Anxiety, generalized   Depression    Trial abilify, monitor closely for improvement in moods  and irritability       Other Visit Diagnoses    Chest pain, unspecified type    -  Primary   EKG benign, NSR without any acute abnormalities noted. Suspect anxiety or GERD causing her chest pain   Relevant Orders   EKG 12-Lead   EKG 12-Lead (Completed)   Annual physical exam       Relevant Orders   Comprehensive metabolic panel (Completed)   Lipid Panel w/o Chol/HDL Ratio out (Completed)   TSH (Completed)   UA/M w/rflx Culture, Routine (Completed)   CBC with Differential/Platelet out (Completed)   Encounter for screening for malignant neoplasm of breast, unspecified screening modality       Relevant Orders   MM Digital Screening       Follow up plan: Return in about 4 weeks (around 09/19/2019) for Migraine, mood f/u.   LABORATORY TESTING:  - Pap smear: done elsewhere  IMMUNIZATIONS:   - Tdap: Tetanus vaccination status reviewed: refused. - Influenza: Up to date  SCREENING: -Mammogram: Ordered today   PATIENT COUNSELING:   Advised to take 1 mg of folate supplement per day if capable of pregnancy.   Sexuality: Discussed sexually transmitted diseases, partner selection, use of condoms, avoidance of unintended pregnancy  and contraceptive alternatives.   Advised to avoid cigarette smoking.  I discussed with the patient that most people either abstain from alcohol or drink within safe limits (<=14/week and <=4 drinks/occasion for males, <=7/weeks and <= 3 drinks/occasion for females) and that the risk for alcohol disorders and other health effects rises proportionally with the number of drinks per week and how often a drinker exceeds daily limits.  Discussed cessation/primary prevention of drug  use and availability of treatment for abuse.   Diet: Encouraged to adjust caloric intake to maintain  or achieve ideal body weight, to reduce intake of dietary saturated fat and total fat, to limit sodium intake by avoiding high sodium foods and not adding table salt, and to maintain adequate dietary potassium and calcium preferably from fresh fruits, vegetables, and low-fat dairy products.    stressed the importance of regular exercise  Injury prevention: Discussed safety belts, safety helmets, smoke detector, smoking near bedding or upholstery.   Dental health: Discussed importance of regular tooth brushing, flossing, and dental visits.    NEXT PREVENTATIVE PHYSICAL DUE IN 1 YEAR. Return in about 4 weeks (around 09/19/2019) for Migraine, mood f/u.

## 2019-08-23 LAB — LIPID PANEL W/O CHOL/HDL RATIO
Cholesterol, Total: 153 mg/dL (ref 100–199)
HDL: 40 mg/dL (ref >39)
LDL Chol Calc (NIH): 95 mg/dL (ref 0–99)
Triglycerides: 94 mg/dL (ref 0–149)
VLDL Cholesterol Cal: 18 mg/dL (ref 5–40)

## 2019-08-23 LAB — COMPREHENSIVE METABOLIC PANEL
ALT: 14 IU/L (ref 0–32)
AST: 12 IU/L (ref 0–40)
Albumin/Globulin Ratio: 2.4 — ABNORMAL HIGH (ref 1.2–2.2)
Albumin: 4.7 g/dL (ref 3.8–4.8)
Alkaline Phosphatase: 58 IU/L (ref 39–117)
BUN/Creatinine Ratio: 18 (ref 9–23)
BUN: 18 mg/dL (ref 6–24)
Bilirubin Total: <0.2 mg/dL (ref 0.0–1.2)
CO2: 19 mmol/L — ABNORMAL LOW (ref 20–29)
Calcium: 9.1 mg/dL (ref 8.7–10.2)
Chloride: 109 mmol/L — ABNORMAL HIGH (ref 96–106)
Creatinine, Ser: 1.02 mg/dL — ABNORMAL HIGH (ref 0.57–1.00)
GFR calc Af Amer: 78 mL/min/{1.73_m2} (ref >59)
GFR calc non Af Amer: 68 mL/min/{1.73_m2} (ref >59)
Globulin, Total: 2 g/dL (ref 1.5–4.5)
Glucose: 90 mg/dL (ref 65–99)
Potassium: 4.6 mmol/L (ref 3.5–5.2)
Sodium: 141 mmol/L (ref 134–144)
Total Protein: 6.7 g/dL (ref 6.0–8.5)

## 2019-08-23 LAB — CBC WITH DIFFERENTIAL/PLATELET
Basophils Absolute: 0.1 10*3/uL (ref 0.0–0.2)
Basos: 1 %
EOS (ABSOLUTE): 0.4 10*3/uL (ref 0.0–0.4)
Eos: 6 %
Hematocrit: 43.1 % (ref 34.0–46.6)
Hemoglobin: 14.3 g/dL (ref 11.1–15.9)
Immature Grans (Abs): 0 10*3/uL (ref 0.0–0.1)
Immature Granulocytes: 0 %
Lymphocytes Absolute: 2.4 10*3/uL (ref 0.7–3.1)
Lymphs: 33 %
MCH: 30 pg (ref 26.6–33.0)
MCHC: 33.2 g/dL (ref 31.5–35.7)
MCV: 91 fL (ref 79–97)
Monocytes Absolute: 0.5 10*3/uL (ref 0.1–0.9)
Monocytes: 6 %
Neutrophils Absolute: 4 10*3/uL (ref 1.4–7.0)
Neutrophils: 54 %
Platelets: 263 10*3/uL (ref 150–450)
RBC: 4.76 x10E6/uL (ref 3.77–5.28)
RDW: 11.8 % (ref 11.7–15.4)
WBC: 7.4 10*3/uL (ref 3.4–10.8)

## 2019-08-23 LAB — TSH: TSH: 1.61 u[IU]/mL (ref 0.450–4.500)

## 2019-08-26 ENCOUNTER — Telehealth: Payer: Self-pay

## 2019-08-26 ENCOUNTER — Other Ambulatory Visit: Payer: Self-pay

## 2019-08-26 MED ORDER — RIZATRIPTAN BENZOATE 10 MG PO TABS: 10.0000 mg | ORAL_TABLET | ORAL | 2 refills | Status: DC | PRN

## 2019-08-26 MED ORDER — NAPROXEN 500 MG PO TABS: 500.0000 mg | ORAL_TABLET | Freq: Two times a day (BID) | ORAL | 2 refills | Status: DC | PRN

## 2019-08-26 NOTE — Telephone Encounter (Signed)
Last appt 08/22/2019. Next appt 09/18/2018 with Fleet Contras.

## 2019-08-26 NOTE — Telephone Encounter (Signed)
PA started and approved for Aimovig.  Key: [id number]

## 2019-08-28 DIAGNOSIS — F329 Major depressive disorder, single episode, unspecified: Secondary | ICD-10-CM | POA: Insufficient documentation

## 2019-08-28 DIAGNOSIS — K219 Gastro-esophageal reflux disease without esophagitis: Secondary | ICD-10-CM | POA: Insufficient documentation

## 2019-08-28 DIAGNOSIS — F32A Depression, unspecified: Secondary | ICD-10-CM | POA: Insufficient documentation

## 2019-08-28 NOTE — Assessment & Plan Note (Signed)
Protonix, diet modifications, prn TUMS or pepcid. F/u if not improving. Suspect this is what is causing her chest/epigastric discomfort

## 2019-08-28 NOTE — Assessment & Plan Note (Signed)
Stable and under good control, continue current regimen 

## 2019-08-28 NOTE — Assessment & Plan Note (Addendum)
Trial abilify, monitor closely for improvement in moods and irritability

## 2019-08-28 NOTE — Assessment & Plan Note (Addendum)
Continue topamax, trial aimovig for further control of sxs

## 2019-09-16 ENCOUNTER — Other Ambulatory Visit: Payer: Self-pay

## 2019-09-16 ENCOUNTER — Ambulatory Visit: Payer: BC Managed Care – PPO | Admitting: Family Medicine

## 2019-09-16 ENCOUNTER — Encounter: Payer: Self-pay | Admitting: Family Medicine

## 2019-09-16 VITALS — BP 116/74 | HR 85 | Temp 97.5°F | Ht 62.3 in | Wt 193.0 lb

## 2019-09-16 DIAGNOSIS — F331 Major depressive disorder, recurrent, moderate: Secondary | ICD-10-CM | POA: Diagnosis not present

## 2019-09-16 DIAGNOSIS — G4700 Insomnia, unspecified: Secondary | ICD-10-CM | POA: Diagnosis not present

## 2019-09-16 DIAGNOSIS — G43909 Migraine, unspecified, not intractable, without status migrainosus: Secondary | ICD-10-CM | POA: Diagnosis not present

## 2019-09-16 MED ORDER — ARIPIPRAZOLE 5 MG PO TABS: 5.0000 mg | ORAL_TABLET | Freq: Every day | ORAL | 2 refills | Status: DC

## 2019-09-16 NOTE — Assessment & Plan Note (Signed)
Noticeable benefit since starting abilify. Will increase to 5 mg dose and continue cymbalta regimen. Continue to monitor closely

## 2019-09-16 NOTE — Assessment & Plan Note (Signed)
Significant improvement with addition of aimovig. Continue current regimen with close monitoring

## 2019-09-16 NOTE — Progress Notes (Signed)
BP 116/74   Pulse 85   Temp (!) 97.5 F (36.4 C) (Oral)   Ht 5' 2.3" (1.582 m)   Wt 193 lb (87.5 kg)   SpO2 99%   BMI 34.96 kg/m    Subjective:    Patient ID: Mia Thompson, female    DOB: 1976/01/20, 44 y.o.   MRN: [id number]  HPI: Mia Thompson is a 44 y.o. female  Chief Complaint  Patient presents with  . Migraine  . Depression  . Anxiety   Patient presenting today for 1 month f/u migraines and moods.   Started on Aimovig in addition to her topamax and cymbalta given long hx of severe persistent migrianes - has only had 2 mild migraines since starting. Tolerating great without side effects. Very pleased with how she's felt since starting.   Abilify started in addition to cymbalta for moods and irritability and she's noticed a huge improvement since starting. Has found motivation to exercise, cleaning her house again, and feeling much more calm throughout the day in her interactions with people. Denies side effects other than some restlessness overnight at times now which is new but not severe. Taking melatonin for this. Denies SI/HI.   Depression screen [facility] 2/9 08/22/2019 06/07/2018 06/01/2017  Decreased Interest 3 2 1   Down, Depressed, Hopeless 3 3 1   PHQ - 2 Score 6 5 2   Altered sleeping 2 2 0  Tired, decreased energy 3 0 3  Change in appetite 3 0 3  Feeling bad or failure about yourself  3 0 1  Trouble concentrating 2 0 1  Moving slowly or fidgety/restless 0 0 0  Suicidal thoughts 0 0 0  PHQ-9 Score 19 7 10   Difficult doing work/chores - - Somewhat difficult   GAD 7 : Generalized Anxiety Score 09/16/2019 08/22/2019 06/07/2018 06/01/2017  Nervous, Anxious, on Edge 0 3 1 3   Control/stop worrying 0 1 1 1   Worry too much - different things 0 1 1 2   Trouble relaxing 1 3 1 2   Restless 1 1 0 1  Easily annoyed or irritable 1 3 1 3   Afraid - awful might happen - 2 1 3   Total GAD 7 Score - 14 6 15   Anxiety Difficulty - Very difficult - Somewhat difficult        Relevant past medical, surgical, family and social history reviewed and updated as indicated. Interim medical history since our last visit reviewed. Allergies and medications reviewed and updated.  Review of Systems  Per HPI unless specifically indicated above     Objective:    BP 116/74   Pulse 85   Temp (!) 97.5 F (36.4 C) (Oral)   Ht 5' 2.3" (1.582 m)   Wt 193 lb (87.5 kg)   SpO2 99%   BMI 34.96 kg/m   Wt Readings from Last 3 Encounters:  09/16/19 193 lb (87.5 kg)  08/22/19 196 lb (88.9 kg)  06/07/18 185 lb (83.9 kg)    Physical Exam Vitals and nursing note reviewed.  Constitutional:      Appearance: Normal appearance. She is not ill-appearing.  HENT:     Head: Atraumatic.  Eyes:     Extraocular Movements: Extraocular movements intact.     Conjunctiva/sclera: Conjunctivae normal.  Cardiovascular:     Rate and Rhythm: Normal rate and regular rhythm.     Heart sounds: Normal heart sounds.  Pulmonary:     Effort: Pulmonary effort is normal.     Breath sounds: Normal breath sounds.  Musculoskeletal:        General: Normal range of motion.     Cervical back: Normal range of motion and neck supple.  Skin:    General: Skin is warm and dry.  Neurological:     General: No focal deficit present.     Mental Status: She is alert and oriented to person, place, and time.  Psychiatric:        Mood and Affect: Mood normal.        Thought Content: Thought content normal.        Judgment: Judgment normal.     Results for orders placed or performed in visit on 08/22/19  Microscopic Examination   URINE  Result Value Ref Range   WBC, UA None seen 0 - 5 /hpf   RBC 3-10 (A) 0 - 2 /hpf   Epithelial Cells (non renal) 0-10 0 - 10 /hpf   Bacteria, UA Few (A) None seen/Few  Comprehensive metabolic panel  Result Value Ref Range   Glucose 90 65 - 99 mg/dL   BUN 18 6 - 24 mg/dL   Creatinine, Ser 3.47 (H) 0.57 - 1.00 mg/dL   GFR calc non Af Amer 68 >59 mL/min/1.73   GFR calc  Af Amer 78 >59 mL/min/1.73   BUN/Creatinine Ratio 18 9 - 23   Sodium 141 134 - 144 mmol/L   Potassium 4.6 3.5 - 5.2 mmol/L   Chloride 109 (H) 96 - 106 mmol/L   CO2 19 (L) 20 - 29 mmol/L   Calcium 9.1 8.7 - 10.2 mg/dL   Total Protein 6.7 6.0 - 8.5 g/dL   Albumin 4.7 3.8 - 4.8 g/dL   Globulin, Total 2.0 1.5 - 4.5 g/dL   Albumin/Globulin Ratio 2.4 (H) 1.2 - 2.2   Bilirubin Total <0.2 0.0 - 1.2 mg/dL   Alkaline Phosphatase 58 39 - 117 IU/L   AST 12 0 - 40 IU/L   ALT 14 0 - 32 IU/L  Lipid Panel w/o Chol/HDL Ratio out  Result Value Ref Range   Cholesterol, Total 153 100 - 199 mg/dL   Triglycerides 94 0 - 149 mg/dL   HDL 40 >42 mg/dL   VLDL Cholesterol Cal 18 5 - 40 mg/dL   LDL Chol Calc (NIH) 95 0 - 99 mg/dL  TSH  Result Value Ref Range   TSH 1.610 0.450 - 4.500 uIU/mL  UA/M w/rflx Culture, Routine   Specimen: Urine   URINE  Result Value Ref Range   Specific Gravity, UA 1.025 1.005 - 1.030   pH, UA 6.5 5.0 - 7.5   Color, UA Yellow Yellow   Appearance Ur Clear Clear   Leukocytes,UA Negative Negative   Protein,UA Negative Negative/Trace   Glucose, UA Negative Negative   Ketones, UA Negative Negative   RBC, UA 1+ (A) Negative   Bilirubin, UA Negative Negative   Urobilinogen, Ur 2.0 (H) 0.2 - 1.0 mg/dL   Nitrite, UA Negative Negative   Microscopic Examination See below:   CBC with Differential/Platelet out  Result Value Ref Range   WBC 7.4 3.4 - 10.8 x10E3/uL   RBC 4.76 3.77 - 5.28 x10E6/uL   Hemoglobin 14.3 11.1 - 15.9 g/dL   Hematocrit 59.5 63.8 - 46.6 %   MCV 91 79 - 97 fL   MCH 30.0 26.6 - 33.0 pg   MCHC 33.2 31.5 - 35.7 g/dL   RDW 75.6 43.3 - 29.5 %   Platelets 263 150 - 450 x10E3/uL   Neutrophils 54 Not Estab. %  Lymphs 33 Not Estab. %   Monocytes 6 Not Estab. %   Eos 6 Not Estab. %   Basos 1 Not Estab. %   Neutrophils Absolute 4.0 1.4 - 7.0 x10E3/uL   Lymphocytes Absolute 2.4 0.7 - 3.1 x10E3/uL   Monocytes Absolute 0.5 0.1 - 0.9 x10E3/uL   EOS (ABSOLUTE) 0.4  0.0 - 0.4 x10E3/uL   Basophils Absolute 0.1 0.0 - 0.2 x10E3/uL   Immature Granulocytes 0 Not Estab. %   Immature Grans (Abs) 0.0 0.0 - 0.1 x10E3/uL      Assessment & Plan:   Problem List Items Addressed This Visit      Cardiovascular and Mediastinum   Migraine    Significant improvement with addition of aimovig. Continue current regimen with close monitoring      Relevant Medications   DULoxetine (CYMBALTA) 60 MG capsule     Other   Depression - Primary    Noticeable benefit since starting abilify. Will increase to 5 mg dose and continue cymbalta regimen. Continue to monitor closely      Relevant Medications   DULoxetine (CYMBALTA) 60 MG capsule    Other Visit Diagnoses    Insomnia, unspecified type       ? side effect from a new medication vs situational. Melatonin, unisom, sleep hygiene reviewed. Continue to monitor and will make adjustments if persisting       Follow up plan: Return in about 3 months (around 12/14/2019) for Migraine and mood f/u.

## 2019-09-19 ENCOUNTER — Ambulatory Visit: Payer: BC Managed Care – PPO | Admitting: Family Medicine

## 2019-10-07 ENCOUNTER — Other Ambulatory Visit: Payer: Self-pay

## 2019-10-07 NOTE — Telephone Encounter (Signed)
Refill request for Topiramate LOV: 09/16/2019 Next Appt: 12/19/2019

## 2019-10-08 MED ORDER — TOPIRAMATE 200 MG PO TABS: 200.0000 mg | ORAL_TABLET | Freq: Every day | ORAL | 0 refills | Status: DC

## 2019-11-07 ENCOUNTER — Other Ambulatory Visit: Payer: Self-pay | Admitting: Family Medicine

## 2019-11-07 NOTE — Telephone Encounter (Signed)
Requested medication (s) are due for refill today: yes  Requested medication (s) are on the active medication list: yes  Last refill:  08/22/19  Future visit scheduled: yes  Notes to clinic:  Off protocol    Requested Prescriptions  Pending Prescriptions Disp Refills   AIMOVIG 70 MG/ML SOAJ [Pharmacy Med Name: AIMOVIG 70 MG/ML AUTOINJECTOR] 1 mL 0    Sig: Inject 70 mg into the skin every 30 (thirty) days.      Off-Protocol Failed - 11/07/2019  4:02 PM      Failed - Medication not assigned to a protocol, review manually.      Passed - Valid encounter within last 12 months    Recent Outpatient Visits           1 month ago Moderate episode of recurrent major depressive disorder [facility])   [facility] [name], [place]   2 months ago Chest pain, unspecified type   [facility], [place], [place]   7 months ago Urinary frequency   [facility] [place], Corrie Dandy T, NP   1 year ago Annual physical exam   [facility] [name], [place]   2 years ago Annual physical exam   [facility] [name], [place]       Future Appointments             In 1 month Maurice March, [name], PA-C [facility]   In 2 months Doreene Burke, CNM [facility]

## 2019-11-11 ENCOUNTER — Telehealth: Payer: Self-pay

## 2019-11-11 NOTE — Telephone Encounter (Signed)
Prior Authorization initiated via CoverMyMeds for Aimovig 70MG /ML auto-injectors Key: [id number]

## 2019-11-15 NOTE — Telephone Encounter (Signed)
PA Approved

## 2019-11-20 ENCOUNTER — Telehealth: Payer: Self-pay | Admitting: Family Medicine

## 2019-11-20 NOTE — Telephone Encounter (Signed)
Pt called in to inquire about switching to 2mg  abilify instead of 5 mg due to it making her sleepy. Please advise.

## 2019-11-21 MED ORDER — ARIPIPRAZOLE 2 MG PO TABS: 2.0000 mg | ORAL_TABLET | Freq: Every day | ORAL | 1 refills | Status: DC

## 2019-11-21 NOTE — Telephone Encounter (Signed)
Patient notified

## 2019-11-21 NOTE — Telephone Encounter (Signed)
Lower dose sent

## 2019-11-22 ENCOUNTER — Telehealth: Payer: Self-pay | Admitting: Family Medicine

## 2019-11-22 NOTE — Telephone Encounter (Signed)
error 

## 2019-12-10 ENCOUNTER — Other Ambulatory Visit: Payer: Self-pay | Admitting: Family Medicine

## 2019-12-10 NOTE — Telephone Encounter (Signed)
Routing to provider  

## 2019-12-10 NOTE — Telephone Encounter (Signed)
Requested medication (s) are due for refill today: yes  Requested medication (s) are on the active medication list: yes  Last refill:  09/02/2019  Future visit scheduled: yes  Notes to clinic: review for refill Last filled by historical provider    Requested Prescriptions  Pending Prescriptions Disp Refills   DULoxetine (CYMBALTA) 60 MG capsule [Pharmacy Med Name: DULOXETINE HCL DR 60 MG CAP] 90 capsule 0    Sig: Take 1 capsule (60 mg total) by mouth daily.      Psychiatry: Antidepressants - SNRI Passed - 12/10/2019  9:40 AM      Passed - Completed PHQ-2 or PHQ-9 in the last 360 days.      Passed - Last BP in normal range    BP Readings from Last 1 Encounters:  09/16/19 116/74          Passed - Valid encounter within last 6 months    Recent Outpatient Visits           2 months ago Moderate episode of recurrent major depressive disorder [facility])   [facility] [name], [place]   3 months ago Chest pain, unspecified type   [place], [place], [place]   8 months ago Urinary frequency   [facility] [place], Corrie Dandy T, NP   1 year ago Annual physical exam   [facility] [name], [place]   2 years ago Annual physical exam   [facility] [name], [place]       Future Appointments             In 1 week Maurice March, [name], PA-C [facility]   In 1 month Doreene Burke, CNM [facility]

## 2019-12-19 ENCOUNTER — Other Ambulatory Visit: Payer: Self-pay

## 2019-12-19 ENCOUNTER — Encounter: Payer: Self-pay | Admitting: Family Medicine

## 2019-12-19 ENCOUNTER — Ambulatory Visit: Payer: BC Managed Care – PPO | Admitting: Family Medicine

## 2019-12-19 VITALS — BP 107/74 | HR 76 | Temp 97.5°F | Wt 185.0 lb

## 2019-12-19 DIAGNOSIS — G4700 Insomnia, unspecified: Secondary | ICD-10-CM | POA: Diagnosis not present

## 2019-12-19 DIAGNOSIS — F331 Major depressive disorder, recurrent, moderate: Secondary | ICD-10-CM | POA: Diagnosis not present

## 2019-12-19 DIAGNOSIS — G43909 Migraine, unspecified, not intractable, without status migrainosus: Secondary | ICD-10-CM

## 2019-12-19 MED ORDER — ARIPIPRAZOLE 2 MG PO TABS: 2.0000 mg | ORAL_TABLET | Freq: Every day | ORAL | 1 refills | Status: DC

## 2019-12-19 MED ORDER — DULOXETINE HCL 60 MG PO CPEP: ORAL_CAPSULE | ORAL | 1 refills | Status: DC

## 2019-12-19 MED ORDER — AIMOVIG 70 MG/ML ~~LOC~~ SOAJ: 70.0000 mg | SUBCUTANEOUS | 5 refills | Status: DC

## 2019-12-19 MED ORDER — TOPIRAMATE 200 MG PO TABS: 200.0000 mg | ORAL_TABLET | Freq: Every day | ORAL | 1 refills | Status: DC

## 2019-12-19 NOTE — Progress Notes (Signed)
BP 107/74   Pulse 76   Temp (!) 97.5 F (36.4 C)   Wt 185 lb (83.9 kg)   SpO2 98%   BMI 33.51 kg/m    Subjective:    Patient ID: Mia Thompson, female    DOB: 01/14/1976, 44 y.o.   MRN: [id number]  HPI: Mia Thompson is a 44 y.o. female  Chief Complaint  Patient presents with  . Migraine  . Depression   Presenting today for mood, migraine f/u.   Has not had a migraine in months. Has not really needed the maxalt since starting aimovig and is very pleased with how well it's been doing. Denies side effects or concerns. Still on topamax and cymablta additionally.   Insomnia still persists. Has tried supplements and melatonin in the past which was not very helpful, and has had a home sleep study in the past which was negative for sleep apnea.   Moods much improved with addition of abilify. Denies side effects, notes excellent improvement in agitation and anger issues she was noticing. Denies SI/HI.   Depression screen [facility] 2/9 12/19/2019 08/22/2019 06/07/2018  Decreased Interest 0 3 2  Down, Depressed, Hopeless 0 3 3  PHQ - 2 Score 0 6 5  Altered sleeping 3 2 2   Tired, decreased energy 3 3 0  Change in appetite 3 3 0  Feeling bad or failure about yourself  0 3 0  Trouble concentrating 0 2 0  Moving slowly or fidgety/restless 0 0 0  Suicidal thoughts 0 0 0  PHQ-9 Score 9 19 7   Difficult doing work/chores Not difficult at all - -    Relevant past medical, surgical, family and social history reviewed and updated as indicated. Interim medical history since our last visit reviewed. Allergies and medications reviewed and updated.  Review of Systems  Per HPI unless specifically indicated above     Objective:    BP 107/74   Pulse 76   Temp (!) 97.5 F (36.4 C)   Wt 185 lb (83.9 kg)   SpO2 98%   BMI 33.51 kg/m   Wt Readings from Last 3 Encounters:  12/19/19 185 lb (83.9 kg)  09/16/19 193 lb (87.5 kg)  08/22/19 196 lb (88.9 kg)    Physical Exam Vitals and nursing  note reviewed.  Constitutional:      Appearance: Normal appearance. She is not ill-appearing.  HENT:     Head: Atraumatic.  Eyes:     Extraocular Movements: Extraocular movements intact.     Conjunctiva/sclera: Conjunctivae normal.  Cardiovascular:     Rate and Rhythm: Normal rate and regular rhythm.     Heart sounds: Normal heart sounds.  Pulmonary:     Effort: Pulmonary effort is normal.     Breath sounds: Normal breath sounds.  Musculoskeletal:        General: Normal range of motion.     Cervical back: Normal range of motion and neck supple.  Skin:    General: Skin is warm and dry.  Neurological:     Mental Status: She is alert and oriented to person, place, and time.  Psychiatric:        Mood and Affect: Mood normal.        Thought Content: Thought content normal.        Judgment: Judgment normal.     Results for orders placed or performed in visit on 08/22/19  Microscopic Examination   URINE  Result Value Ref Range   WBC, UA None  seen 0 - 5 /hpf   RBC 3-10 (A) 0 - 2 /hpf   Epithelial Cells (non renal) 0-10 0 - 10 /hpf   Bacteria, UA Few (A) None seen/Few  Comprehensive metabolic panel  Result Value Ref Range   Glucose 90 65 - 99 mg/dL   BUN 18 6 - 24 mg/dL   Creatinine, Ser 9.32 (H) 0.57 - 1.00 mg/dL   GFR calc non Af Amer 68 >59 mL/min/1.73   GFR calc Af Amer 78 >59 mL/min/1.73   BUN/Creatinine Ratio 18 9 - 23   Sodium 141 134 - 144 mmol/L   Potassium 4.6 3.5 - 5.2 mmol/L   Chloride 109 (H) 96 - 106 mmol/L   CO2 19 (L) 20 - 29 mmol/L   Calcium 9.1 8.7 - 10.2 mg/dL   Total Protein 6.7 6.0 - 8.5 g/dL   Albumin 4.7 3.8 - 4.8 g/dL   Globulin, Total 2.0 1.5 - 4.5 g/dL   Albumin/Globulin Ratio 2.4 (H) 1.2 - 2.2   Bilirubin Total <0.2 0.0 - 1.2 mg/dL   Alkaline Phosphatase 58 39 - 117 IU/L   AST 12 0 - 40 IU/L   ALT 14 0 - 32 IU/L  Lipid Panel w/o Chol/HDL Ratio out  Result Value Ref Range   Cholesterol, Total 153 100 - 199 mg/dL   Triglycerides 94 0 - 149  mg/dL   HDL 40 >67 mg/dL   VLDL Cholesterol Cal 18 5 - 40 mg/dL   LDL Chol Calc (NIH) 95 0 - 99 mg/dL  TSH  Result Value Ref Range   TSH 1.610 0.450 - 4.500 uIU/mL  UA/M w/rflx Culture, Routine   Specimen: Urine   URINE  Result Value Ref Range   Specific Gravity, UA 1.025 1.005 - 1.030   pH, UA 6.5 5.0 - 7.5   Color, UA Yellow Yellow   Appearance Ur Clear Clear   Leukocytes,UA Negative Negative   Protein,UA Negative Negative/Trace   Glucose, UA Negative Negative   Ketones, UA Negative Negative   RBC, UA 1+ (A) Negative   Bilirubin, UA Negative Negative   Urobilinogen, Ur 2.0 (H) 0.2 - 1.0 mg/dL   Nitrite, UA Negative Negative   Microscopic Examination See below:   CBC with Differential/Platelet out  Result Value Ref Range   WBC 7.4 3.4 - 10.8 x10E3/uL   RBC 4.76 3.77 - 5.28 x10E6/uL   Hemoglobin 14.3 11.1 - 15.9 g/dL   Hematocrit 12.4 58.0 - 46.6 %   MCV 91 79 - 97 fL   MCH 30.0 26.6 - 33.0 pg   MCHC 33.2 31.5 - 35.7 g/dL   RDW 99.8 33.8 - 25.0 %   Platelets 263 150 - 450 x10E3/uL   Neutrophils 54 Not Estab. %   Lymphs 33 Not Estab. %   Monocytes 6 Not Estab. %   Eos 6 Not Estab. %   Basos 1 Not Estab. %   Neutrophils Absolute 4.0 1.4 - 7.0 x10E3/uL   Lymphocytes Absolute 2.4 0.7 - 3.1 x10E3/uL   Monocytes Absolute 0.5 0.1 - 0.9 x10E3/uL   EOS (ABSOLUTE) 0.4 0.0 - 0.4 x10E3/uL   Basophils Absolute 0.1 0.0 - 0.2 x10E3/uL   Immature Granulocytes 0 Not Estab. %   Immature Grans (Abs) 0.0 0.0 - 0.1 x10E3/uL      Assessment & Plan:   Problem List Items Addressed This Visit      Cardiovascular and Mediastinum   Migraine - Primary    Significant improvement since being on aimovig,  continue current regimen      Relevant Medications   Erenumab-aooe (AIMOVIG) 70 MG/ML SOAJ   topiramate (TOPAMAX) 200 MG tablet   DULoxetine (CYMBALTA) 60 MG capsule     Other   Depression    Significant benefit with addition of abilify, continue current regimen with close  monitoring      Relevant Medications   DULoxetine (CYMBALTA) 60 MG capsule   Insomnia    Will continue to try OTC remedies and good sleep hygiene for now. F/u if not improving for more recommendations          Follow up plan: Return in about 6 months (around 06/20/2020) for 6 month f/u.

## 2019-12-30 NOTE — Assessment & Plan Note (Signed)
Significant benefit with addition of abilify, continue current regimen with close monitoring

## 2019-12-30 NOTE — Assessment & Plan Note (Signed)
Will continue to try OTC remedies and good sleep hygiene for now. F/u if not improving for more recommendations

## 2019-12-30 NOTE — Assessment & Plan Note (Signed)
Significant improvement since being on aimovig, continue current regimen

## 2020-01-22 ENCOUNTER — Encounter: Payer: BC Managed Care – PPO | Admitting: Certified Nurse Midwife

## 2020-01-28 ENCOUNTER — Other Ambulatory Visit: Payer: Self-pay | Admitting: Certified Nurse Midwife

## 2020-02-06 ENCOUNTER — Other Ambulatory Visit: Payer: Self-pay | Admitting: Family Medicine

## 2020-02-06 DIAGNOSIS — R928 Other abnormal and inconclusive findings on diagnostic imaging of breast: Secondary | ICD-10-CM

## 2020-02-06 DIAGNOSIS — Z1231 Encounter for screening mammogram for malignant neoplasm of breast: Secondary | ICD-10-CM

## 2020-02-06 DIAGNOSIS — N63 Unspecified lump in unspecified breast: Secondary | ICD-10-CM

## 2020-02-12 ENCOUNTER — Ambulatory Visit (INDEPENDENT_AMBULATORY_CARE_PROVIDER_SITE_OTHER): Payer: BC Managed Care – PPO | Admitting: Certified Nurse Midwife

## 2020-02-12 ENCOUNTER — Encounter: Payer: Self-pay | Admitting: Certified Nurse Midwife

## 2020-02-12 ENCOUNTER — Other Ambulatory Visit: Payer: Self-pay

## 2020-02-12 VITALS — BP 100/63 | HR 94 | Ht 62.0 in | Wt 192.2 lb

## 2020-02-12 DIAGNOSIS — Z01419 Encounter for gynecological examination (general) (routine) without abnormal findings: Secondary | ICD-10-CM | POA: Diagnosis not present

## 2020-02-12 MED ORDER — LEVONORGEST-ETH ESTRAD 91-DAY 0.15-0.03 &0.01 MG PO TABS: 1.0000 | ORAL_TABLET | Freq: Every day | ORAL | 5 refills | Status: DC

## 2020-02-12 NOTE — Progress Notes (Signed)
GYNECOLOGY ANNUAL PREVENTATIVE CARE ENCOUNTER NOTE  History:     Mia Thompson is a 44 y.o. [id number] female here for a routine annual gynecologic exam.  Current complaints: hot flashes at night and weight gain. Pt states due to inactivity and diet. She is interested in wt loss program. Has done in the past with Melody and was sucessful. Feels like she needs a boots to get started.    Denies abnormal vaginal bleeding, discharge, pelvic pain, problems with intercourse or other gynecologic concerns.     Social Relationship: married  Living: husband and children Work: Associate Professor. Exercise: not really, has gained wt and is interested in wt loss program Smoke/ALcohol/Drug use: occasional alcohol  Gynecologic History No LMP recorded. (Menstrual status: Oral contraceptives). Contraception: OCP (estrogen/progesterone) Last Pap: 08/25/15 Results were: normal with negative HPV Last mammogram: 05/2018  Results were:  In the left breast, a possible mass warrants further evaluation. In the right breast, no findings suspicious for malignancy.}Adjacent likely benign complex cysts in the UPPER OUTER QUADRANT of the LEFT breast at the 1 o'clock position approximately 7 cm and 8 cm from the nipple which accounts for the baseline screening mammographic finding.  Obstetric History OB History  Gravida Para Term Preterm AB Living  3 3 3     3   SAB TAB Ectopic Multiple Live Births          3    # Outcome Date GA Lbr Len/2nd Weight Sex Delivery Anes PTL Lv  3 Term 02/09/10   7 lb 6 oz (3.345 kg) M Vag-Spont  N LIV  2 Term 02/26/04   8 lb 6 oz (3.799 kg) F Vag-Spont  N LIV  1 Term 11/21/00   7 lb 11 oz (3.487 kg) F Vag-Spont  N LIV    Past Medical History:  Diagnosis Date  . Anxiety   . Depression   . GERD (gastroesophageal reflux disease)   . Migraines   . Migraines    sees Neuro  . Prolapse of female bladder, acquired     Past Surgical History:  Procedure Laterality Date  . IUD REMOVAL  N/A 05/08/2017   Procedure: INTRAUTERINE DEVICE (IUD) REMOVAL;  Surgeon: 07/08/2017, MD;  Location: [facility] ORS;  Service: Gynecology;  Laterality: N/A;  . LAPAROSCOPIC LYSIS OF ADHESIONS  05/08/2017   Procedure: LAPAROSCOPIC LYSIS OF ADHESIONS;  Surgeon: 07/08/2017, MD;  Location: [facility] ORS;  Service: Gynecology;;  . LAPAROSCOPY N/A 05/08/2017   Procedure: LAPAROSCOPY OPERATIVE;  Surgeon: 07/08/2017, MD;  Location: [facility] ORS;  Service: Gynecology;  Laterality: N/A;  . THUMB SURGERY Left AGE 5  . WISDOM TOOTH EXTRACTION      Current Outpatient Medications on File Prior to Visit  Medication Sig Dispense Refill  . ARIPiprazole (ABILIFY) 2 MG tablet Take 1 tablet (2 mg total) by mouth daily. 90 tablet 1  . baclofen (LIORESAL) 10 MG tablet Take 1 tablet (10 mg total) by mouth 2 (two) times daily as needed. (Patient taking differently: Take 10 mg by mouth 2 (two) times daily as needed for muscle spasms. ) 60 each 3  . CAMRESE 0.15-0.03 &0.01 MG tablet Take 1 tablet by mouth at bedtime. 28 tablet 0  . cetirizine (ZYRTEC) 10 MG tablet Take 10 mg by mouth at bedtime.    . cyanocobalamin (,VITAMIN B-12,) 1000 MCG/ML injection Inject 1 mL (1,000 mcg total) into the muscle every 30 (thirty) days. 10 mL 1  . DULoxetine (CYMBALTA) 60 MG capsule Take  1 capsule (60 mg total) by mouth daily. 90 capsule 1  . Erenumab-aooe (AIMOVIG) 70 MG/ML SOAJ Inject 70 mg into the skin every 30 (thirty) days. 1 mL 5  . Multiple Vitamin (MULTI-VITAMINS) TABS Take 1 tablet by mouth daily.     . naproxen (NAPROSYN) 500 MG tablet Take 1 tablet (500 mg total) by mouth 2 (two) times daily as needed for headache. 60 tablet 2  . pantoprazole (PROTONIX) 40 MG tablet Take 1 tablet (40 mg total) by mouth daily. 90 tablet 1  . promethazine (PHENERGAN) 25 MG tablet Take 1 tablet (25 mg total) by mouth every 8 (eight) hours as needed for nausea or vomiting. 20 tablet 0  . rizatriptan (MAXALT) 10 MG tablet Take 1 tablet (10 mg total) by  mouth as needed for migraine. May repeat in 2 hours if needed 10 tablet 2  . topiramate (TOPAMAX) 200 MG tablet Take 1 tablet (200 mg total) by mouth daily. 90 tablet 1   No current facility-administered medications on file prior to visit.    Allergies  Allergen Reactions  . Vantin [Cefpodoxime] Rash    Shortness of breath    Social History:  reports that she has quit smoking. She has never used smokeless tobacco. She reports current alcohol use. She reports that she does not use drugs.  Family History  Adopted: Yes    The following portions of the patient's history were reviewed and updated as appropriate: allergies, current medications, past family history, past medical history, past social history, past surgical history and problem list.  Review of Systems Pertinent items noted in HPI and remainder of comprehensive ROS otherwise negative.  Physical Exam:  BP 100/63   Pulse 94   Ht 5\' 2"  (1.575 m)   Wt 192 lb 4 oz (87.2 kg)   BMI 35.16 kg/m  CONSTITUTIONAL: Well-developed, well-nourished, over weight,female in no acute distress.  HENT:  Normocephalic, atraumatic, External right and left ear normal. Oropharynx is clear and moist EYES: Conjunctivae and EOM are normal. Pupils are equal, round, and reactive to light. No scleral icterus.  NECK: Normal range of motion, supple, no masses.  Normal thyroid.  SKIN: Skin is warm and dry. No rash noted. Not diaphoretic. No erythema. No pallor. MUSCULOSKELETAL: Normal range of motion. No tenderness.  No cyanosis, clubbing, or edema.  2+ distal pulses. NEUROLOGIC: Alert and oriented to person, place, and time. Normal reflexes, muscle tone coordination.  PSYCHIATRIC: Normal mood and affect. Normal behavior. Normal judgment and thought content. CARDIOVASCULAR: Normal heart rate noted, regular rhythm RESPIRATORY: Clear to auscultation bilaterally. Effort and breath sounds normal, no problems with respiration noted. BREASTS: Symmetric in  size. No masses, tenderness, skin changes, nipple drainage, or lymphadenopathy bilaterally.  ABDOMEN: Soft, no distention noted.  No tenderness, rebound or guarding.  PELVIC: Normal appearing external genitalia and urethral meatus; normal appearing vaginal mucosa and cervix.  No abnormal discharge noted.  Pap smear not indicated.  Normal uterine size, no other palpable masses, no uterine or adnexal tenderness.   Assessment and Plan:    1. Women's annual routine gynecological examination  Pap: not due  Mammogram scheduled  Labs: hep C -health maintenance Refills/orders: BC pill Referrals:none Pt does not have any contraindications to use of phentermine. Pt encouraged to make appointment for wt loss inital visit.  Routine preventative health maintenance measures emphasized. Please refer to After Visit Summary for other counseling recommendations.      , CNM

## 2020-02-12 NOTE — Patient Instructions (Signed)

## 2020-02-13 LAB — HEPATITIS C ANTIBODY: Hep C Virus Ab: <0.1 s/co ratio (ref 0.0–0.9)

## 2020-02-21 ENCOUNTER — Ambulatory Visit
Admission: RE | Admit: 2020-02-21 | Discharge: 2020-02-21 | Disposition: A | Discharge status: Home / Self Care | Payer: BC Managed Care – PPO | Source: Ambulatory Visit | Attending: Family Medicine | Admitting: Family Medicine

## 2020-02-21 DIAGNOSIS — Z1231 Encounter for screening mammogram for malignant neoplasm of breast: Secondary | ICD-10-CM

## 2020-02-21 DIAGNOSIS — R928 Other abnormal and inconclusive findings on diagnostic imaging of breast: Secondary | ICD-10-CM | POA: Diagnosis not present

## 2020-02-21 DIAGNOSIS — N63 Unspecified lump in unspecified breast: Secondary | ICD-10-CM

## 2020-02-21 DIAGNOSIS — N6001 Solitary cyst of right breast: Secondary | ICD-10-CM | POA: Diagnosis not present

## 2020-03-03 DIAGNOSIS — M7542 Impingement syndrome of left shoulder: Secondary | ICD-10-CM | POA: Diagnosis not present

## 2020-04-07 DIAGNOSIS — M7542 Impingement syndrome of left shoulder: Secondary | ICD-10-CM | POA: Diagnosis not present

## 2020-04-21 DIAGNOSIS — M25512 Pain in left shoulder: Secondary | ICD-10-CM | POA: Diagnosis not present

## 2020-04-21 DIAGNOSIS — M7542 Impingement syndrome of left shoulder: Secondary | ICD-10-CM | POA: Diagnosis not present

## 2020-05-11 ENCOUNTER — Ambulatory Visit (INDEPENDENT_AMBULATORY_CARE_PROVIDER_SITE_OTHER): Payer: BC Managed Care – PPO | Admitting: Nurse Practitioner

## 2020-05-11 ENCOUNTER — Encounter: Payer: Self-pay | Admitting: Nurse Practitioner

## 2020-05-11 ENCOUNTER — Other Ambulatory Visit: Payer: Self-pay

## 2020-05-11 VITALS — BP 105/72 | HR 86 | Temp 97.3°F | Ht 62.0 in | Wt 197.8 lb

## 2020-05-11 DIAGNOSIS — Z6836 Body mass index (BMI) 36.0-36.9, adult: Secondary | ICD-10-CM | POA: Diagnosis not present

## 2020-05-11 DIAGNOSIS — E6609 Other obesity due to excess calories: Secondary | ICD-10-CM | POA: Diagnosis not present

## 2020-05-11 DIAGNOSIS — E538 Deficiency of other specified B group vitamins: Secondary | ICD-10-CM | POA: Diagnosis not present

## 2020-05-11 MED ORDER — LIRAGLUTIDE 18 MG/3ML ~~LOC~~ SOPN: 0.6000 mg | PEN_INJECTOR | Freq: Every day | SUBCUTANEOUS | 4 refills | Status: DC

## 2020-05-11 NOTE — Assessment & Plan Note (Signed)
History of low levels, not currently taking supplement.  Check level today and restart supplement as needed.

## 2020-05-11 NOTE — Assessment & Plan Note (Addendum)
Working on weight loss, current BMI 36.18.  Discussed that she is on some medications which can cause some weight gain, Abilify & Topamax.  At this time she wishes to continue these medications due to benefit.  Will check labs today to include TSH, Vit D, BMP, and A1C.  Start Victoza for weight loss, no history of thyroid CA in family or personal, script sent. Recommended eating smaller high protein, low fat meals more frequently and exercising 30 mins a day 5 times a week with a goal of 10-15lb weight loss in the next 3 months. Patient voiced their understanding and motivation to adhere to these recommendations. Return in 6 weeks.

## 2020-05-11 NOTE — Progress Notes (Signed)
BP 105/72 (BP Location: Right Arm, Patient Position: Sitting, Cuff Size: Large)   Pulse 86   Temp (!) 97.3 F (36.3 C) (Oral)   Ht 5\' 2"  (1.575 m)   Wt 197 lb 12.8 oz (89.7 kg)   SpO2 99%   BMI 36.18 kg/m    Subjective:    Patient ID: Mia Thompson, female    DOB: August 09, 1975, 44 y.o.   MRN: [id number]  HPI: Mia Thompson is a 44 y.o. female  Chief Complaint  Patient presents with  . Obesity    OBESITY: Presents today for concern about weight gain, has lost weight twice in life.  The lowest weight in life has been 120 lbs.  Currently is eating low calorie meals and walking, walks about 2-3 times a week for 30 minutes.  Eats 3 meals a day, energy bar for breakfast/lunch 300-400 calories ([employer]) + yogurt/dinner chicken/hamburger/beef + vegetables.  Hurt shoulder in August, is unable to lift weights.  Stands all day at work and was having foot issues, which have improved with September.  Does not sleep well, gets hot at night which she has talked to GYN about, plus has had the shoulder injury recently. Wakes up multiple times a night.  Has tried Melatonin for sleep.    In past did try Phentermine without benefit, tried twice.  Also tried an OTC medication in past without benefit.    Relevant past medical, surgical, family and social history reviewed and updated as indicated. Interim medical history since our last visit reviewed. Allergies and medications reviewed and updated.  Review of Systems  Constitutional: Negative for activity change, appetite change, diaphoresis, fatigue and fever.  Respiratory: Negative for cough, chest tightness and shortness of breath.   Cardiovascular: Negative for chest pain, palpitations and leg swelling.  Gastrointestinal: Negative.   Endocrine: Negative for cold intolerance, heat intolerance, polydipsia, polyphagia and polyuria.  Neurological: Negative.   Psychiatric/Behavioral: Negative.     Per HPI unless specifically indicated  above     Objective:    BP 105/72 (BP Location: Right Arm, Patient Position: Sitting, Cuff Size: Large)   Pulse 86   Temp (!) 97.3 F (36.3 C) (Oral)   Ht 5\' 2"  (1.575 m)   Wt 197 lb 12.8 oz (89.7 kg)   SpO2 99%   BMI 36.18 kg/m   Wt Readings from Last 3 Encounters:  05/11/20 197 lb 12.8 oz (89.7 kg)  02/12/20 192 lb 4 oz (87.2 kg)  12/19/19 185 lb (83.9 kg)    Physical Exam Vitals and nursing note reviewed.  Constitutional:      General: She is awake. She is not in acute distress.    Appearance: She is well-developed and well-groomed. She is obese. She is not ill-appearing.  HENT:     Head: Normocephalic.     Right Ear: Hearing normal.     Left Ear: Hearing normal.  Eyes:     General: Lids are normal.        Right eye: No discharge.        Left eye: No discharge.     Conjunctiva/sclera: Conjunctivae normal.     Pupils: Pupils are equal, round, and reactive to light.  Cardiovascular:     Rate and Rhythm: Normal rate and regular rhythm.     Heart sounds: Normal heart sounds. No murmur heard.  No gallop.   Pulmonary:     Effort: Pulmonary effort is normal. No accessory muscle usage or respiratory distress.  Breath sounds: Normal breath sounds.  Abdominal:     General: Bowel sounds are normal.     Palpations: Abdomen is soft.  Musculoskeletal:     Cervical back: Normal range of motion and neck supple.     Right lower leg: No edema.     Left lower leg: No edema.  Skin:    General: Skin is warm and dry.  Neurological:     Mental Status: She is alert and oriented to person, place, and time.  Psychiatric:        Attention and Perception: Attention normal.        Mood and Affect: Mood normal.        Speech: Speech normal.        Behavior: Behavior normal. Behavior is cooperative.        Thought Content: Thought content normal.     Results for orders placed or performed in visit on 02/12/20  Hepatitis C Antibody  Result Value Ref Range   Hep C Virus Ab <0.1  0.0 - 0.9 s/co ratio      Assessment & Plan:   Problem List Items Addressed This Visit      Other   B12 deficiency    History of low levels, not currently taking supplement.  Check level today and restart supplement as needed.      Relevant Orders   Vitamin B12   Class 2 obesity due to excess calories without serious comorbidity with body mass index (BMI) of 36.0 to 36.9 in adult - Primary    Working on weight loss, current BMI 36.18.  Discussed that she is on some medications which can cause some weight gain, Abilify & Topamax.  At this time she wishes to continue these medications due to benefit.  Will check labs today to include TSH, Vit D, BMP, and A1C.  Start Victoza for weight loss, no history of thyroid CA in family or personal, script sent. Recommended eating smaller high protein, low fat meals more frequently and exercising 30 mins a day 5 times a week with a goal of 10-15lb weight loss in the next 3 months. Patient voiced their understanding and motivation to adhere to these recommendations. Return in 6 weeks.       Relevant Medications   liraglutide (VICTOZA) 18 MG/3ML SOPN   Other Relevant Orders   TSH   VITAMIN D 25 Hydroxy (Vit-D Deficiency, Fractures)   Basic metabolic panel   HgB A1c       Follow up plan: Return in about 6 weeks (around 06/22/2020) for Obesity.

## 2020-05-11 NOTE — Patient Instructions (Signed)

## 2020-05-12 LAB — BASIC METABOLIC PANEL
BUN/Creatinine Ratio: 15 (ref 9–23)
BUN: 15 mg/dL (ref 6–24)
CO2: 21 mmol/L (ref 20–29)
Calcium: 9.3 mg/dL (ref 8.7–10.2)
Chloride: 108 mmol/L — ABNORMAL HIGH (ref 96–106)
Creatinine, Ser: 1 mg/dL (ref 0.57–1.00)
GFR calc Af Amer: 80 mL/min/{1.73_m2} (ref >59)
GFR calc non Af Amer: 69 mL/min/{1.73_m2} (ref >59)
Glucose: 94 mg/dL (ref 65–99)
Potassium: 5 mmol/L (ref 3.5–5.2)
Sodium: 140 mmol/L (ref 134–144)

## 2020-05-12 LAB — HEMOGLOBIN A1C
Est. average glucose Bld gHb Est-mCnc: 100 mg/dL
Hgb A1c MFr Bld: 5.1 % (ref 4.8–5.6)

## 2020-05-12 LAB — TSH: TSH: 2.29 u[IU]/mL (ref 0.450–4.500)

## 2020-05-12 LAB — VITAMIN D 25 HYDROXY (VIT D DEFICIENCY, FRACTURES): Vit D, 25-Hydroxy: 35.5 ng/mL (ref 30.0–100.0)

## 2020-05-12 LAB — VITAMIN B12: Vitamin B-12: 228 pg/mL — ABNORMAL LOW (ref 232–1245)

## 2020-05-12 NOTE — Progress Notes (Signed)
Contacted via MyChart  Good morning Mia Thompson, your labs have returned.  Thyroid is normal.  A1C shows no diabetes.  Kidney function and electrolytes look good.  B12 level is on low side, I recommend you start taking Vitamin B12 1000 MCG daily.  This is good for energy level and nervous system health.  You can obtain in vitamin section.  We will recheck this next visit.  Vitamin D is on low side of normal, you may also benefit from taking Vitamin D3 1000 units daily which is good for bone and muscle health.  Any questions? Keep being awesome!!  Thank you for allowing me to participate in your care. Kindest regards, Kaedyn Belardo

## 2020-05-13 ENCOUNTER — Other Ambulatory Visit: Payer: Self-pay | Admitting: Nurse Practitioner

## 2020-05-13 ENCOUNTER — Telehealth: Payer: Self-pay

## 2020-05-13 NOTE — Progress Notes (Signed)
Error

## 2020-05-13 NOTE — Telephone Encounter (Signed)
Pt called and stated that because she doesn't think the prior auth will be approved for victoza she would like to know if she can change to saxenda.

## 2020-05-13 NOTE — Telephone Encounter (Signed)
Let me know when [employer] responds and if not approved we can try Saxenda.

## 2020-05-15 ENCOUNTER — Other Ambulatory Visit: Payer: Self-pay | Admitting: Nurse Practitioner

## 2020-05-15 MED ORDER — SAXENDA 18 MG/3ML ~~LOC~~ SOPN: 0.6000 mg | PEN_INJECTOR | Freq: Every day | SUBCUTANEOUS | 2 refills | Status: DC

## 2020-05-15 NOTE — Telephone Encounter (Signed)
Sent in [place] per patient request, will trial this.  Please alert here Victoza was denied and have tried sending in Saxenda.

## 2020-05-15 NOTE — Telephone Encounter (Signed)
PA denied.

## 2020-05-15 NOTE — Telephone Encounter (Signed)
Spoke with pt and she stated that this medication needs prior auth as well.

## 2020-05-20 ENCOUNTER — Telehealth: Payer: Self-pay

## 2020-05-20 NOTE — Telephone Encounter (Signed)
PA for Saxenda initiated and submitted via Cover My Meds. Key: [id number]

## 2020-05-20 NOTE — Telephone Encounter (Signed)
PA done today. 

## 2020-05-29 NOTE — Telephone Encounter (Signed)
Patient is aware, alerted her via MyChart.  She wishes to continue diet focus at this time.

## 2020-05-29 NOTE — Telephone Encounter (Signed)
PA for Victoza denied. Please advise.

## 2020-06-24 ENCOUNTER — Other Ambulatory Visit: Payer: Self-pay | Admitting: Nurse Practitioner

## 2020-06-24 ENCOUNTER — Ambulatory Visit: Payer: BC Managed Care – PPO | Admitting: Family Medicine

## 2020-06-24 ENCOUNTER — Ambulatory Visit: Payer: BC Managed Care – PPO | Admitting: Nurse Practitioner

## 2020-06-24 NOTE — Telephone Encounter (Signed)
Requested medication (s) are due for refill today: yes  Requested medication (s) are on the active medication list: yes  Last refill:  08/22/19 #90 1 refill   Future visit scheduled: no   Notes to clinic:  last seen by Harvest Dark, DNP. PCP listed as R. Coker, [place]. Refill Rx?     Requested Prescriptions  Pending Prescriptions Disp Refills   pantoprazole (PROTONIX) 40 MG tablet [Pharmacy Med Name: PANTOPRAZOLE SOD DR 40 MG TAB] 90 tablet 0    Sig: Take 1 tablet (40 mg total) by mouth daily.      Gastroenterology: Proton Pump Inhibitors Passed - 06/24/2020  2:54 PM      Passed - Valid encounter within last 12 months    Recent Outpatient Visits           1 month ago Class 2 obesity due to excess calories without serious comorbidity with body mass index (BMI) of 36.0 to 36.9 in adult   [facility], [place] T, NP   6 months ago Migraine without status migrainosus, not intractable, unspecified migraine type   [facility] Roosvelt Maser [place], [place]   9 months ago Moderate episode of recurrent major depressive disorder [facility])   [facility] Roosvelt Maser [place], [place]   10 months ago Chest pain, unspecified type   [facility], [name], [place]   1 year ago Urinary frequency   [facility] Marjie Skiff, NP       Future Appointments             In 7 months Doreene Burke, CNM [facility]

## 2020-07-10 ENCOUNTER — Other Ambulatory Visit: Payer: Self-pay | Admitting: Family Medicine

## 2020-07-10 NOTE — Telephone Encounter (Signed)
Requested medication (s) are due for refill today:  Yes  Requested medication (s) are on the active medication list:  Yes  Future visit scheduled:  No  Last Refill: 12/19/19; #90; RF x 1  Notes to clinic: medication is not delegated.  Requested Prescriptions  Pending Prescriptions Disp Refills   ARIPiprazole (ABILIFY) 2 MG tablet [Pharmacy Med Name: ARIPIPRAZOLE 2 MG TABLET] 90 tablet 0    Sig: Take 1 tablet (2 mg total) by mouth daily.      Not Delegated - Psychiatry:  Antipsychotics - Second Generation (Atypical) - aripiprazole Failed - 07/10/2020  3:54 PM      Failed - This refill cannot be delegated      Passed - Valid encounter within last 6 months    Recent Outpatient Visits           2 months ago Class 2 obesity due to excess calories without serious comorbidity with body mass index (BMI) of 36.0 to 36.9 in adult   [facility], [place] T, NP   6 months ago Migraine without status migrainosus, not intractable, unspecified migraine type   [facility] [name], [place]   9 months ago Moderate episode of recurrent major depressive disorder [facility])   [facility] Roosvelt Maser [place], [place]   10 months ago Chest pain, unspecified type   [facility], [name], [place]   1 year ago Urinary frequency   [facility] Marjie Skiff, NP       Future Appointments             In 7 months Doreene Burke, CNM [facility]

## 2020-07-10 NOTE — Telephone Encounter (Signed)
Routing to provider  

## 2020-07-14 ENCOUNTER — Other Ambulatory Visit: Payer: Self-pay | Admitting: Family Medicine

## 2020-07-14 NOTE — Telephone Encounter (Signed)
Routing to provider  

## 2020-07-14 NOTE — Telephone Encounter (Signed)
Requested medication (s) are due for refill today: yes  Requested medication (s) are on the active medication list: yes  Last refill:  06/12/2020  Future visit scheduled: no  Notes to clinic:  medication not assigned to a protocol, review manually    Requested Prescriptions  Pending Prescriptions Disp Refills   AIMOVIG 70 MG/ML SOAJ [Pharmacy Med Name: AIMOVIG 70 MG/ML AUTOINJECTOR] 1 mL 0    Sig: Inject 70 mg into the skin every 30 (thirty) days.      Off-Protocol Failed - 07/14/2020  9:12 AM      Failed - Medication not assigned to a protocol, review manually.      Passed - Valid encounter within last 12 months    Recent Outpatient Visits           2 months ago Class 2 obesity due to excess calories without serious comorbidity with body mass index (BMI) of 36.0 to 36.9 in adult   [facility], [place] T, NP   6 months ago Migraine without status migrainosus, not intractable, unspecified migraine type   [facility], [place], [place]   10 months ago Moderate episode of recurrent major depressive disorder [facility])   [facility] Roosvelt Maser [place], [place]   10 months ago Chest pain, unspecified type   [facility], [name], [place]   1 year ago Urinary frequency   [facility] Marjie Skiff, NP       Future Appointments             In 7 months Doreene Burke, CNM [facility]           Off-Protocol Failed - 07/14/2020  9:12 AM      Failed - Medication not assigned to a protocol, review manually.      Passed - Valid encounter within last 12 months    Recent Outpatient Visits           2 months ago Class 2 obesity due to excess calories without serious comorbidity with body mass index (BMI) of 36.0 to 36.9 in adult   [facility], [place] T, NP   6 months ago Migraine without status migrainosus, not intractable, unspecified migraine type    [facility], [place], [place]   10 months ago Moderate episode of recurrent major depressive disorder [facility])   [facility] Roosvelt Maser [place], [place]   10 months ago Chest pain, unspecified type   [facility] [name], [place]   1 year ago Urinary frequency   [facility] Marjie Skiff, NP       Future Appointments             In 7 months Doreene Burke, CNM [facility]

## 2020-07-15 NOTE — Telephone Encounter (Signed)
Please get patient scheduled.  °

## 2020-07-15 NOTE — Telephone Encounter (Signed)
Needs appt

## 2020-07-15 NOTE — Telephone Encounter (Signed)
Scheduled 1/5

## 2020-08-12 ENCOUNTER — Other Ambulatory Visit: Payer: Self-pay

## 2020-08-12 ENCOUNTER — Encounter: Payer: Self-pay | Admitting: Nurse Practitioner

## 2020-08-12 ENCOUNTER — Ambulatory Visit: Payer: BC Managed Care – PPO | Admitting: Nurse Practitioner

## 2020-08-12 VITALS — BP 92/64 | HR 73 | Temp 97.7°F | Ht 62.24 in | Wt 203.8 lb

## 2020-08-12 DIAGNOSIS — Z6836 Body mass index (BMI) 36.0-36.9, adult: Secondary | ICD-10-CM | POA: Diagnosis not present

## 2020-08-12 DIAGNOSIS — E6609 Other obesity due to excess calories: Secondary | ICD-10-CM

## 2020-08-12 MED ORDER — AIMOVIG 70 MG/ML ~~LOC~~ SOAJ: 70.0000 mg | SUBCUTANEOUS | 4 refills | Status: DC

## 2020-08-12 MED ORDER — ARIPIPRAZOLE 2 MG PO TABS: 2.0000 mg | ORAL_TABLET | Freq: Every day | ORAL | 4 refills | Status: DC

## 2020-08-12 MED ORDER — TOPIRAMATE 200 MG PO TABS: 200.0000 mg | ORAL_TABLET | Freq: Every day | ORAL | 4 refills | Status: DC

## 2020-08-12 MED ORDER — DULOXETINE HCL 60 MG PO CPEP: ORAL_CAPSULE | ORAL | 4 refills | Status: DC

## 2020-08-12 NOTE — Progress Notes (Signed)
BP 92/64   Pulse 73   Temp 97.7 F (36.5 C) (Oral)   Ht 5' 2.24" (1.581 m)   Wt 203 lb 12.8 oz (92.4 kg)   SpO2 96%   BMI 36.98 kg/m    Subjective:    Patient ID: Mia Thompson, female    DOB: 1975/12/25, 45 y.o.   MRN: [id number]  HPI: Mia Thompson is a 45 y.o. female  Chief Complaint  Patient presents with  . Obesity   OBESITY: Follow-up for concern about weight gain, has lost weight twice in life -- currently has gained weight due to holidays.  The lowest weight in life has been 120 lbs.  Currently is eating low calorie meals (except for during holidays) and walking, walks about 2-3 times a week for 30 minutes.  Eats 3 meals a day, energy bar for breakfast/lunch 300-400 calories ([employer]) + yogurt/dinner chicken/hamburger/beef + vegetables.  Hurt shoulder in August, is unable to lift weights.  Stands all day at work and was having foot issues, which have improved with [employer].  Does not sleep well, gets hot at night which she has talked to GYN about, plus has had the shoulder injury recently. Wakes up multiple times a night.  Has tried Melatonin for sleep.  Attempted to get Saxenda covered last visit, but this was denied.  In past did try Phentermine without benefit, tried twice.  Also tried an OTC medication in past without benefit.     Relevant past medical, surgical, family and social history reviewed and updated as indicated. Interim medical history since our last visit reviewed. Allergies and medications reviewed and updated.  Review of Systems  Constitutional: Negative for activity change, appetite change, diaphoresis, fatigue and fever.  Respiratory: Negative for cough, chest tightness and shortness of breath.   Cardiovascular: Negative for chest pain, palpitations and leg swelling.  Gastrointestinal: Negative.   Endocrine: Negative for cold intolerance, heat intolerance, polydipsia, polyphagia and polyuria.  Neurological: Negative.    Psychiatric/Behavioral: Negative.     Per HPI unless specifically indicated above     Objective:    BP 92/64   Pulse 73   Temp 97.7 F (36.5 C) (Oral)   Ht 5' 2.24" (1.581 m)   Wt 203 lb 12.8 oz (92.4 kg)   SpO2 96%   BMI 36.98 kg/m   Wt Readings from Last 3 Encounters:  08/12/20 203 lb 12.8 oz (92.4 kg)  05/11/20 197 lb 12.8 oz (89.7 kg)  02/12/20 192 lb 4 oz (87.2 kg)    Physical Exam Vitals and nursing note reviewed.  Constitutional:      General: She is awake. She is not in acute distress.    Appearance: She is well-developed and well-groomed. She is obese. She is not ill-appearing.  HENT:     Head: Normocephalic.     Right Ear: Hearing normal.     Left Ear: Hearing normal.  Eyes:     General: Lids are normal.        Right eye: No discharge.        Left eye: No discharge.     Conjunctiva/sclera: Conjunctivae normal.     Pupils: Pupils are equal, round, and reactive to light.  Cardiovascular:     Rate and Rhythm: Normal rate and regular rhythm.     Heart sounds: Normal heart sounds. No murmur heard. No gallop.   Pulmonary:     Effort: Pulmonary effort is normal. No accessory muscle usage or respiratory distress.  Breath sounds: Normal breath sounds.  Abdominal:     General: Bowel sounds are normal.     Palpations: Abdomen is soft.  Musculoskeletal:     Cervical back: Normal range of motion and neck supple.     Right lower leg: No edema.     Left lower leg: No edema.  Skin:    General: Skin is warm and dry.  Neurological:     Mental Status: She is alert and oriented to person, place, and time.  Psychiatric:        Attention and Perception: Attention normal.        Mood and Affect: Mood normal.        Speech: Speech normal.        Behavior: Behavior normal. Behavior is cooperative.        Thought Content: Thought content normal.     Results for orders placed or performed in visit on 05/11/20  TSH  Result Value Ref Range   TSH 2.290 0.450 -  4.500 uIU/mL  VITAMIN D 25 Hydroxy (Vit-D Deficiency, Fractures)  Result Value Ref Range   Vit D, 25-Hydroxy 35.5 30.0 - 100.0 ng/mL  Vitamin B12  Result Value Ref Range   Vitamin B-12 228 (L) 232 - 1,245 pg/mL  Basic metabolic panel  Result Value Ref Range   Glucose 94 65 - 99 mg/dL   BUN 15 6 - 24 mg/dL   Creatinine, Ser 3.47 0.57 - 1.00 mg/dL   GFR calc non Af Amer 69 >59 mL/min/1.73   GFR calc Af Amer 80 >59 mL/min/1.73   BUN/Creatinine Ratio 15 9 - 23   Sodium 140 134 - 144 mmol/L   Potassium 5.0 3.5 - 5.2 mmol/L   Chloride 108 (H) 96 - 106 mmol/L   CO2 21 20 - 29 mmol/L   Calcium 9.3 8.7 - 10.2 mg/dL  HgB Q2V  Result Value Ref Range   Hgb A1c MFr Bld 5.1 4.8 - 5.6 %   Est. average glucose Bld gHb Est-mCnc 100 mg/dL      Assessment & Plan:   Problem List Items Addressed This Visit      Other   Class 2 obesity due to excess calories without serious comorbidity with body mass index (BMI) of 36.0 to 36.9 in adult - Primary    Working on weight loss, current BMI 36.98.  Discussed that she is on some medications which can cause some weight gain, Abilify.  At this time she wishes to continue this medication due to benefit.  Recommended eating smaller high protein, low fat meals more frequently and exercising 30 mins a day 5 times a week with a goal of 10-15lb weight loss in the next 3 months. Patient voiced their understanding and motivation to adhere to these recommendations.  Will place referral to weight management in [facility] for further recommendations and guidance.  Return in 6 months to meet new PCP.       Relevant Orders   Amb Ref to Medical Weight Management       Follow up plan: Return in about 6 months (around 02/09/2021) for MOOD, MIGRAINES, WEIGHT -- to meet new PCP.

## 2020-08-12 NOTE — Patient Instructions (Signed)
Healthy Eating Following a healthy eating pattern may help you to achieve and maintain a healthy body weight, reduce the risk of chronic disease, and live a long and productive life. It is important to follow a healthy eating pattern at an appropriate calorie level for your body. Your nutritional needs should be met primarily through food by choosing a variety of nutrient-rich foods. What are tips for following this plan? Reading food labels  Read labels and choose the following: ? Reduced or low sodium. ? Juices with 100% fruit juice. ? Foods with low saturated fats and high polyunsaturated and monounsaturated fats. ? Foods with whole grains, such as whole wheat, cracked wheat, brown rice, and wild rice. ? Whole grains that are fortified with folic acid. This is recommended for women who are pregnant or who want to become pregnant.  Read labels and avoid the following: ? Foods with a lot of added sugars. These include foods that contain brown sugar, corn sweetener, corn syrup, dextrose, fructose, glucose, high-fructose corn syrup, honey, invert sugar, lactose, malt syrup, maltose, molasses, raw sugar, sucrose, trehalose, or turbinado sugar.  Do not eat more than the following amounts of added sugar per day:  6 teaspoons (25 g) for women.  9 teaspoons (38 g) for men. ? Foods that contain processed or refined starches and grains. ? Refined grain products, such as white flour, degermed cornmeal, white bread, and white rice. Shopping  Choose nutrient-rich snacks, such as vegetables, whole fruits, and nuts. Avoid high-calorie and high-sugar snacks, such as potato chips, fruit snacks, and candy.  Use oil-based dressings and spreads on foods instead of solid fats such as butter, stick margarine, or cream cheese.  Limit pre-made sauces, mixes, and "instant" products such as flavored rice, instant noodles, and ready-made pasta.  Try more plant-protein sources, such as tofu, tempeh, black beans,  edamame, lentils, nuts, and seeds.  Explore eating plans such as the Mediterranean diet or vegetarian diet. Cooking  Use oil to saut or stir-fry foods instead of solid fats such as butter, stick margarine, or lard.  Try baking, boiling, grilling, or broiling instead of frying.  Remove the fatty part of meats before cooking.  Steam vegetables in water or broth. Meal planning   At meals, imagine dividing your plate into fourths: ? One-half of your plate is fruits and vegetables. ? One-fourth of your plate is whole grains. ? One-fourth of your plate is protein, especially lean meats, poultry, eggs, tofu, beans, or nuts.  Include low-fat dairy as part of your daily diet. Lifestyle  Choose healthy options in all settings, including home, work, school, restaurants, or stores.  Prepare your food safely: ? Wash your hands after handling raw meats. ? Keep food preparation surfaces clean by regularly washing with hot, soapy water. ? Keep raw meats separate from ready-to-eat foods, such as fruits and vegetables. ? Cook seafood, meat, poultry, and eggs to the recommended internal temperature. ? Store foods at safe temperatures. In general:  Keep cold foods at 59F (4.4C) or below.  Keep hot foods at 159F (60C) or above.  Keep your freezer at [facility] (-17.8C) or below.  Foods are no longer safe to eat when they have been between the temperatures of 40-159F (4.4-60C) for more than 2 hours. What foods should I eat? Fruits Aim to eat 2 cup-equivalents of fresh, canned (in natural juice), or frozen fruits each day. Examples of 1 cup-equivalent of fruit include 1 small apple, 8 large strawberries, 1 cup canned fruit,  cup  dried fruit, or 1 cup 100% juice. Vegetables Aim to eat 2-3 cup-equivalents of fresh and frozen vegetables each day, including different varieties and colors. Examples of 1 cup-equivalent of vegetables include 2 medium carrots, 2 cups raw, leafy greens, 1 cup chopped  vegetable (raw or cooked), or 1 medium baked potato. Grains Aim to eat 6 ounce-equivalents of whole grains each day. Examples of 1 ounce-equivalent of grains include 1 slice of bread, 1 cup ready-to-eat cereal, 3 cups popcorn, or  cup cooked rice, pasta, or cereal. Meats and other proteins Aim to eat 5-6 ounce-equivalents of protein each day. Examples of 1 ounce-equivalent of protein include 1 egg, 1/2 cup nuts or seeds, or 1 tablespoon (16 g) peanut butter. A cut of meat or fish that is the size of a deck of cards is about 3-4 ounce-equivalents.  Of the protein you eat each week, try to have at least 8 ounces come from seafood. This includes salmon, trout, herring, and anchovies. Dairy Aim to eat 3 cup-equivalents of fat-free or low-fat dairy each day. Examples of 1 cup-equivalent of dairy include 1 cup (240 mL) milk, 8 ounces (250 g) yogurt, 1 ounces (44 g) natural cheese, or 1 cup (240 mL) fortified soy milk. Fats and oils  Aim for about 5 teaspoons (21 g) per day. Choose monounsaturated fats, such as canola and olive oils, avocados, peanut butter, and most nuts, or polyunsaturated fats, such as sunflower, corn, and soybean oils, walnuts, pine nuts, sesame seeds, sunflower seeds, and flaxseed. Beverages  Aim for six 8-oz glasses of water per day. Limit coffee to three to five 8-oz cups per day.  Limit caffeinated beverages that have added calories, such as soda and energy drinks.  Limit alcohol intake to no more than 1 drink a day for nonpregnant women and 2 drinks a day for men. One drink equals 12 oz of beer (355 mL), 5 oz of wine (148 mL), or 1 oz of hard liquor (44 mL). Seasoning and other foods  Avoid adding excess amounts of salt to your foods. Try flavoring foods with herbs and spices instead of salt.  Avoid adding sugar to foods.  Try using oil-based dressings, sauces, and spreads instead of solid fats. This information is based on general [place] nutrition guidelines. For more  information, visit [employer].es. Exact amounts may vary based on your nutrition needs. Summary  A healthy eating plan may help you to maintain a healthy weight, reduce the risk of chronic diseases, and stay active throughout your life.  Plan your meals. Make sure you eat the right portions of a variety of nutrient-rich foods.  Try baking, boiling, grilling, or broiling instead of frying.  Choose healthy options in all settings, including home, work, school, restaurants, or stores. This information is not intended to replace advice given to you by your health care provider. Make sure you discuss any questions you have with your health care provider. Document Revised: 11/06/2017 Document Reviewed: 11/06/2017 Elsevier Patient Education  [place].

## 2020-08-12 NOTE — Assessment & Plan Note (Signed)
Working on weight loss, current BMI 36.98.  Discussed that she is on some medications which can cause some weight gain, Abilify.  At this time she wishes to continue this medication due to benefit.  Recommended eating smaller high protein, low fat meals more frequently and exercising 30 mins a day 5 times a week with a goal of 10-15lb weight loss in the next 3 months. Patient voiced their understanding and motivation to adhere to these recommendations.  Will place referral to weight management in [facility] for further recommendations and guidance.  Return in 6 months to meet new PCP.

## 2020-10-29 ENCOUNTER — Telehealth: Payer: Self-pay

## 2020-10-29 NOTE — Telephone Encounter (Signed)
PA submitted via cover my meds for Aimovig. Awaiting approval or denial. Key: [id number]

## 2020-11-04 ENCOUNTER — Other Ambulatory Visit: Payer: Self-pay

## 2020-11-04 ENCOUNTER — Ambulatory Visit (INDEPENDENT_AMBULATORY_CARE_PROVIDER_SITE_OTHER): Payer: BC Managed Care – PPO | Admitting: Family Medicine

## 2020-11-04 ENCOUNTER — Encounter (INDEPENDENT_AMBULATORY_CARE_PROVIDER_SITE_OTHER): Payer: Self-pay | Admitting: Family Medicine

## 2020-11-04 VITALS — BP 107/70 | HR 84 | Temp 97.5°F | Ht 62.0 in | Wt 204.0 lb

## 2020-11-04 DIAGNOSIS — Z9189 Other specified personal risk factors, not elsewhere classified: Secondary | ICD-10-CM

## 2020-11-04 DIAGNOSIS — R0602 Shortness of breath: Secondary | ICD-10-CM | POA: Diagnosis not present

## 2020-11-04 DIAGNOSIS — E559 Vitamin D deficiency, unspecified: Secondary | ICD-10-CM | POA: Diagnosis not present

## 2020-11-04 DIAGNOSIS — F3289 Other specified depressive episodes: Secondary | ICD-10-CM | POA: Diagnosis not present

## 2020-11-04 DIAGNOSIS — Z0289 Encounter for other administrative examinations: Secondary | ICD-10-CM

## 2020-11-04 DIAGNOSIS — Z6837 Body mass index (BMI) 37.0-37.9, adult: Secondary | ICD-10-CM

## 2020-11-04 DIAGNOSIS — R6889 Other general symptoms and signs: Secondary | ICD-10-CM | POA: Diagnosis not present

## 2020-11-04 DIAGNOSIS — Z1331 Encounter for screening for depression: Secondary | ICD-10-CM

## 2020-11-04 DIAGNOSIS — E538 Deficiency of other specified B group vitamins: Secondary | ICD-10-CM

## 2020-11-04 DIAGNOSIS — R5383 Other fatigue: Secondary | ICD-10-CM | POA: Diagnosis not present

## 2020-11-05 LAB — HEMOGLOBIN A1C
Est. average glucose Bld gHb Est-mCnc: 103 mg/dL
Hgb A1c MFr Bld: 5.2 % (ref 4.8–5.6)

## 2020-11-05 LAB — CBC WITH DIFFERENTIAL/PLATELET
Basophils Absolute: 0.1 10*3/uL (ref 0.0–0.2)
Basos: 1 %
EOS (ABSOLUTE): 0.3 10*3/uL (ref 0.0–0.4)
Eos: 4 %
Hematocrit: 43.5 % (ref 34.0–46.6)
Hemoglobin: 15.1 g/dL (ref 11.1–15.9)
Immature Grans (Abs): 0 10*3/uL (ref 0.0–0.1)
Immature Granulocytes: 0 %
Lymphocytes Absolute: 1.7 10*3/uL (ref 0.7–3.1)
Lymphs: 23 %
MCH: 31.4 pg (ref 26.6–33.0)
MCHC: 34.7 g/dL (ref 31.5–35.7)
MCV: 90 fL (ref 79–97)
Monocytes Absolute: 0.5 10*3/uL (ref 0.1–0.9)
Monocytes: 6 %
Neutrophils Absolute: 5 10*3/uL (ref 1.4–7.0)
Neutrophils: 66 %
Platelets: 311 10*3/uL (ref 150–450)
RBC: 4.81 x10E6/uL (ref 3.77–5.28)
RDW: 12.3 % (ref 11.7–15.4)
WBC: 7.6 10*3/uL (ref 3.4–10.8)

## 2020-11-05 LAB — COMPREHENSIVE METABOLIC PANEL
ALT: 12 IU/L (ref 0–32)
AST: 15 IU/L (ref 0–40)
Albumin/Globulin Ratio: 2.1 (ref 1.2–2.2)
Albumin: 4.4 g/dL (ref 3.8–4.8)
Alkaline Phosphatase: 57 IU/L (ref 44–121)
BUN/Creatinine Ratio: 14 (ref 9–23)
BUN: 15 mg/dL (ref 6–24)
Bilirubin Total: 0.4 mg/dL (ref 0.0–1.2)
CO2: 17 mmol/L — ABNORMAL LOW (ref 20–29)
Calcium: 9.5 mg/dL (ref 8.7–10.2)
Chloride: 109 mmol/L — ABNORMAL HIGH (ref 96–106)
Creatinine, Ser: 1.05 mg/dL — ABNORMAL HIGH (ref 0.57–1.00)
Globulin, Total: 2.1 g/dL (ref 1.5–4.5)
Glucose: 81 mg/dL (ref 65–99)
Potassium: 5 mmol/L (ref 3.5–5.2)
Sodium: 144 mmol/L (ref 134–144)
Total Protein: 6.5 g/dL (ref 6.0–8.5)
eGFR: 68 mL/min/{1.73_m2} (ref >59)

## 2020-11-05 LAB — LIPID PANEL WITH LDL/HDL RATIO
Cholesterol, Total: 165 mg/dL (ref 100–199)
HDL: 45 mg/dL (ref >39)
LDL Chol Calc (NIH): 109 mg/dL — ABNORMAL HIGH (ref 0–99)
LDL/HDL Ratio: 2.4 ratio (ref 0.0–3.2)
Triglycerides: 57 mg/dL (ref 0–149)
VLDL Cholesterol Cal: 11 mg/dL (ref 5–40)

## 2020-11-05 LAB — T3: T3, Total: 168 ng/dL (ref 71–180)

## 2020-11-05 LAB — FOLATE: Folate: 17.9 ng/mL (ref >3.0)

## 2020-11-05 LAB — VITAMIN B12: Vitamin B-12: >2000 pg/mL — ABNORMAL HIGH (ref 232–1245)

## 2020-11-05 LAB — VITAMIN D 25 HYDROXY (VIT D DEFICIENCY, FRACTURES): Vit D, 25-Hydroxy: 37.7 ng/mL (ref 30.0–100.0)

## 2020-11-05 LAB — T4: T4, Total: 10 ug/dL (ref 4.5–12.0)

## 2020-11-05 LAB — TSH: TSH: 2.17 u[IU]/mL (ref 0.450–4.500)

## 2020-11-05 LAB — INSULIN, RANDOM: INSULIN: 12.8 u[IU]/mL (ref 2.6–24.9)

## 2020-11-05 NOTE — Telephone Encounter (Signed)
[phone]  Lynden Ang called from [facility] needing clarification regarding request

## 2020-11-06 NOTE — Telephone Encounter (Signed)
Information provided to insurance.

## 2020-11-11 NOTE — Progress Notes (Signed)
Chief Complaint:   OBESITY Mia Thompson (MR# [id number]) is a 45 y.o. female who presents for evaluation and treatment of obesity and related comorbidities. Current BMI is Body mass index is 37.31 kg/m. Mia Thompson has been struggling with her weight for many years and has been unsuccessful in either losing weight, maintaining weight loss, or reaching her healthy weight goal.  Mia Thompson is on some medications which can contribute to weight gain.  Mia Thompson is currently in the action stage of change and ready to dedicate time achieving and maintaining a healthier weight. Mia Thompson is interested in becoming our patient and working on intensive lifestyle modifications including (but not limited to) diet and exercise for weight loss.  Mia Thompson's habits were reviewed today and are as follows: Her family eats meals together, she thinks her family will eat healthier with her, her desired weight loss is 74 lbs, she started gaining weight at 45 years old, her heaviest weight ever was 205 pounds, she has significant food cravings issues, she frequently makes poor food choices, she frequently eats larger portions than normal and she struggles with emotional eating.  Depression Screen Mia Thompson's Food and Mood (modified PHQ-9) score was 14.  Depression screen [facility] 2/9 11/04/2020  Decreased Interest 3  Down, Depressed, Hopeless 1  PHQ - 2 Score 4  Altered sleeping 3  Tired, decreased energy 3  Change in appetite 3  Feeling bad or failure about yourself  0  Trouble concentrating 1  Moving slowly or fidgety/restless 0  Suicidal thoughts 0  PHQ-9 Score 14  Difficult doing work/chores -   Subjective:   1. Other fatigue Mia Thompson admits to daytime somnolence and admits to waking up still tired. Patent has a history of symptoms of daytime fatigue and morning headache. Mia Thompson generally gets 6 hours of sleep per night, and states that she has nightime awakenings. Snoring is present. Apneic episodes are not present. Epworth  Sleepiness Score is 18.  2. Shortness of breath on exertion Mia Thompson notes increasing shortness of breath with exercising and seems to be worsening over time with weight gain. She notes getting out of breath sooner with activity than she used to. This has not gotten worse recently. Mia Thompson denies shortness of breath at rest or orthopnea.  3. Vitamin D deficiency Mia Thompson is on Vit D OTC, She has no recent labs in Epic and she notes fatigue.  4. B12 deficiency Mia Thompson is on B12, and her last level was below goal. She has no history of anemia, and she notes fatigue.  5. At risk for heart disease Mia Thompson is at a higher than average risk for cardiovascular disease due to obesity.   Assessment/Plan:   1. Other fatigue Mia Thompson does feel that her weight is causing her energy to be lower than it should be. Fatigue may be related to obesity, depression or many other causes. Labs will be ordered, and in the meanwhile, [place] will focus on self care including making healthy food choices, increasing physical activity and focusing on stress reduction.  - CBC with Differential/Platelet - Comprehensive metabolic panel - EKG 12-Lead - Folate - Hemoglobin A1c - Insulin, random - Lipid Panel With LDL/HDL Ratio - T3 - T4 - TSH  2. Shortness of breath on exertion Mia Thompson does feel that she gets out of breath more easily that she used to when she exercises. Mia Thompson's shortness of breath appears to be obesity related and exercise induced. She has agreed to work on weight loss and gradually increase exercise to  treat her exercise induced shortness of breath. Will continue to monitor closely.  3. Vitamin D deficiency Low Vitamin D level contributes to fatigue and are associated with obesity, breast, and colon cancer. We will check labs today. Mia Thompson will follow-up for routine testing of Vitamin D, at least 2-3 times per year to avoid over-replacement.  - VITAMIN D 25 Hydroxy (Vit-D Deficiency, Fractures)  4. B12  deficiency The diagnosis was reviewed with the patient. We will check labs today, and Mia Thompson will follow up as directed. Orders and follow up as documented in patient record.  - Vitamin B12  5. Screening for depression Mia Thompson had a positive depression screening. Depression is commonly associated with obesity and often results in emotional eating behaviors. We will monitor this closely and work on CBT to help improve the non-hunger eating patterns. Referral to Psychology may be required if no improvement is seen as she continues in our clinic.  6. At risk for heart disease Mia Thompson was given approximately 30 minutes of coronary artery disease prevention counseling today. She is 45 y.o. female and has risk factors for heart disease including obesity. We discussed intensive lifestyle modifications today with an emphasis on specific weight loss instructions and strategies.   Repetitive spaced learning was employed today to elicit superior memory formation and behavioral change.  7. Class 2 severe obesity with serious comorbidity and body mass index (BMI) of 37.0 to 37.9 in adult, unspecified obesity type [facility]) [name] is currently in the action stage of change and her goal is to continue with weight loss efforts. I recommend Mia Thompson begin the structured treatment plan as follows:  She has agreed to the Category 2 Plan.  Exercise goals: No exercise has been prescribed at this time.   Behavioral modification strategies: increasing lean protein intake.  She was informed of the importance of frequent follow-up visits to maximize her success with intensive lifestyle modifications for her multiple health conditions. She was informed we would discuss her lab results at her next visit unless there is a critical issue that needs to be addressed sooner. Mia Thompson agreed to keep her next visit at the agreed upon time to discuss these results.  Objective:   Blood pressure 107/70, pulse 84, temperature (!) 97.5 F (36.4  C), height 5\' 2"  (1.575 m), weight 204 lb (92.5 kg), SpO2 98 %. Body mass index is 37.31 kg/m.  EKG: Normal sinus rhythm, rate 84 BPM.  Indirect Calorimeter completed today shows a VO2 of 221 and a REE of 1536.  Her calculated basal metabolic rate is thus her basal metabolic rate is better than expected.  General: Cooperative, alert, well developed, in no acute distress. HEENT: Conjunctivae and lids unremarkable. Cardiovascular: Regular rhythm.  Lungs: Normal work of breathing. Neurologic: No focal deficits.   Lab Results  Component Value Date   CREATININE 1.05 (H) 11/04/2020   BUN 15 11/04/2020   NA 144 11/04/2020   K 5.0 11/04/2020   CL 109 (H) 11/04/2020   CO2 17 (L) 11/04/2020   Lab Results  Component Value Date   ALT 12 11/04/2020   AST 15 11/04/2020   ALKPHOS 57 11/04/2020   BILITOT 0.4 11/04/2020   Lab Results  Component Value Date   HGBA1C 5.2 11/04/2020   HGBA1C 5.1 05/11/2020   HGBA1C 5.1 04/11/2018   Lab Results  Component Value Date   INSULIN 12.8 11/04/2020   Lab Results  Component Value Date   TSH 2.170 11/04/2020   Lab Results  Component Value  Date   CHOL 165 11/04/2020   HDL 45 11/04/2020   LDLCALC 109 (H) 11/04/2020   TRIG 57 11/04/2020   CHOLHDL 3.0 04/11/2018   Lab Results  Component Value Date   WBC 7.6 11/04/2020   HGB 15.1 11/04/2020   HCT 43.5 11/04/2020   MCV 90 11/04/2020   PLT 311 11/04/2020   Lab Results  Component Value Date   FERRITIN 95 04/11/2017   Attestation Statements:   Reviewed by clinician on day of visit: allergies, medications, problem list, medical history, surgical history, family history, social history, and previous encounter notes.   I, Burt Knack, am acting as transcriptionist for Quillian Quince, MD.  I have reviewed the above documentation for accuracy and completeness, and I agree with the above. - Quillian Quince, MD

## 2020-11-18 ENCOUNTER — Ambulatory Visit (INDEPENDENT_AMBULATORY_CARE_PROVIDER_SITE_OTHER): Payer: BC Managed Care – PPO | Admitting: Family Medicine

## 2020-11-18 ENCOUNTER — Encounter (INDEPENDENT_AMBULATORY_CARE_PROVIDER_SITE_OTHER): Payer: Self-pay | Admitting: Family Medicine

## 2020-11-18 ENCOUNTER — Telehealth: Payer: Self-pay

## 2020-11-18 ENCOUNTER — Other Ambulatory Visit: Payer: Self-pay

## 2020-11-18 VITALS — BP 97/66 | HR 69 | Temp 97.6°F | Ht 62.0 in | Wt 202.0 lb

## 2020-11-18 DIAGNOSIS — E8881 Metabolic syndrome: Secondary | ICD-10-CM

## 2020-11-18 DIAGNOSIS — Z6837 Body mass index (BMI) 37.0-37.9, adult: Secondary | ICD-10-CM

## 2020-11-18 DIAGNOSIS — Z9189 Other specified personal risk factors, not elsewhere classified: Secondary | ICD-10-CM

## 2020-11-18 DIAGNOSIS — E559 Vitamin D deficiency, unspecified: Secondary | ICD-10-CM | POA: Diagnosis not present

## 2020-11-18 DIAGNOSIS — R748 Abnormal levels of other serum enzymes: Secondary | ICD-10-CM

## 2020-11-18 MED ORDER — BACLOFEN 10 MG PO TABS: 10.0000 mg | ORAL_TABLET | Freq: Two times a day (BID) | ORAL | 0 refills | Status: DC | PRN

## 2020-11-18 MED ORDER — VITAMIN D (ERGOCALCIFEROL) 1.25 MG (50000 UNIT) PO CAPS: 50000.0000 [IU] | ORAL_CAPSULE | ORAL | 0 refills | Status: DC

## 2020-11-18 MED ORDER — NAPROXEN 500 MG PO TABS: 500.0000 mg | ORAL_TABLET | Freq: Two times a day (BID) | ORAL | 0 refills | Status: DC | PRN

## 2020-11-18 NOTE — Telephone Encounter (Signed)
Pt verbalized understanding.

## 2020-11-18 NOTE — Telephone Encounter (Signed)
Refills sent to [facility].

## 2020-11-18 NOTE — Telephone Encounter (Signed)
Pt called in to get a refill on baclofen and naproxen pt is scheduled for 7/13 per Jolene's last ov note in jan

## 2020-11-26 NOTE — Progress Notes (Signed)
Chief Complaint:   OBESITY Mia Thompson is here to discuss her progress with her obesity treatment plan along with follow-up of her obesity related diagnoses. Mia Thompson is on the Category 2 Plan and states she is following her eating plan approximately 95% of the time. Mia Thompson states she is doing 0 minutes 0 times per week.  Today's visit was #: 2 Starting weight: 204 lbs Starting date: 11/04/2020 Today's weight: 202 lbs Today's date: 11/18/2020 Total lbs lost to date: 2 Total lbs lost since last in-office visit: 2  Interim History: Layton has done well with weight loss on her plan. She notes her hunger was mostly controlled and sometimes she struggled to eat all of her dinner protein.  Subjective:   1. Vitamin D deficiency Mia Thompson is on OTC Vit D, but her level is not yet at goal. She notes fatigue. I discussed labs with the patient today.  2. Insulin resistance Mia Thompson has a new diagnosis of insulin resistance. Her glucose and A1c are well controlled, but her fasting insulin is above goal. She notes PM polyphagia. I discussed labs with the patient today.  3. Elevated vitamin B12 level Mia Thompson is on OTC B12 and her level is elevated. I discussed labs with the patient today.  4. At risk for diabetes mellitus Mia Thompson is at higher than average risk for developing diabetes due to obesity.   Assessment/Plan:   1. Vitamin D deficiency Low Vitamin D level contributes to fatigue and are associated with obesity, breast, and colon cancer. Mia Thompson agreed to start prescription Vitamin D 50,000 IU every week with no refills. She will follow-up for routine testing of Vitamin D, at least 2-3 times per year to avoid over-replacement.  - Vitamin D, Ergocalciferol, (DRISDOL) 1.25 MG (50000 UNIT) CAPS capsule; Take 1 capsule (50,000 Units total) by mouth every 7 (seven) days.  Dispense: 4 capsule; Refill: 0  2. Insulin resistance Mia Thompson was educated on her diagnosis and proper nutrition. She will continue her  Category 2 plan, and will continue to work on weight loss, exercise, and decreasing simple carbohydrates to help decrease the risk of diabetes. Mia Thompson agreed to follow-up with [place] as directed to closely monitor her progress.  3. Elevated vitamin B12 level The diagnosis was reviewed with the patient. Mia Thompson is ok to decrease OTC B12, especially as she is now on a B12 rich diet. Orders and follow up as documented in patient record.  4. At risk for diabetes mellitus Mia Thompson was given approximately 30 minutes of diabetes education and counseling today. We discussed intensive lifestyle modifications today with an emphasis on weight loss as well as increasing exercise and decreasing simple carbohydrates in her diet. We also reviewed medication options with an emphasis on risk versus benefit of those discussed.   Repetitive spaced learning was employed today to elicit superior memory formation and behavioral change.  5. Obesity with current BMI of 37.1 Mia Thompson is currently in the action stage of change. As such, her goal is to continue with weight loss efforts. She has agreed to the Category 2 Plan.   Mia Thompson were discussed.  Behavioral modification strategies: increasing Mia protein intake, decreasing simple carbohydrates and travel eating strategies.  Mia Thompson has agreed to follow-up with our clinic in 2 weeks. She was informed of the importance of frequent follow-up visits to maximize her success with intensive lifestyle modifications for her multiple health conditions.   Objective:   Blood pressure 97/66, pulse 69, temperature 97.6 F (36.4 C), height 5'  2" (1.575 m), weight 202 lb (91.6 kg), SpO2 100 %. Body mass index is 36.95 kg/m.  General: Cooperative, alert, well developed, in no acute distress. HEENT: Conjunctivae and lids unremarkable. Cardiovascular: Regular rhythm.  Lungs: Normal work of breathing. Neurologic: No focal deficits.   Lab Results  Component Value Date    CREATININE 1.05 (H) 11/04/2020   BUN 15 11/04/2020   NA 144 11/04/2020   K 5.0 11/04/2020   CL 109 (H) 11/04/2020   CO2 17 (L) 11/04/2020   Lab Results  Component Value Date   ALT 12 11/04/2020   AST 15 11/04/2020   ALKPHOS 57 11/04/2020   BILITOT 0.4 11/04/2020   Lab Results  Component Value Date   HGBA1C 5.2 11/04/2020   HGBA1C 5.1 05/11/2020   HGBA1C 5.1 04/11/2018   Lab Results  Component Value Date   INSULIN 12.8 11/04/2020   Lab Results  Component Value Date   TSH 2.170 11/04/2020   Lab Results  Component Value Date   CHOL 165 11/04/2020   HDL 45 11/04/2020   LDLCALC 109 (H) 11/04/2020   TRIG 57 11/04/2020   CHOLHDL 3.0 04/11/2018   Lab Results  Component Value Date   WBC 7.6 11/04/2020   HGB 15.1 11/04/2020   HCT 43.5 11/04/2020   MCV 90 11/04/2020   PLT 311 11/04/2020   Lab Results  Component Value Date   FERRITIN 95 04/11/2017   Attestation Statements:   Reviewed by clinician on day of visit: allergies, medications, problem list, medical history, surgical history, family history, social history, and previous encounter notes.   I, Burt Knack, am acting as transcriptionist for Quillian Quince, MD.  I have reviewed the above documentation for accuracy and completeness, and I agree with the above. -  Quillian Quince, MD

## 2020-12-02 ENCOUNTER — Ambulatory Visit (INDEPENDENT_AMBULATORY_CARE_PROVIDER_SITE_OTHER): Payer: BC Managed Care – PPO | Admitting: Family Medicine

## 2020-12-17 ENCOUNTER — Other Ambulatory Visit: Payer: Self-pay

## 2020-12-17 ENCOUNTER — Ambulatory Visit (INDEPENDENT_AMBULATORY_CARE_PROVIDER_SITE_OTHER): Payer: BC Managed Care – PPO | Admitting: Family Medicine

## 2020-12-17 ENCOUNTER — Encounter (INDEPENDENT_AMBULATORY_CARE_PROVIDER_SITE_OTHER): Payer: Self-pay | Admitting: Family Medicine

## 2020-12-17 VITALS — BP 106/70 | HR 81 | Temp 97.5°F | Ht 62.0 in | Wt 199.0 lb

## 2020-12-17 DIAGNOSIS — Z9189 Other specified personal risk factors, not elsewhere classified: Secondary | ICD-10-CM | POA: Diagnosis not present

## 2020-12-17 DIAGNOSIS — Z6837 Body mass index (BMI) 37.0-37.9, adult: Secondary | ICD-10-CM

## 2020-12-17 DIAGNOSIS — E559 Vitamin D deficiency, unspecified: Secondary | ICD-10-CM | POA: Diagnosis not present

## 2020-12-17 MED ORDER — VITAMIN D (ERGOCALCIFEROL) 1.25 MG (50000 UNIT) PO CAPS: 50000.0000 [IU] | ORAL_CAPSULE | ORAL | 0 refills | Status: DC

## 2020-12-21 NOTE — Progress Notes (Signed)
Chief Complaint:   OBESITY Mia Thompson is here to discuss her progress with her obesity treatment plan along with follow-up of her obesity related diagnoses. Mia Thompson is on the Category 2 Plan and states she is following her eating plan approximately 95% of the time. Mia Thompson states she is doing 0 minutes 0 times per week.  Today's visit was #: 3 Starting weight: 204 lbs Starting date: 11/04/2020 Today's weight: 199 lbs Today's date: 12/17/2020 Total lbs lost to date: 5 Total lbs lost since last in-office visit: 3  Interim History: Mia Thompson continues to do well with weight loss. She notes her hunger is controlled and she is doing well with meal planning and prepping.  Subjective:   1. Vitamin D deficiency Mia Thompson is stable on Vit D, and she denies nausea, vomiting, or muscle weakness. She requests a refill today.  2. At risk for activity intolerance Mia Thompson is at risk for exercise intolerance due to limited exercise previously  Assessment/Plan:   1. Vitamin D deficiency Low Vitamin D level contributes to fatigue and are associated with obesity, breast, and colon cancer. We will refill prescription Vitamin D for 1 month. [name] will follow-up for routine testing of Vitamin D, at least 2-3 times per year to avoid over-replacement.  - Vitamin D, Ergocalciferol, (DRISDOL) 1.25 MG (50000 UNIT) CAPS capsule; Take 1 capsule (50,000 Units total) by mouth every 7 (seven) days.  Dispense: 4 capsule; Refill: 0  2. At risk for activity intolerance Mia Thompson was given approximately 15 minutes of exercise intolerance counseling today. She is 45 y.o. female and has risk factors exercise intolerance including obesity. We discussed intensive lifestyle modifications today with an emphasis on specific weight loss instructions and strategies. Mia Thompson will slowly increase activity as tolerated.  Repetitive spaced learning was employed today to elicit superior memory formation and behavioral change.  3. Obesity with  current BMI 36.4 Mia Thompson is currently in the action stage of change. As such, her goal is to continue with weight loss efforts. She has agreed to the Category 2 Plan.   Behavioral modification strategies: meal planning and cooking strategies.  Mia Thompson has agreed to follow-up with our clinic in 2 to 3 weeks. She was informed of the importance of frequent follow-up visits to maximize her success with intensive lifestyle modifications for her multiple health conditions.   Objective:   Blood pressure 106/70, pulse 81, temperature (!) 97.5 F (36.4 C), height 5\' 2"  (1.575 m), weight 199 lb (90.3 kg), SpO2 99 %. Body mass index is 36.4 kg/m.  General: Cooperative, alert, well developed, in no acute distress. HEENT: Conjunctivae and lids unremarkable. Cardiovascular: Regular rhythm.  Lungs: Normal work of breathing. Neurologic: No focal deficits.   Lab Results  Component Value Date   CREATININE 1.05 (H) 11/04/2020   BUN 15 11/04/2020   NA 144 11/04/2020   K 5.0 11/04/2020   CL 109 (H) 11/04/2020   CO2 17 (L) 11/04/2020   Lab Results  Component Value Date   ALT 12 11/04/2020   AST 15 11/04/2020   ALKPHOS 57 11/04/2020   BILITOT 0.4 11/04/2020   Lab Results  Component Value Date   HGBA1C 5.2 11/04/2020   HGBA1C 5.1 05/11/2020   HGBA1C 5.1 04/11/2018   Lab Results  Component Value Date   INSULIN 12.8 11/04/2020   Lab Results  Component Value Date   TSH 2.170 11/04/2020   Lab Results  Component Value Date   CHOL 165 11/04/2020   HDL 45 11/04/2020  LDLCALC 109 (H) 11/04/2020   TRIG 57 11/04/2020   CHOLHDL 3.0 04/11/2018   Lab Results  Component Value Date   WBC 7.6 11/04/2020   HGB 15.1 11/04/2020   HCT 43.5 11/04/2020   MCV 90 11/04/2020   PLT 311 11/04/2020   Lab Results  Component Value Date   FERRITIN 95 04/11/2017   Attestation Statements:   Reviewed by clinician on day of visit: allergies, medications, problem list, medical history, surgical history,  family history, social history, and previous encounter notes.   I, Burt Knack, am acting as transcriptionist for Quillian Quince, MD.  I have reviewed the above documentation for accuracy and completeness, and I agree with the above. -  Quillian Quince, MD

## 2021-01-08 ENCOUNTER — Other Ambulatory Visit: Payer: Self-pay | Admitting: Nurse Practitioner

## 2021-01-08 NOTE — Telephone Encounter (Signed)
Scheduled 7/13

## 2021-01-08 NOTE — Telephone Encounter (Signed)
Requested medication (s) are due for refill today: yes   Requested medication (s) are on the active medication list: yes   Last refill:  12/11/2020  Future visit scheduled: yes  Notes to clinic: Medication not assigned to a protocol, review manually   Requested Prescriptions  Pending Prescriptions Disp Refills   AIMOVIG 70 MG/ML SOAJ [Pharmacy Med Name: AIMOVIG 70 MG/ML AUTOINJECTOR] 1 mL 0    Sig: Inject 70 mg into the skin every 30 (thirty) days.      Off-Protocol Failed - 01/08/2021  3:23 PM      Failed - Medication not assigned to a protocol, review manually.      Passed - Valid encounter within last 12 months    Recent Outpatient Visits           4 months ago Class 2 obesity due to excess calories without serious comorbidity with body mass index (BMI) of 36.0 to 36.9 in adult   [facility], Jolene T, NP   8 months ago Class 2 obesity due to excess calories without serious comorbidity with body mass index (BMI) of 36.0 to 36.9 in adult   [facility], [place] T, NP   1 year ago Migraine without status migrainosus, not intractable, unspecified migraine type   [facility], [place], [place]   1 year ago Moderate episode of recurrent major depressive disorder [facility])   [facility] [name], [place]   1 year ago Chest pain, unspecified type   [facility], [name], [place]       Future Appointments             In 1 month Doreene Burke, CNM [facility]   In 1 month Larae Grooms, NP [facility]            Off-Protocol Failed - 01/08/2021  3:23 PM      Failed - Medication not assigned to a protocol, review manually.      Passed - Valid encounter within last 12 months    Recent Outpatient Visits           4 months ago Class 2 obesity due to excess calories without serious comorbidity with body mass index (BMI) of 36.0 to 36.9 in  adult   [facility], Jolene T, NP   8 months ago Class 2 obesity due to excess calories without serious comorbidity with body mass index (BMI) of 36.0 to 36.9 in adult   [facility], [place] T, NP   1 year ago Migraine without status migrainosus, not intractable, unspecified migraine type   [facility], [place], [place]   1 year ago Moderate episode of recurrent major depressive disorder [facility])   [facility] [name], [place]   1 year ago Chest pain, unspecified type   [facility], [name], [place]       Future Appointments             In 1 month Doreene Burke, CNM [facility]   In 1 month Larae Grooms, NP [facility]

## 2021-01-12 ENCOUNTER — Ambulatory Visit (INDEPENDENT_AMBULATORY_CARE_PROVIDER_SITE_OTHER): Payer: BC Managed Care – PPO | Admitting: Adult Health

## 2021-01-26 ENCOUNTER — Ambulatory Visit (INDEPENDENT_AMBULATORY_CARE_PROVIDER_SITE_OTHER): Payer: BC Managed Care – PPO | Admitting: Family Medicine

## 2021-01-26 ENCOUNTER — Encounter (INDEPENDENT_AMBULATORY_CARE_PROVIDER_SITE_OTHER): Payer: Self-pay | Admitting: Family Medicine

## 2021-01-26 ENCOUNTER — Other Ambulatory Visit: Payer: Self-pay

## 2021-01-26 VITALS — BP 111/73 | HR 73 | Temp 97.7°F | Ht 62.0 in

## 2021-01-26 DIAGNOSIS — Z6837 Body mass index (BMI) 37.0-37.9, adult: Secondary | ICD-10-CM

## 2021-01-26 DIAGNOSIS — E559 Vitamin D deficiency, unspecified: Secondary | ICD-10-CM | POA: Diagnosis not present

## 2021-01-26 MED ORDER — VITAMIN D (ERGOCALCIFEROL) 1.25 MG (50000 UNIT) PO CAPS: 50000.0000 [IU] | ORAL_CAPSULE | ORAL | 0 refills | Status: DC

## 2021-02-01 NOTE — Progress Notes (Signed)
Chief Complaint:   OBESITY Mia Thompson is here to discuss her progress with her obesity treatment plan along with follow-up of her obesity related diagnoses. [name] is on the Category 2 Plan and states she is following her eating plan approximately 80% of the time. [name] states she is walking for 30 minutes 1 time per week.  Today's visit was #: 4 Starting weight: 204 lbs Starting date: 11/04/2020 Today's weight: 195 lbs Today's date: 01/26/2021 Total lbs lost to date: 9 Total lbs lost since last in-office visit: 4  Interim History: [name] continues to do well with weight loss. She is getting bored with chicken and would like more meal ideas to keep food more interesting. She is going on vacation soon and she would like to discuss vacation strategies.  Subjective:   1. Vitamin D deficiency Mia Thompson is stable on Vit D, and she denies nausea or vomiting. Her level is not yet at goal.  Assessment/Plan:   1. Vitamin D deficiency Low Vitamin D level contributes to fatigue and are associated with obesity, breast, and colon cancer. We will refill prescription Vitamin D for 1 month. [name] will follow-up for routine testing of Vitamin D, at least 2-3 times per year to avoid over-replacement.  - Vitamin D, Ergocalciferol, (DRISDOL) 1.25 MG (50000 UNIT) CAPS capsule; Take 1 capsule (50,000 Units total) by mouth every 7 (seven) days.  Dispense: 4 capsule; Refill: 0  2. Obesity with current BMI 35.7 Devon is currently in the action stage of change. As such, her goal is to continue with weight loss efforts. She has agreed to the Category 2 Plan.   Exercise goals: As is.  Behavioral modification strategies: meal planning and cooking strategies, travel eating strategies, and celebration eating strategies.  Mia Thompson has agreed to follow-up with our clinic in 3 weeks. She was informed of the importance of frequent follow-up visits to maximize her success with intensive lifestyle modifications for her  multiple health conditions.   Objective:   Blood pressure 111/73, pulse 73, temperature 97.7 F (36.5 C), height 5\' 2"  (1.575 m), SpO2 99 %. Body mass index is 36.4 kg/m.  General: Cooperative, alert, well developed, in no acute distress. HEENT: Conjunctivae and lids unremarkable. Cardiovascular: Regular rhythm.  Lungs: Normal work of breathing. Neurologic: No focal deficits.   Lab Results  Component Value Date   CREATININE 1.05 (H) 11/04/2020   BUN 15 11/04/2020   NA 144 11/04/2020   K 5.0 11/04/2020   CL 109 (H) 11/04/2020   CO2 17 (L) 11/04/2020   Lab Results  Component Value Date   ALT 12 11/04/2020   AST 15 11/04/2020   ALKPHOS 57 11/04/2020   BILITOT 0.4 11/04/2020   Lab Results  Component Value Date   HGBA1C 5.2 11/04/2020   HGBA1C 5.1 05/11/2020   HGBA1C 5.1 04/11/2018   Lab Results  Component Value Date   INSULIN 12.8 11/04/2020   Lab Results  Component Value Date   TSH 2.170 11/04/2020   Lab Results  Component Value Date   CHOL 165 11/04/2020   HDL 45 11/04/2020   LDLCALC 109 (H) 11/04/2020   TRIG 57 11/04/2020   CHOLHDL 3.0 04/11/2018   Lab Results  Component Value Date   WBC 7.6 11/04/2020   HGB 15.1 11/04/2020   HCT 43.5 11/04/2020   MCV 90 11/04/2020   PLT 311 11/04/2020   Lab Results  Component Value Date   FERRITIN 95 04/11/2017   Attestation Statements:   Reviewed by  clinician on day of visit: allergies, medications, problem list, medical history, surgical history, family history, social history, and previous encounter notes.  Time spent on visit including pre-visit chart review and post-visit care and charting was 32 minutes.    I, Burt Knack, am acting as transcriptionist for Quillian Quince, MD.  I have reviewed the above documentation for accuracy and completeness, and I agree with the above. -  Quillian Quince, MD

## 2021-02-09 ENCOUNTER — Other Ambulatory Visit: Payer: Self-pay | Admitting: Nurse Practitioner

## 2021-02-09 NOTE — Telephone Encounter (Signed)
Requested medication (s) are due for refill today - yes  Requested medication (s) are on the active medication list -yes  Future visit scheduled -yes  Last refill: 01/11/21  Notes to clinic: Request RF- medication not assigned to protocol  Requested Prescriptions  Pending Prescriptions Disp Refills   AIMOVIG 70 MG/ML SOAJ [Pharmacy Med Name: AIMOVIG 70 MG/ML AUTOINJECTOR] 1 mL 0    Sig: Inject 70 mg into the skin every 30 (thirty) days.      Off-Protocol Failed - 02/09/2021 10:36 AM      Failed - Medication not assigned to a protocol, review manually.      Passed - Valid encounter within last 12 months    Recent Outpatient Visits           6 months ago Class 2 obesity due to excess calories without serious comorbidity with body mass index (BMI) of 36.0 to 36.9 in adult   [facility], Jolene T, NP   9 months ago Class 2 obesity due to excess calories without serious comorbidity with body mass index (BMI) of 36.0 to 36.9 in adult   [facility] [place], [place] T, NP   1 year ago Migraine without status migrainosus, not intractable, unspecified migraine type   [facility], [place], [place]   1 year ago Moderate episode of recurrent major depressive disorder [facility])   [facility] [name], [place]   1 year ago Chest pain, unspecified type   [facility], [name], [place]       Future Appointments             In 1 week Doreene Burke, CNM [facility]   In 1 week Larae Grooms, NP [facility], [facility]            Off-Protocol Failed - 02/09/2021 10:36 AM      Failed - Medication not assigned to a protocol, review manually.      Passed - Valid encounter within last 12 months    Recent Outpatient Visits           6 months ago Class 2 obesity due to excess calories without serious comorbidity with body mass index (BMI) of 36.0 to 36.9 in adult    [facility], Jolene T, NP   9 months ago Class 2 obesity due to excess calories without serious comorbidity with body mass index (BMI) of 36.0 to 36.9 in adult   [facility], [place] T, NP   1 year ago Migraine without status migrainosus, not intractable, unspecified migraine type   [facility], [place], [place]   1 year ago Moderate episode of recurrent major depressive disorder [facility])   [facility] [name], [place]   1 year ago Chest pain, unspecified type   [facility], [name], [place]       Future Appointments             In 1 week Doreene Burke, CNM [facility]   In 1 week Larae Grooms, NP [facility], [facility]                 Requested Prescriptions  Pending Prescriptions Disp Refills   AIMOVIG 70 MG/ML SOAJ [Pharmacy Med Name: AIMOVIG 70 MG/ML AUTOINJECTOR] 1 mL 0    Sig: Inject 70 mg into the skin every 30 (thirty) days.      Off-Protocol Failed -  02/09/2021 10:36 AM      Failed - Medication not assigned to a protocol, review manually.      Passed - Valid encounter within last 12 months    Recent Outpatient Visits           6 months ago Class 2 obesity due to excess calories without serious comorbidity with body mass index (BMI) of 36.0 to 36.9 in adult   [facility], Jolene T, NP   9 months ago Class 2 obesity due to excess calories without serious comorbidity with body mass index (BMI) of 36.0 to 36.9 in adult   [facility] [place], [place] T, NP   1 year ago Migraine without status migrainosus, not intractable, unspecified migraine type   [facility], [place], [place]   1 year ago Moderate episode of recurrent major depressive disorder [facility])   [facility] [name], [place]   1 year ago Chest pain, unspecified type   [facility], [name], [place]       Future Appointments             In 1 week Doreene Burke, CNM [facility]   In 1 week Larae Grooms, NP [facility], [facility]            Off-Protocol Failed - 02/09/2021 10:36 AM      Failed - Medication not assigned to a protocol, review manually.      Passed - Valid encounter within last 12 months    Recent Outpatient Visits           6 months ago Class 2 obesity due to excess calories without serious comorbidity with body mass index (BMI) of 36.0 to 36.9 in adult   [facility], Jolene T, NP   9 months ago Class 2 obesity due to excess calories without serious comorbidity with body mass index (BMI) of 36.0 to 36.9 in adult   [facility] [place], [place] T, NP   1 year ago Migraine without status migrainosus, not intractable, unspecified migraine type   [facility], [place], [place]   1 year ago Moderate episode of recurrent major depressive disorder [facility])   [facility] [name], [place]   1 year ago Chest pain, unspecified type   [facility], [name], [place]       Future Appointments             In 1 week Doreene Burke, CNM [facility]   In 1 week Larae Grooms, NP [facility], [facility]

## 2021-02-09 NOTE — Telephone Encounter (Signed)
Pt is scheduled 7/13

## 2021-02-16 ENCOUNTER — Encounter: Payer: Self-pay | Admitting: Certified Nurse Midwife

## 2021-02-16 ENCOUNTER — Other Ambulatory Visit: Payer: Self-pay

## 2021-02-16 ENCOUNTER — Ambulatory Visit (INDEPENDENT_AMBULATORY_CARE_PROVIDER_SITE_OTHER): Payer: BC Managed Care – PPO | Admitting: Certified Nurse Midwife

## 2021-02-16 ENCOUNTER — Other Ambulatory Visit (HOSPITAL_COMMUNITY)
Admission: RE | Admit: 2021-02-16 | Discharge: 2021-02-16 | Disposition: A | Discharge status: Home / Self Care | Payer: BC Managed Care – PPO | Source: Ambulatory Visit | Attending: Certified Nurse Midwife | Admitting: Certified Nurse Midwife

## 2021-02-16 VITALS — BP 116/79 | HR 112 | Ht 62.0 in | Wt 197.4 lb

## 2021-02-16 DIAGNOSIS — Z01419 Encounter for gynecological examination (general) (routine) without abnormal findings: Secondary | ICD-10-CM | POA: Insufficient documentation

## 2021-02-16 DIAGNOSIS — Z1231 Encounter for screening mammogram for malignant neoplasm of breast: Secondary | ICD-10-CM

## 2021-02-16 DIAGNOSIS — Z124 Encounter for screening for malignant neoplasm of cervix: Secondary | ICD-10-CM | POA: Insufficient documentation

## 2021-02-16 DIAGNOSIS — Z23 Encounter for immunization: Secondary | ICD-10-CM | POA: Diagnosis not present

## 2021-02-16 MED ORDER — LEVONORGEST-ETH ESTRAD 91-DAY 0.15-0.03 &0.01 MG PO TABS: 1.0000 | ORAL_TABLET | Freq: Every day | ORAL | 11 refills | Status: DC

## 2021-02-16 MED ORDER — TETANUS-DIPHTH-ACELL PERTUSSIS 5-2.5-18.5 LF-MCG/0.5 IM SUSY
0.5000 mL | PREFILLED_SYRINGE | Freq: Once | INTRAMUSCULAR | Status: AC
  Administered 2021-02-16: 0.5 mL via INTRAMUSCULAR

## 2021-02-16 NOTE — Progress Notes (Signed)
GYNECOLOGY ANNUAL PREVENTATIVE CARE ENCOUNTER NOTE  History:     Mia Thompson is a 45 y.o. G13P3003 female here for a routine annual gynecologic exam.  Current complaints: continues to have hot flashes some times at night..   Denies abnormal vaginal bleeding, discharge, pelvic pain, problems with intercourse or other gynecologic concerns.     Social Relationship: married  Living:spouse and children Work: Associate Professor  Exercise:  walks 2 x wk  Smoke/Alcohol/drug use: occasional alcohol use , denies drugs and smoking   Gynecologic History No LMP recorded. (Menstrual status: Oral contraceptives). Contraception: OCP (estrogen/progesterone) Last Pap: 08/25/15. Results were: normal with negative HPV Last mammogram: 02/2020. Results were: no new cysts. WNL   Upstream - 02/16/21 2094       Pregnancy Intention Screening   Does the patient want to become pregnant in the next year? No    Does the patient's partner want to become pregnant in the next year? No    Would the patient like to discuss contraceptive options today? No      Contraception Wrap Up   Contraception Counseling Provided No            The pregnancy intention screening data noted above was reviewed. Potential methods of contraception were discussed. The patient elected to proceed with OCP.   Obstetric History OB History  Gravida Para Term Preterm AB Living  3 3 3     3   SAB IAB Ectopic Multiple Live Births          3    # Outcome Date GA Lbr Len/2nd Weight Sex Delivery Anes PTL Lv  3 Term 02/09/10   7 lb 6 oz (3.345 kg) M Vag-Spont  N LIV  2 Term 02/26/04   8 lb 6 oz (3.799 kg) F Vag-Spont  N LIV  1 Term 11/21/00   7 lb 11 oz (3.487 kg) F Vag-Spont  N LIV    Past Medical History:  Diagnosis Date   Anxiety    Depression    GERD (gastroesophageal reflux disease)    Migraines    Migraines    sees Neuro   Prolapse of female bladder, acquired     Past Surgical History:  Procedure Laterality Date    IUD REMOVAL N/A 05/08/2017   Procedure: INTRAUTERINE DEVICE (IUD) REMOVAL;  Surgeon: 07/08/2017, MD;  Location: [facility] ORS;  Service: Gynecology;  Laterality: N/A;   LAPAROSCOPIC LYSIS OF ADHESIONS  05/08/2017   Procedure: LAPAROSCOPIC LYSIS OF ADHESIONS;  Surgeon: 07/08/2017, MD;  Location: [facility] ORS;  Service: Gynecology;;   LAPAROSCOPY N/A 05/08/2017   Procedure: LAPAROSCOPY OPERATIVE;  Surgeon: 07/08/2017, MD;  Location: [facility] ORS;  Service: Gynecology;  Laterality: N/A;   THUMB SURGERY Left AGE 6   WISDOM TOOTH EXTRACTION      Current Outpatient Medications on File Prior to Visit  Medication Sig Dispense Refill   AIMOVIG 70 MG/ML SOAJ Inject 70 mg into the skin every 30 (thirty) days. 1 mL 0   ARIPiprazole (ABILIFY) 2 MG tablet Take 1 tablet (2 mg total) by mouth daily. 90 tablet 4   baclofen (LIORESAL) 10 MG tablet Take 1 tablet (10 mg total) by mouth 2 (two) times daily as needed for muscle spasms. 60 tablet 0   cetirizine (ZYRTEC) 10 MG tablet Take 10 mg by mouth at bedtime.     DULoxetine (CYMBALTA) 60 MG capsule Take 1 capsule (60 mg total) by mouth daily. 90 capsule 4   Levonorgestrel-Ethinyl Estradiol (  CAMRESE) 0.15-0.03 &0.01 MG tablet Take 1 tablet by mouth at bedtime. 28 tablet 5   Multiple Vitamin (MULTI-VITAMINS) TABS Take 1 tablet by mouth daily.      naproxen (NAPROSYN) 500 MG tablet Take 1 tablet (500 mg total) by mouth 2 (two) times daily as needed for headache. 60 tablet 0   pantoprazole (PROTONIX) 40 MG tablet Take 1 tablet (40 mg total) by mouth daily. 90 tablet 4   promethazine (PHENERGAN) 25 MG tablet Take 1 tablet (25 mg total) by mouth every 8 (eight) hours as needed for nausea or vomiting. 20 tablet 0   rizatriptan (MAXALT) 10 MG tablet Take 1 tablet (10 mg total) by mouth as needed for migraine. May repeat in 2 hours if needed 10 tablet 2   topiramate (TOPAMAX) 200 MG tablet Take 1 tablet (200 mg total) by mouth daily. 90 tablet 4   Vitamin D,  Ergocalciferol, (DRISDOL) 1.25 MG (50000 UNIT) CAPS capsule Take 1 capsule (50,000 Units total) by mouth every 7 (seven) days. 4 capsule 0   No current facility-administered medications on file prior to visit.    Allergies  Allergen Reactions   Vantin [Cefpodoxime] Rash    Shortness of breath    Social History:  reports that she has quit smoking. She has never used smokeless tobacco. She reports previous alcohol use. She reports that she does not use drugs.  Family History  Adopted: Yes  Problem Relation Age of Onset   Breast cancer Neg Hx     The following portions of the patient's history were reviewed and updated as appropriate: allergies, current medications, past family history, past medical history, past social history, past surgical history and problem list.  Review of Systems Pertinent items noted in HPI and remainder of comprehensive ROS otherwise negative.  Physical Exam:  BP 116/79   Pulse (!) 112   Ht 5\' 2"  (1.575 m)   Wt 197 lb 6.4 oz (89.5 kg)   BMI 36.10 kg/m  CONSTITUTIONAL: Well-developed, well-nourished , over weight female in no acute distress.  HENT:  Normocephalic, atraumatic, External right and left ear normal. Oropharynx is clear and moist EYES: Conjunctivae and EOM are normal. Pupils are equal, round, and reactive to light. No scleral icterus.  NECK: Normal range of motion, supple, no masses.  Normal thyroid.  SKIN: Skin is warm and dry. No rash noted. Not diaphoretic. No erythema. No pallor. MUSCULOSKELETAL: Normal range of motion. No tenderness.  No cyanosis, clubbing, or edema.  2+ distal pulses. NEUROLOGIC: Alert and oriented to person, place, and time. Normal reflexes, muscle tone coordination.  PSYCHIATRIC: Normal mood and affect. Normal behavior. Normal judgment and thought content. CARDIOVASCULAR: Normal heart rate noted, regular rhythm RESPIRATORY: Clear to auscultation bilaterally. Effort and breath sounds normal, no problems with respiration  noted. BREASTS: Symmetric in size. No masses, tenderness, skin changes, nipple drainage, or lymphadenopathy bilaterally.  ABDOMEN: Soft, no distention noted.  No tenderness, rebound or guarding.  PELVIC: Normal appearing external genitalia and urethral meatus; normal appearing vaginal mucosa and cervix.  No abnormal discharge noted.  Pap smear obtained.  Normal uterine size, no other palpable masses, no uterine or adnexal tenderness.  .   Assessment and Plan:    1. Well woman exam with routine gynecological exam  Pap:Will follow up results of pap smear and manage accordingly. Mammogram : ordered Labs: none due Refills/ orders: Tdap today, OCP refill  Referral: none  Discussed self help measures for management of night sweats. She verbalizes and  agrees.  Routine preventative health maintenance measures emphasized. Please refer to After Visit Summary for other counseling recommendations.      Doreene Burke, CNM [facility],  [facility]

## 2021-02-16 NOTE — Addendum Note (Signed)
Addended by: Lady Deutscher on: 02/16/2021 09:48 AM   Modules accepted: Orders

## 2021-02-17 ENCOUNTER — Ambulatory Visit: Payer: BC Managed Care – PPO | Admitting: Nurse Practitioner

## 2021-02-17 ENCOUNTER — Encounter: Payer: Self-pay | Admitting: Nurse Practitioner

## 2021-02-17 VITALS — BP 98/66 | HR 66 | Temp 97.5°F | Wt 199.2 lb

## 2021-02-17 DIAGNOSIS — F331 Major depressive disorder, recurrent, moderate: Secondary | ICD-10-CM | POA: Diagnosis not present

## 2021-02-17 DIAGNOSIS — F411 Generalized anxiety disorder: Secondary | ICD-10-CM

## 2021-02-17 DIAGNOSIS — E559 Vitamin D deficiency, unspecified: Secondary | ICD-10-CM | POA: Diagnosis not present

## 2021-02-17 DIAGNOSIS — K582 Mixed irritable bowel syndrome: Secondary | ICD-10-CM | POA: Diagnosis not present

## 2021-02-17 DIAGNOSIS — G43909 Migraine, unspecified, not intractable, without status migrainosus: Secondary | ICD-10-CM

## 2021-02-17 MED ORDER — LACTOBACILLUS PROBIOTIC PO TABS: 5.0000 | ORAL_TABLET | Freq: Every day | ORAL | 1 refills | Status: AC

## 2021-02-17 MED ORDER — VITAMIN D (ERGOCALCIFEROL) 1.25 MG (50000 UNIT) PO CAPS: 50000.0000 [IU] | ORAL_CAPSULE | ORAL | 0 refills | Status: DC

## 2021-02-17 MED ORDER — VITAMIN D (ERGOCALCIFEROL) 1.25 MG (50000 UNIT) PO CAPS: 50000.0000 [IU] | ORAL_CAPSULE | ORAL | 1 refills | Status: DC

## 2021-02-17 NOTE — Progress Notes (Signed)
BP 98/66   Pulse 66   Temp (!) 97.5 F (36.4 C)   Wt 199 lb 3.2 oz (90.4 kg)   SpO2 99%   BMI 36.43 kg/m    Subjective:    Patient ID: Mia Thompson, female    DOB: [birth year], 45 y.o.   MRN: [id number]  HPI: Mia Thompson is a 45 y.o. female  Chief Complaint  Patient presents with   Obesity   Migraine   Depression   Irritable Bowel Syndrome   DEPRESSION/ANXIETY Patient feels like her depression and anxiety are well controlled.  She is still taking the Abilify 72m and Cymbalta 667m  Denies SI. Denies concerns regarding her mood in office today.   [place] Visit from [place]17/2021 [id number] Total Score 6       MIGRAINES Well controlled.  Patient states the Aimovig is extremely helpful and controlling the migraines. Patient states since she has bene on the Aimovig she would say she has headaches and not migraines and gets 2-3 per month.  Patient takes Naproxen and it takes care of the pain.   IRRITABLE BOWEL SYNDROME Patient feels like her IBS is acting up.  Patient states she can go all week where she is constipation then the next week she is having diarrhea. Sometimes she is going 2-3x per day.  Patient has seen a GI doctor who recommended that she take a probiotic which really hasn't helped.  Patient has been seeing healthy weight and wellness. Patient states she is doing a diet with a lot of fruits and vegetables and protein.  Patient states she feel like it has worsened since she has been changing her diet.   Denies HA, CP, SOB, dizziness, palpitations, visual changes, and lower extremity swelling.  Relevant past medical, surgical, family and social history reviewed and updated as indicated. Interim medical history since our last visit reviewed. Allergies and medications reviewed and updated.  Review of Systems  Eyes:  Negative for visual disturbance.  Respiratory:  Negative for cough, chest tightness and shortness of breath.    Cardiovascular:  Negative for chest pain, palpitations and leg swelling.  Gastrointestinal:  Positive for abdominal pain, constipation and diarrhea.  Neurological:  Negative for dizziness and headaches.  Psychiatric/Behavioral:  Positive for dysphoric mood. Negative for suicidal ideas. The patient is nervous/anxious.    Per HPI unless specifically indicated above     Objective:    BP 98/66   Pulse 66   Temp (!) 97.5 F (36.4 C)   Wt 199 lb 3.2 oz (90.4 kg)   SpO2 99%   BMI 36.43 kg/m   Wt Readings from Last 3 Encounters:  02/17/21 199 lb 3.2 oz (90.4 kg)  02/16/21 197 lb 6.4 oz (89.5 kg)  12/17/20 199 lb (90.3 kg)    Physical Exam Vitals and nursing note reviewed.  Constitutional:      General: She is not in acute distress.    Appearance: Normal appearance. She is normal weight. She is not ill-appearing, toxic-appearing or diaphoretic.  HENT:     Head: Normocephalic.     Right Ear: External ear normal.     Left Ear: External ear normal.     Nose: Nose normal.     Mouth/Throat:     Mouth: Mucous membranes are moist.     Pharynx: Oropharynx is clear.  Eyes:     General:        Right eye: No discharge.  Left eye: No discharge.     Extraocular Movements: Extraocular movements intact.     Conjunctiva/sclera: Conjunctivae normal.     Pupils: Pupils are equal, round, and reactive to light.  Cardiovascular:     Rate and Rhythm: Normal rate and regular rhythm.     Heart sounds: No murmur heard. Pulmonary:     Effort: Pulmonary effort is normal. No respiratory distress.     Breath sounds: Normal breath sounds. No wheezing or rales.  Abdominal:     General: Abdomen is flat. Bowel sounds are normal.     Palpations: Abdomen is soft.     Tenderness: There is no abdominal tenderness. There is no guarding.  Musculoskeletal:     Cervical back: Normal range of motion and neck supple.  Skin:    General: Skin is warm and dry.     Capillary Refill: Capillary refill takes  less than 2 seconds.  Neurological:     General: No focal deficit present.     Mental Status: She is alert and oriented to person, place, and time. Mental status is at baseline.  Psychiatric:        Mood and Affect: Mood normal.        Behavior: Behavior normal.        Thought Content: Thought content normal.        Judgment: Judgment normal.    Results for orders placed or performed in visit on 11/04/20  Vitamin B12  Result Value Ref Range   Vitamin B-12 >2000 (H) 232 - 1245 pg/mL  CBC with Differential/Platelet  Result Value Ref Range   WBC 7.6 3.4 - 10.8 x10E3/uL   RBC 4.81 3.77 - 5.28 x10E6/uL   Hemoglobin 15.1 11.1 - 15.9 g/dL   Hematocrit 43.5 34.0 - 46.6 %   MCV 90 79 - 97 fL   MCH 31.4 26.6 - 33.0 pg   MCHC 34.7 31.5 - 35.7 g/dL   RDW 12.3 11.7 - 15.4 %   Platelets 311 150 - 450 x10E3/uL   Neutrophils 66 Not Estab. %   Lymphs 23 Not Estab. %   Monocytes 6 Not Estab. %   Eos 4 Not Estab. %   Basos 1 Not Estab. %   Neutrophils Absolute 5.0 1.4 - 7.0 x10E3/uL   Lymphocytes Absolute 1.7 0.7 - 3.1 x10E3/uL   Monocytes Absolute 0.5 0.1 - 0.9 x10E3/uL   EOS (ABSOLUTE) 0.3 0.0 - 0.4 x10E3/uL   Basophils Absolute 0.1 0.0 - 0.2 x10E3/uL   Immature Granulocytes 0 Not Estab. %   Immature Grans (Abs) 0.0 0.0 - 0.1 x10E3/uL  Comprehensive metabolic panel  Result Value Ref Range   Glucose 81 65 - 99 mg/dL   BUN 15 6 - 24 mg/dL   Creatinine, Ser 1.05 (H) 0.57 - 1.00 mg/dL   eGFR 68 >59 mL/min/1.73   BUN/Creatinine Ratio 14 9 - 23   Sodium 144 134 - 144 mmol/L   Potassium 5.0 3.5 - 5.2 mmol/L   Chloride 109 (H) 96 - 106 mmol/L   CO2 17 (L) 20 - 29 mmol/L   Calcium 9.5 8.7 - 10.2 mg/dL   Total Protein 6.5 6.0 - 8.5 g/dL   Albumin 4.4 3.8 - 4.8 g/dL   Globulin, Total 2.1 1.5 - 4.5 g/dL   Albumin/Globulin Ratio 2.1 1.2 - 2.2   Bilirubin Total 0.4 0.0 - 1.2 mg/dL   Alkaline Phosphatase 57 44 - 121 IU/L   AST 15 0 - 40 IU/L   ALT  12 0 - 32 IU/L  Folate  Result Value Ref  Range   Folate 17.9 >3.0 ng/mL  Hemoglobin A1c  Result Value Ref Range   Hgb A1c MFr Bld 5.2 4.8 - 5.6 %   Est. average glucose Bld gHb Est-mCnc 103 mg/dL  Insulin, random  Result Value Ref Range   INSULIN 12.8 2.6 - 24.9 uIU/mL  Lipid Panel With LDL/HDL Ratio  Result Value Ref Range   Cholesterol, Total 165 100 - 199 mg/dL   Triglycerides 57 0 - 149 mg/dL   HDL 45 >39 mg/dL   VLDL Cholesterol Cal 11 5 - 40 mg/dL   LDL Chol Calc (NIH) 109 (H) 0 - 99 mg/dL   LDL/HDL Ratio 2.4 0.0 - 3.2 ratio  T3  Result Value Ref Range   T3, Total 168 71 - 180 ng/dL  T4  Result Value Ref Range   T4, Total 10.0 4.5 - 12.0 ug/dL  TSH  Result Value Ref Range   TSH 2.170 0.450 - 4.500 uIU/mL  VITAMIN D 25 Hydroxy (Vit-D Deficiency, Fractures)  Result Value Ref Range   Vit D, 25-Hydroxy 37.7 30.0 - 100.0 ng/mL      Assessment & Plan:   Problem List Items Addressed This Visit       Cardiovascular and Mediastinum   Migraine    Chronic.  Controlled.  Continue with current medication regimen of Aimovig 55m.  Labs ordered today.  Return to clinic in 6 months for reevaluation.  Call sooner if concerns arise.           Other   Anxiety, generalized    Chronic.  Controlled.  Continue with current medication regimen of Cymbalta 643mand Abilify 63m67m Labs ordered today.  Return to clinic in 6 months for reevaluation.  Call sooner if concerns arise.         Depression - Primary    Chronic.  Controlled.  Continue with current medication regimen of Cymbalta 51m863md Abilify 63mg 30mLabs ordered today.  Return to clinic in 6 months for reevaluation.  Call sooner if concerns arise.         Other Visit Diagnoses     Vitamin D deficiency       Labs ordered today. Continue with current dose of Vitamin D.   Relevant Medications   Vitamin D, Ergocalciferol, (DRISDOL) 1.25 MG (50000 UNIT) CAPS capsule   Irritable bowel syndrome with both constipation and diarrhea       Recommend Lactobacillus  daily to help with gut health. Continue with healthy diet.    Relevant Medications   Lactobacillus Probiotic TABS        Follow up plan: Return in about 6 months (around 08/20/2021) for Physical and Fasting labs.

## 2021-02-18 ENCOUNTER — Ambulatory Visit (INDEPENDENT_AMBULATORY_CARE_PROVIDER_SITE_OTHER): Payer: BC Managed Care – PPO | Admitting: Family Medicine

## 2021-02-18 NOTE — Assessment & Plan Note (Signed)
Chronic.  Controlled.  Continue with current medication regimen of Aimovig 70mg .  Labs ordered today.  Return to clinic in 6 months for reevaluation.  Call sooner if concerns arise.

## 2021-02-18 NOTE — Assessment & Plan Note (Addendum)
Chronic.  Controlled.  Continue with current medication regimen of Cymbalta 60mg  and Abilify 2mg .  Labs ordered today.  Return to clinic in 6 months for reevaluation.  Call sooner if concerns arise.

## 2021-02-18 NOTE — Assessment & Plan Note (Addendum)
Chronic.  Controlled.  Continue with current medication regimen of Cymbalta 60mg and Abilify 2mg.  Labs ordered today.  Return to clinic in 6 months for reevaluation.  Call sooner if concerns arise.   

## 2021-02-24 LAB — CYTOLOGY - PAP
Comment: NEGATIVE
Comment: NEGATIVE
Diagnosis: NEGATIVE
HPV 16: NEGATIVE
HPV 18 / 45: NEGATIVE
High risk HPV: POSITIVE — AB

## 2021-03-02 ENCOUNTER — Other Ambulatory Visit: Payer: Self-pay | Admitting: Nurse Practitioner

## 2021-03-10 ENCOUNTER — Other Ambulatory Visit: Payer: Self-pay | Admitting: Nurse Practitioner

## 2021-03-10 NOTE — Telephone Encounter (Signed)
Requested medication (s) are due for refill today: Yes  Requested medication (s) are on the active medication list: Yes  Last refill:  02/09/21  Future visit scheduled: Yes  Notes to clinic:  Unable to refill per protocol, not assigned to protocol     Requested Prescriptions  Pending Prescriptions Disp Refills   AIMOVIG 70 MG/ML SOAJ [Pharmacy Med Name: AIMOVIG 70 MG/ML AUTOINJECTOR] 1 mL 0    Sig: Inject 70 mg into the skin every 30 (thirty) days.      Off-Protocol Failed - 03/10/2021  9:44 AM      Failed - Medication not assigned to a protocol, review manually.      Passed - Valid encounter within last 12 months    Recent Outpatient Visits           3 weeks ago Moderate episode of recurrent major depressive disorder (HCC)   [facility] Larae Grooms, NP   7 months ago Class 2 obesity due to excess calories without serious comorbidity with body mass index (BMI) of 36.0 to 36.9 in adult   [facility], Jolene T, NP   10 months ago Class 2 obesity due to excess calories without serious comorbidity with body mass index (BMI) of 36.0 to 36.9 in adult   [facility] [place], Standard T, NP   1 year ago Migraine without status migrainosus, not intractable, unspecified migraine type   [facility] [place], [place]   1 year ago Moderate episode of recurrent major depressive disorder [facility])   [facility] [name], [place]       Future Appointments             In 5 months Larae Grooms, NP [facility]   In 11 months Doreene Burke, CNM [facility]            Off-Protocol Failed - 03/10/2021  9:44 AM      Failed - Medication not assigned to a protocol, review manually.      Passed - Valid encounter within last 12 months    Recent Outpatient Visits           3 weeks ago Moderate episode of recurrent major depressive disorder (HCC)   [facility] Larae Grooms, NP   7 months ago Class 2 obesity due to excess calories without serious comorbidity with body mass index (BMI) of 36.0 to 36.9 in adult   [facility], Jolene T, NP   10 months ago Class 2 obesity due to excess calories without serious comorbidity with body mass index (BMI) of 36.0 to 36.9 in adult   [facility] [place], Spencer T, NP   1 year ago Migraine without status migrainosus, not intractable, unspecified migraine type   [facility], [place], [place]   1 year ago Moderate episode of recurrent major depressive disorder [facility])   [facility] [name], [place]       Future Appointments             In 5 months Larae Grooms, NP [facility], [facility]   In 11 months Doreene Burke, CNM [facility]

## 2021-04-06 ENCOUNTER — Other Ambulatory Visit: Payer: Self-pay

## 2021-04-06 ENCOUNTER — Ambulatory Visit
Admission: RE | Admit: 2021-04-06 | Discharge: 2021-04-06 | Disposition: A | Discharge status: Home / Self Care | Payer: BC Managed Care – PPO | Source: Ambulatory Visit | Attending: Certified Nurse Midwife | Admitting: Certified Nurse Midwife

## 2021-04-06 DIAGNOSIS — Z1231 Encounter for screening mammogram for malignant neoplasm of breast: Secondary | ICD-10-CM | POA: Insufficient documentation

## 2021-04-06 DIAGNOSIS — Z01419 Encounter for gynecological examination (general) (routine) without abnormal findings: Secondary | ICD-10-CM

## 2021-04-08 ENCOUNTER — Other Ambulatory Visit: Payer: Self-pay | Admitting: Nurse Practitioner

## 2021-04-08 NOTE — Telephone Encounter (Signed)
Requested medication (s) are due for refill today: yes  Requested medication (s) are on the active medication list: yes  Last refill:  03/10/21 #55ml 0 refills  Future visit scheduled: yes  Notes to clinic:  medication not assigned to a protocol     Requested Prescriptions  Pending Prescriptions Disp Refills   AIMOVIG 70 MG/ML SOAJ [Pharmacy Med Name: AIMOVIG 70 MG/ML AUTOINJECTOR] 1 mL 0    Sig: Inject 70 mg into the skin every 30 (thirty) days.     Off-Protocol Failed - 04/08/2021  1:30 PM      Failed - Medication not assigned to a protocol, review manually.      Passed - Valid encounter within last 12 months    Recent Outpatient Visits           1 month ago Moderate episode of recurrent major depressive disorder (HCC)   [facility] Larae Grooms, NP   7 months ago Class 2 obesity due to excess calories without serious comorbidity with body mass index (BMI) of 36.0 to 36.9 in adult   [facility], Jolene T, NP   11 months ago Class 2 obesity due to excess calories without serious comorbidity with body mass index (BMI) of 36.0 to 36.9 in adult   [facility] [place], [place] T, NP   1 year ago Migraine without status migrainosus, not intractable, unspecified migraine type   [facility], [place], [place]   1 year ago Moderate episode of recurrent major depressive disorder [facility])   [facility] [name], [place]       Future Appointments             In 4 months Larae Grooms, NP [facility]   In 10 months Doreene Burke, CNM [facility]           Off-Protocol Failed - 04/08/2021  1:30 PM      Failed - Medication not assigned to a protocol, review manually.      Passed - Valid encounter within last 12 months    Recent Outpatient Visits           1 month ago Moderate episode of recurrent major depressive disorder (HCC)   [facility]  Larae Grooms, NP   7 months ago Class 2 obesity due to excess calories without serious comorbidity with body mass index (BMI) of 36.0 to 36.9 in adult   [facility], Jolene T, NP   11 months ago Class 2 obesity due to excess calories without serious comorbidity with body mass index (BMI) of 36.0 to 36.9 in adult   [facility] [place], [place] T, NP   1 year ago Migraine without status migrainosus, not intractable, unspecified migraine type   [facility], [place], [place]   1 year ago Moderate episode of recurrent major depressive disorder [facility])   [facility] [name], [place]       Future Appointments             In 4 months Larae Grooms, NP [facility], [facility]   In 10 months Doreene Burke, CNM [facility]

## 2021-04-09 NOTE — Telephone Encounter (Signed)
Patient last seen 02/17/21 and has appointment 08/20/20

## 2021-05-07 ENCOUNTER — Other Ambulatory Visit: Payer: Self-pay | Admitting: Nurse Practitioner

## 2021-07-27 ENCOUNTER — Other Ambulatory Visit: Payer: Self-pay

## 2021-07-27 MED ORDER — PANTOPRAZOLE SODIUM 40 MG PO TBEC: 40.0000 mg | DELAYED_RELEASE_TABLET | Freq: Every day | ORAL | 4 refills | Status: DC

## 2021-08-11 ENCOUNTER — Other Ambulatory Visit: Payer: Self-pay | Admitting: Nurse Practitioner

## 2021-08-11 NOTE — Telephone Encounter (Signed)
Requested medications are due for refill today.  yes  Requested medications are on the active medications list.  yes  Last refill. 04/09/2021  Future visit scheduled.   yes  Notes to clinic.  Medication not delegated.    Requested Prescriptions  Pending Prescriptions Disp Refills   AIMOVIG 70 MG/ML SOAJ [Pharmacy Med Name: AIMOVIG 70 MG/ML AUTOINJECTOR] 1 mL 0    Sig: Inject 70 mg into the skin every 30 (thirty) days.     Off-Protocol Failed - 08/11/2021  9:03 AM      Failed - Medication not assigned to a protocol, review manually.      Passed - Valid encounter within last 12 months    Recent Outpatient Visits           5 months ago Moderate episode of recurrent major depressive disorder ([place])   [place], Karen, NP   12 months ago Class 2 obesity due to excess calories without serious comorbidity with body mass index (BMI) of 36.0 to 36.9 in adult   [facility] [place] T, NP   1 year ago Class 2 obesity due to excess calories without serious comorbidity with body mass index (BMI) of 36.0 to 36.9 in adult   [facility], [place] T, NP   1 year ago Migraine without status migrainosus, not intractable, unspecified migraine type   [facility], [place], [place]   1 year ago Moderate episode of recurrent major depressive disorder [facility])   [place], [place], [place]       Future Appointments             In 1 week Jon Billings, NP [facility], [place]   In 6 months Philip Aspen, CNM [facility]           Off-Protocol Failed - 08/11/2021  9:03 AM      Failed - Medication not assigned to a protocol, review manually.      Passed - Valid encounter within last 12 months    Recent Outpatient Visits           5 months ago Moderate episode of recurrent major depressive disorder ([place])   [place], Karen, NP   12  months ago Class 2 obesity due to excess calories without serious comorbidity with body mass index (BMI) of 36.0 to 36.9 in adult   [facility], [place] T, NP   1 year ago Class 2 obesity due to excess calories without serious comorbidity with body mass index (BMI) of 36.0 to 36.9 in adult   [place], [place] T, NP   1 year ago Migraine without status migrainosus, not intractable, unspecified migraine type   [facility], [place], [place]   1 year ago Moderate episode of recurrent major depressive disorder [facility])   [facility] [place], [place]       Future Appointments             In 1 week Jon Billings, NP [facility], [place]   In 6 months Philip Aspen, CN[facility]

## 2021-08-20 ENCOUNTER — Telehealth: Payer: Self-pay

## 2021-08-20 ENCOUNTER — Encounter: Payer: Self-pay | Admitting: Nurse Practitioner

## 2021-08-20 ENCOUNTER — Ambulatory Visit (INDEPENDENT_AMBULATORY_CARE_PROVIDER_SITE_OTHER): Payer: BC Managed Care – PPO | Admitting: Nurse Practitioner

## 2021-08-20 ENCOUNTER — Other Ambulatory Visit: Payer: Self-pay

## 2021-08-20 VITALS — BP 100/66 | HR 85 | Temp 97.6°F | Ht 62.6 in | Wt 210.6 lb

## 2021-08-20 DIAGNOSIS — R319 Hematuria, unspecified: Secondary | ICD-10-CM | POA: Diagnosis not present

## 2021-08-20 DIAGNOSIS — G43909 Migraine, unspecified, not intractable, without status migrainosus: Secondary | ICD-10-CM

## 2021-08-20 DIAGNOSIS — F331 Major depressive disorder, recurrent, moderate: Secondary | ICD-10-CM

## 2021-08-20 DIAGNOSIS — Z136 Encounter for screening for cardiovascular disorders: Secondary | ICD-10-CM

## 2021-08-20 DIAGNOSIS — Z0000 Encounter for general adult medical examination without abnormal findings: Secondary | ICD-10-CM

## 2021-08-20 DIAGNOSIS — Z6836 Body mass index (BMI) 36.0-36.9, adult: Secondary | ICD-10-CM

## 2021-08-20 DIAGNOSIS — E538 Deficiency of other specified B group vitamins: Secondary | ICD-10-CM

## 2021-08-20 DIAGNOSIS — F411 Generalized anxiety disorder: Secondary | ICD-10-CM | POA: Diagnosis not present

## 2021-08-20 DIAGNOSIS — E6609 Other obesity due to excess calories: Secondary | ICD-10-CM | POA: Diagnosis not present

## 2021-08-20 LAB — URINALYSIS, ROUTINE W REFLEX MICROSCOPIC
Bilirubin, UA: NEGATIVE
Glucose, UA: NEGATIVE
Ketones, UA: NEGATIVE
Leukocytes,UA: NEGATIVE
Nitrite, UA: NEGATIVE
Specific Gravity, UA: >1.03 — ABNORMAL HIGH (ref 1.005–1.030)
Urobilinogen, Ur: 2 mg/dL — ABNORMAL HIGH (ref 0.2–1.0)
pH, UA: 5.5 (ref 5.0–7.5)

## 2021-08-20 LAB — MICROSCOPIC EXAMINATION: WBC, UA: NONE SEEN /hpf (ref 0–5)

## 2021-08-20 MED ORDER — SAXENDA 18 MG/3ML ~~LOC~~ SOPN: 0.6000 mg | PEN_INJECTOR | Freq: Every day | SUBCUTANEOUS | 0 refills | Status: DC

## 2021-08-20 NOTE — Addendum Note (Signed)
Addended by: Pablo Ledger on: 08/20/2021 04:32 PM   Modules accepted: Orders

## 2021-08-20 NOTE — Assessment & Plan Note (Signed)
Chronic.  Controlled.  Continue with current medication regimen.  Labs ordered today.  Return to clinic in 6 months for reevaluation.  Call sooner if concerns arise.  ? ?

## 2021-08-20 NOTE — Assessment & Plan Note (Addendum)
Chronic. Would like to try Sexenda for weight loss.  Discussed side effects and benefits of medication during visit today.  Discussed the importance of health habits and needing to improve diet and exercise. Follow up in 1 month

## 2021-08-20 NOTE — Progress Notes (Signed)
BP 100/66    Pulse 85    Temp 97.6 F (36.4 C) (Oral)    Ht 5' 2.6" (1.59 m)    Wt 210 lb 9.6 oz (95.5 kg)    LMP  (LMP Unknown)    SpO2 98%    BMI 37.78 kg/m    Subjective:    Patient ID: Mia Thompson, female    DOB: 1975/10/13, 46 y.o.   MRN: [id number]  HPI: Drinda Belgard is a 46 y.o. female presenting on 08/20/2021 for comprehensive medical examination. Current medical complaints include: Weight loss  She currently lives with: Menopausal Symptoms: no  WEIGHT LOSS Patient would like to try [facility] for weight loss.  She has previously gone to the weight loss clinic and lost some weight.  She is having foot and knee pain and feels like it is weight related.   MIGRAINES Patient states her migraines are well controlled with Aimovig.  Denies concerns with the medication and her migraines.   DEPRESSION/ANXIETY Patient feels like her depression and anxiety are also well controlled.  Denies concerns at visit today.   Depression Screen done today and results listed below:  Depression screen [facility] -  2/9 08/20/2021 02/17/2021 11/04/2020 08/12/2020 05/11/2020  Decreased Interest 0 0 3 0 0  Down, Depressed, Hopeless 0 0 1 0 0  PHQ - 2 Score 0 0 4 0 0  Altered sleeping 3 3 3  - 2  Tired, decreased energy 3 3 3  - 2  Change in appetite 3 0 3 - 1  Feeling bad or failure about yourself  0 0 0 - 0  Trouble concentrating 0 0 1 - 0  Moving slowly or fidgety/restless 0 0 0 - 0  Suicidal thoughts 0 0 0 - 0  PHQ-9 Score 9 6 14  - 5  Difficult doing work/chores Not difficult at all Not difficult at all - - Not difficult at all    The patient does not have a history of falls. I did complete a risk assessment for falls. A plan of care for falls was documented.   Past Medical History:  Past Medical History:  Diagnosis Date   Anxiety    Depression    GERD (gastroesophageal reflux disease)    Migraines    Migraines    sees Neuro   Prolapse of female bladder, acquired     Surgical History:  Past  Surgical History:  Procedure Laterality Date   IUD REMOVAL N/A 05/08/2017   Procedure: INTRAUTERINE DEVICE (IUD) REMOVAL;  Surgeon: , MD;  Location: [facility] ORS;  Service: Gynecology;  Laterality: N/A;   LAPAROSCOPIC LYSIS OF ADHESIONS  05/08/2017   Procedure: LAPAROSCOPIC LYSIS OF ADHESIONS;  Surgeon: 07/08/2017, MD;  Location: [facility] ORS;  Service: Gynecology;;   LAPAROSCOPY N/A 05/08/2017   Procedure: LAPAROSCOPY OPERATIVE;  Surgeon: 07/08/2017, MD;  Location: [facility] ORS;  Service: Gynecology;  Laterality: N/A;   THUMB SURGERY Left AGE 76   WISDOM TOOTH EXTRACTION      Medications:  Current Outpatient Medications on File Prior to Visit  Medication Sig   AIMOVIG 70 MG/ML SOAJ Inject 70 mg into the skin every 30 (thirty) days.   ARIPiprazole (ABILIFY) 2 MG tablet Take 1 tablet (2 mg total) by mouth daily.   baclofen (LIORESAL) 10 MG tablet Take 1 tablet (10 mg total) by mouth 2 (two) times daily as needed for muscle spasms.   cetirizine (ZYRTEC) 10 MG tablet Take 10 mg by mouth at bedtime.   DULoxetine (CYMBALTA) 60  MG capsule Take 1 capsule (60 mg total) by mouth daily.   Lactobacillus Probiotic TABS Take 5 Billion Cells by mouth daily.   Levonorgestrel-Ethinyl Estradiol (CAMRESE) 0.15-0.03 &0.01 MG tablet Take 1 tablet by mouth at bedtime.   Multiple Vitamin (MULTI-VITAMINS) TABS Take 1 tablet by mouth daily.    naproxen (NAPROSYN) 500 MG tablet Take 1 tablet (500 mg total) by mouth 2 (two) times daily as needed for headache.   pantoprazole (PROTONIX) 40 MG tablet Take 1 tablet (40 mg total) by mouth daily.   promethazine (PHENERGAN) 25 MG tablet Take 1 tablet (25 mg total) by mouth every 8 (eight) hours as needed for nausea or vomiting.   rizatriptan (MAXALT) 10 MG tablet Take 1 tablet (10 mg total) by mouth as needed for migraine. May repeat in 2 hours if needed   topiramate (TOPAMAX) 200 MG tablet Take 1 tablet (200 mg total) by mouth daily.   Vitamin D, Ergocalciferol,  (DRISDOL) 1.25 MG (50000 UNIT) CAPS capsule Take 1 capsule (50,000 Units total) by mouth every 7 (seven) days.   No current facility-administered medications on file prior to visit.    Allergies:  Allergies  Allergen Reactions   Vantin [Cefpodoxime] Rash    Shortness of breath    Social History:  Social History   Socioeconomic History   Marital status: Married    Spouse name: Not on file   Number of children: Not on file   Years of education: Not on file   Highest education level: Not on file  Occupational History   Occupation: Pharmacy Tech  Tobacco Use   Smoking status: Former   Smokeless tobacco: Never  Building services engineerVaping Use   Vaping Use: Never used  Substance and Sexual Activity   Alcohol use: Not Currently    Comment: occassional   Drug use: No   Sexual activity: Yes    Birth control/protection: Pill  Other Topics Concern   Not on file  Social History Narrative   Not on file   Social Determinants of Health   Financial Resource Strain: Not on file  Food Insecurity: Not on file  Transportation Needs: Not on file  Physical Activity: Not on file  Stress: Not on file  Social Connections: Not on file  Intimate Partner Violence: Not on file   Social History   Tobacco Use  Smoking Status Former  Smokeless Tobacco Never   Social History   Substance and Sexual Activity  Alcohol Use Not Currently   Comment: occassional    Family History:  Family History  Adopted: Yes  Problem Relation Age of Onset   Breast cancer Neg Hx     Past medical history, surgical history, medications, allergies, family history and social history reviewed with patient today and changes made to appropriate areas of the chart.   Review of Systems  Eyes:  Negative for blurred vision and double vision.  Respiratory:  Negative for shortness of breath.   Cardiovascular:  Negative for chest pain, palpitations and leg swelling.  Neurological:  Negative for dizziness and headaches.   Psychiatric/Behavioral:  Negative for depression and suicidal ideas. The patient is not nervous/anxious.   All other ROS negative except what is listed above and in the HPI.      Objective:    BP 100/66    Pulse 85    Temp 97.6 F (36.4 C) (Oral)    Ht 5' 2.6" (1.59 m)    Wt 210 lb 9.6 oz (95.5 kg)    LMP  (  LMP Unknown)    SpO2 98%    BMI 37.78 kg/m   Wt Readings from Last 3 Encounters:  08/20/21 210 lb 9.6 oz (95.5 kg)  02/17/21 199 lb 3.2 oz (90.4 kg)  02/16/21 197 lb 6.4 oz (89.5 kg)    Physical Exam Vitals and nursing note reviewed.  Constitutional:      General: She is awake. She is not in acute distress.    Appearance: She is well-developed. She is obese. She is not ill-appearing.  HENT:     Head: Normocephalic and atraumatic.     Right Ear: Hearing, tympanic membrane, ear canal and external ear normal. No drainage.     Left Ear: Hearing, tympanic membrane, ear canal and external ear normal. No drainage.     Nose: Nose normal.     Right Sinus: No maxillary sinus tenderness or frontal sinus tenderness.     Left Sinus: No maxillary sinus tenderness or frontal sinus tenderness.     Mouth/Throat:     Mouth: Mucous membranes are moist.     Pharynx: Oropharynx is clear. Uvula midline. No pharyngeal swelling, oropharyngeal exudate or posterior oropharyngeal erythema.  Eyes:     General: Lids are normal.        Right eye: No discharge.        Left eye: No discharge.     Extraocular Movements: Extraocular movements intact.     Conjunctiva/sclera: Conjunctivae normal.     Pupils: Pupils are equal, round, and reactive to light.     Visual Fields: Right eye visual fields normal and left eye visual fields normal.  Neck:     Thyroid: No thyromegaly.     Vascular: No carotid bruit.     Trachea: Trachea normal.  Cardiovascular:     Rate and Rhythm: Normal rate and regular rhythm.     Heart sounds: Normal heart sounds. No murmur heard.   No gallop.  Pulmonary:     Effort:  Pulmonary effort is normal. No accessory muscle usage or respiratory distress.     Breath sounds: Normal breath sounds.  Chest:  Breasts:    Right: Normal.     Left: Normal.  Abdominal:     General: Bowel sounds are normal.     Palpations: Abdomen is soft. There is no hepatomegaly or splenomegaly.     Tenderness: There is no abdominal tenderness.  Musculoskeletal:        General: Normal range of motion.     Cervical back: Normal range of motion and neck supple.     Right lower leg: No edema.     Left lower leg: No edema.  Lymphadenopathy:     Head:     Right side of head: No submental, submandibular, tonsillar, preauricular or posterior auricular adenopathy.     Left side of head: No submental, submandibular, tonsillar, preauricular or posterior auricular adenopathy.     Cervical: No cervical adenopathy.     Upper Body:     Right upper body: No supraclavicular, axillary or pectoral adenopathy.     Left upper body: No supraclavicular, axillary or pectoral adenopathy.  Skin:    General: Skin is warm and dry.     Capillary Refill: Capillary refill takes less than 2 seconds.     Findings: No rash.  Neurological:     Mental Status: She is alert and oriented to person, place, and time.     Gait: Gait is intact.     Deep Tendon Reflexes: Reflexes are  normal and symmetric.     Reflex Scores:      Brachioradialis reflexes are 2+ on the right side and 2+ on the left side.      Patellar reflexes are 2+ on the right side and 2+ on the left side. Psychiatric:        Attention and Perception: Attention normal.        Mood and Affect: Mood normal.        Speech: Speech normal.        Behavior: Behavior normal. Behavior is cooperative.        Thought Content: Thought content normal.        Judgment: Judgment normal.    Results for orders placed or performed in visit on 02/16/21  Cytology - PAP  Result Value Ref Range   High risk HPV Positive (A)    HPV 16 Negative    HPV 18 / 45  Negative    Adequacy      Satisfactory for evaluation; transformation zone component PRESENT.   Diagnosis      - Negative for intraepithelial lesion or malignancy (NILM)   Comment Normal Reference Range HPV - Negative    Comment Normal Reference Range HPV 16 18 45 -Negative       Assessment & Plan:   Problem List Items Addressed This Visit       Cardiovascular and Mediastinum   Migraine    Chronic.  Controlled.  Continue with current medication regimen on Aimovig.  Labs ordered today.  Return to clinic in 6 months for reevaluation.  Call sooner if concerns arise.          Other   Anxiety, generalized    Chronic.  Controlled.  Continue with current medication regimen.  Labs ordered today.  Return to clinic in 6 months for reevaluation.  Call sooner if concerns arise.        B12 deficiency    Labs ordered today. Will make recommendations based on lab results.      Relevant Orders   B12   Depression    Chronic.  Controlled.  Continue with current medication regimen.  Labs ordered today.  Return to clinic in 6 months for reevaluation.  Call sooner if concerns arise.        Class 2 obesity due to excess calories without serious comorbidity with body mass index (BMI) of 36.0 to 36.9 in adult    Chronic. Would like to try Sexenda for weight loss.  Discussed side effects and benefits of medication during visit today.  Discussed the importance of health habits and needing to improve diet and exercise. Follow up in 1 month       Relevant Medications   Liraglutide -Weight Management (SAXENDA) 18 MG/3ML SOPN   Other Visit Diagnoses     Annual physical exam    -  Primary   Health mainteneance reviewed during visit today. Labs ordered today. Up to date on PAP and vaccines.   Relevant Orders   CBC with Differential/Platelet   Comprehensive metabolic panel   TSH   Urinalysis, Routine w reflex microscopic   Screening for cardiovascular condition       Relevant Orders   Lipid  panel        Follow up plan: Return in about 1 month (around 09/20/2021) for Weight Managment.   LABORATORY TESTING:  - Pap smear: up to date  IMMUNIZATIONS:   - Tdap: Tetanus vaccination status reviewed: last tetanus booster within 10 years. -  Influenza: Given elsewhere - Pneumovax: NA - Prevnar: NA - COVID: Given elsewhere - HPV: NA - Shingrix vaccine: NA  SCREENING: -Mammogram: Up to date - Colonoscopy: NA - Bone Density: NA -Hearing Test: NA -Spirometry: NA  PATIENT COUNSELING:   Advised to take 1 mg of folate supplement per day if capable of pregnancy.   Sexuality: Discussed sexually transmitted diseases, partner selection, use of condoms, avoidance of unintended pregnancy  and contraceptive alternatives.   Advised to avoid cigarette smoking.  I discussed with the patient that most people either abstain from alcohol or drink within safe limits (<=14/week and <=4 drinks/occasion for males, <=7/weeks and <= 3 drinks/occasion for females) and that the risk for alcohol disorders and other health effects rises proportionally with the number of drinks per week and how often a drinker exceeds daily limits.  Discussed cessation/primary prevention of drug use and availability of treatment for abuse.   Diet: Encouraged to adjust caloric intake to maintain  or achieve ideal body weight, to reduce intake of dietary saturated fat and total fat, to limit sodium intake by avoiding high sodium foods and not adding table salt, and to maintain adequate dietary potassium and calcium preferably from fresh fruits, vegetables, and low-fat dairy products.    stressed the importance of regular exercise  Injury prevention: Discussed safety belts, safety helmets, smoke detector, smoking near bedding or upholstery.   Dental health: Discussed importance of regular tooth brushing, flossing, and dental visits.    NEXT PREVENTATIVE PHYSICAL DUE IN 1 YEAR. Return in about 1 month (around  09/20/2021) for Weight Managment.

## 2021-08-20 NOTE — Assessment & Plan Note (Signed)
Labs ordered today.  Will make recommendations based on lab results. ?

## 2021-08-20 NOTE — Telephone Encounter (Signed)
PA for Saxenda initiated and submitted via Cover My Meds. Key: [id number]

## 2021-08-20 NOTE — Assessment & Plan Note (Signed)
Chronic.  Controlled.  Continue with current medication regimen on Aimovig.  Labs ordered today.  Return to clinic in 6 months for reevaluation.  Call sooner if concerns arise.

## 2021-08-21 LAB — CBC WITH DIFFERENTIAL/PLATELET
Basophils Absolute: 0.1 10*3/uL (ref 0.0–0.2)
Basos: 1 %
EOS (ABSOLUTE): 0.4 10*3/uL (ref 0.0–0.4)
Eos: 5 %
Hematocrit: 42.7 % (ref 34.0–46.6)
Hemoglobin: 14.3 g/dL (ref 11.1–15.9)
Immature Grans (Abs): 0 10*3/uL (ref 0.0–0.1)
Immature Granulocytes: 0 %
Lymphocytes Absolute: 2.4 10*3/uL (ref 0.7–3.1)
Lymphs: 28 %
MCH: 30.6 pg (ref 26.6–33.0)
MCHC: 33.5 g/dL (ref 31.5–35.7)
MCV: 91 fL (ref 79–97)
Monocytes Absolute: 0.4 10*3/uL (ref 0.1–0.9)
Monocytes: 5 %
Neutrophils Absolute: 5.2 10*3/uL (ref 1.4–7.0)
Neutrophils: 61 %
Platelets: 306 10*3/uL (ref 150–450)
RBC: 4.67 x10E6/uL (ref 3.77–5.28)
RDW: 12 % (ref 11.7–15.4)
WBC: 8.6 10*3/uL (ref 3.4–10.8)

## 2021-08-21 LAB — COMPREHENSIVE METABOLIC PANEL
ALT: 13 IU/L (ref 0–32)
AST: 12 IU/L (ref 0–40)
Albumin/Globulin Ratio: 2.2 (ref 1.2–2.2)
Albumin: 4.4 g/dL (ref 3.8–4.8)
Alkaline Phosphatase: 65 IU/L (ref 44–121)
BUN/Creatinine Ratio: 17 (ref 9–23)
BUN: 16 mg/dL (ref 6–24)
Bilirubin Total: <0.2 mg/dL (ref 0.0–1.2)
CO2: 18 mmol/L — ABNORMAL LOW (ref 20–29)
Calcium: 8.6 mg/dL — ABNORMAL LOW (ref 8.7–10.2)
Chloride: 110 mmol/L — ABNORMAL HIGH (ref 96–106)
Creatinine, Ser: 0.93 mg/dL (ref 0.57–1.00)
Globulin, Total: 2 g/dL (ref 1.5–4.5)
Glucose: 94 mg/dL (ref 70–99)
Potassium: 4 mmol/L (ref 3.5–5.2)
Sodium: 144 mmol/L (ref 134–144)
Total Protein: 6.4 g/dL (ref 6.0–8.5)
eGFR: 78 mL/min/{1.73_m2} (ref >59)

## 2021-08-21 LAB — LIPID PANEL
Chol/HDL Ratio: 3.9 ratio (ref 0.0–4.4)
Cholesterol, Total: 159 mg/dL (ref 100–199)
HDL: 41 mg/dL (ref >39)
LDL Chol Calc (NIH): 92 mg/dL (ref 0–99)
Triglycerides: 147 mg/dL (ref 0–149)
VLDL Cholesterol Cal: 26 mg/dL (ref 5–40)

## 2021-08-21 LAB — TSH: TSH: 1.27 u[IU]/mL (ref 0.450–4.500)

## 2021-08-21 LAB — VITAMIN B12: Vitamin B-12: 589 pg/mL (ref 232–1245)

## 2021-08-23 LAB — URINE CULTURE

## 2021-08-23 NOTE — Progress Notes (Signed)
Hi Mia Thompson.  Your lab work looks good. Your urine was abnormal so I sent it for a culture but it didn't grow any bacteria so no need for further treatment. Otherwise, no concerns at this time.  Please let me know if you have any questions.

## 2021-08-24 NOTE — Telephone Encounter (Signed)
PA denied. Patient notified via Mychart

## 2021-09-01 NOTE — Telephone Encounter (Signed)
Copied from [place] [id number]. Topic: General - Call Back - No Documentation >> Sep 01, 2021  4:12 PM Erick Blinks wrote: Reason for CRM: Judson Roch from [employer] called and wants to speak to someone regarding a Prior Auth  Best contact: [phone] She is calling about the appeal form for [employer]. The patient's insurance plan requires a form to be completed by the patient on their website.  Reference key: [facility]

## 2021-09-09 ENCOUNTER — Other Ambulatory Visit: Payer: Self-pay | Admitting: Nurse Practitioner

## 2021-09-09 NOTE — Telephone Encounter (Signed)
Requested medication (s) are due for refill today: yes  Requested medication (s) are on the active medication list: yes  Last refill:  08/22/21 1 ml 0 refills   Future visit scheduled: yes in 2 weeks  Notes to clinic:  medication not assigned to a protocol. Do you want to refill Rx?     Requested Prescriptions  Pending Prescriptions Disp Refills   AIMOVIG 70 MG/ML SOAJ [Pharmacy Med Name: AIMOVIG 70 MG/ML AUTOINJECTOR] 1 mL 0    Sig: Inject 70 mg into the skin every 30 (thirty) days.     Off-Protocol Failed - 09/09/2021 10:38 AM      Failed - Medication not assigned to a protocol, review manually.      Passed - Valid encounter within last 12 months    Recent Outpatient Visits           2 weeks ago Annual physical exam   [facility] Jon Billings, NP   6 months ago Moderate episode of recurrent major depressive disorder ([place])   [place], Karen, NP   1 year ago Class 2 obesity due to excess calories without serious comorbidity with body mass index (BMI) of 36.0 to 36.9 in adult   [facility], [place] T, NP   1 year ago Class 2 obesity due to excess calories without serious comorbidity with body mass index (BMI) of 36.0 to 36.9 in adult   [place], [place] T, NP   1 year ago Migraine without status migrainosus, not intractable, unspecified migraine type   [facility], Lilia Argue, [place]       Future Appointments             In 2 weeks Jon Billings, NP [facility], [place]   In 5 months Philip Aspen, CNM [facility]           Off-Protocol Failed - 09/09/2021 10:38 AM      Failed - Medication not assigned to a protocol, review manually.      Passed - Valid encounter within last 12 months    Recent Outpatient Visits           2 weeks ago Annual physical exam   [facility] Jon Billings, NP   6 months ago Moderate  episode of recurrent major depressive disorder ([place])   [place], Karen, NP   1 year ago Class 2 obesity due to excess calories without serious comorbidity with body mass index (BMI) of 36.0 to 36.9 in adult   [facility], [place] T, NP   1 year ago Class 2 obesity due to excess calories without serious comorbidity with body mass index (BMI) of 36.0 to 36.9 in adult   [place], [place] T, NP   1 year ago Migraine without status migrainosus, not intractable, unspecified migraine type   [facility], Lilia Argue, [place]       Future Appointments             In 2 weeks Jon Billings, NP [facility], [facility]   In 5 months Philip Aspen, CNM [facility]            Signed Prescriptions Disp Refills   naproxen (NAPROSYN) 500 MG tablet 60 tablet 0    Sig: Take 1 tablet (500 mg total) by mouth 2 (two) times daily as needed for headache.     Analgesics:  NSAIDS Failed - 09/09/2021 10:38 AM      Failed - Manual Review: Labs are only required if the patient has taken medication for more than 8 weeks.      Passed - Cr in normal range and within 360 days    Creatinine, Ser  Date Value Ref Range Status  08/20/2021 0.93 0.57 - 1.00 mg/dL Final          Passed - HGB in normal range and within 360 days    Hemoglobin  Date Value Ref Range Status  08/20/2021 14.3 11.1 - 15.9 g/dL Final          Passed - PLT in normal range and within 360 days    Platelets  Date Value Ref Range Status  08/20/2021 306 150 - 450 x10E3/uL Final          Passed - HCT in normal range and within 360 days    Hematocrit  Date Value Ref Range Status  08/20/2021 42.7 34.0 - 46.6 % Final          Passed - eGFR is 30 or above and within 360 days    GFR calc Af Amer  Date Value Ref Range Status  05/11/2020 80 >59 mL/min/1.73 Final    Comment:    **[facility] currently reports eGFR in compliance  with the current**   recommendations of the [employer]. [facility] will   update reporting as new guidelines are published from the NKF-ASN   Task force.    GFR calc non Af Amer  Date Value Ref Range Status  05/11/2020 69 >59 mL/min/1.73 Final   eGFR  Date Value Ref Range Status  08/20/2021 78 >59 mL/min/1.73 Final          Passed - Patient is not pregnant      Passed - Valid encounter within last 12 months    Recent Outpatient Visits           2 weeks ago Annual physical exam   [facility] Jon Billings, NP   6 months ago Moderate episode of recurrent major depressive disorder ([place])   [place], Karen, NP   1 year ago Class 2 obesity due to excess calories without serious comorbidity with body mass index (BMI) of 36.0 to 36.9 in adult   [facility], [place] T, NP   1 year ago Class 2 obesity due to excess calories without serious comorbidity with body mass index (BMI) of 36.0 to 36.9 in adult   [place], [place] T, NP   1 year ago Migraine without status migrainosus, not intractable, unspecified migraine type   [facility], Lilia Argue, [place]       Future Appointments             In 2 weeks Jon Billings, NP [facility], [place]   In 5 months Philip Aspen, CNM [facility]

## 2021-09-09 NOTE — Telephone Encounter (Signed)
Requested Prescriptions  Pending Prescriptions Disp Refills   AIMOVIG 70 MG/ML SOAJ [Pharmacy Med Name: AIMOVIG 70 MG/ML AUTOINJECTOR] 1 mL 0    Sig: Inject 70 mg into the skin every 30 (thirty) days.     Off-Protocol Failed - 09/09/2021 10:38 AM      Failed - Medication not assigned to a protocol, review manually.      Passed - Valid encounter within last 12 months    Recent Outpatient Visits          2 weeks ago Annual physical exam   [facility] Jon Billings, NP   6 months ago Moderate episode of recurrent major depressive disorder ([place])   [place], Karen, NP   1 year ago Class 2 obesity due to excess calories without serious comorbidity with body mass index (BMI) of 36.0 to 36.9 in adult   [facility], [place] T, NP   1 year ago Class 2 obesity due to excess calories without serious comorbidity with body mass index (BMI) of 36.0 to 36.9 in adult   [place], [place] T, NP   1 year ago Migraine without status migrainosus, not intractable, unspecified migraine type   [facility], Lilia Argue, [place]      Future Appointments            In 2 weeks Jon Billings, NP [facility], [place]   In 5 months Philip Aspen, CNM [facility] [place]          Off-Protocol Failed - 09/09/2021 10:38 AM      Failed - Medication not assigned to a protocol, review manually.      Passed - Valid encounter within last 12 months    Recent Outpatient Visits          2 weeks ago Annual physical exam   [facility] Jon Billings, NP   6 months ago Moderate episode of recurrent major depressive disorder ([place])   [place], Karen, NP   1 year ago Class 2 obesity due to excess calories without serious comorbidity with body mass index (BMI) of 36.0 to 36.9 in adult   [facility], [place] T, NP   1 year ago Class 2  obesity due to excess calories without serious comorbidity with body mass index (BMI) of 36.0 to 36.9 in adult   [facility], [place] T, NP   1 year ago Migraine without status migrainosus, not intractable, unspecified migraine type   [facility], Lilia Argue, [place]      Future Appointments            In 2 weeks Jon Billings, NP [facility], [place]   In 5 months Philip Aspen, CNM [facility] [place]            naproxen (NAPROSYN) 500 MG tablet [Pharmacy Med Name: NAPROXEN 500 MG TABLET] 60 tablet 0    Sig: Take 1 tablet (500 mg total) by mouth 2 (two) times daily as needed for headache.     Analgesics:  NSAIDS Failed - 09/09/2021 10:38 AM      Failed - Manual Review: Labs are only required if the patient has taken medication for more than 8 weeks.      Passed - Cr in normal range and within 360 days    Creatinine, Ser  Date Value Ref Range Status  08/20/2021 0.93 0.57 - 1.00 mg/dL Final  Passed - HGB in normal range and within 360 days    Hemoglobin  Date Value Ref Range Status  08/20/2021 14.3 11.1 - 15.9 g/dL Final         Passed - PLT in normal range and within 360 days    Platelets  Date Value Ref Range Status  08/20/2021 306 150 - 450 x10E3/uL Final         Passed - HCT in normal range and within 360 days    Hematocrit  Date Value Ref Range Status  08/20/2021 42.7 34.0 - 46.6 % Final         Passed - eGFR is 30 or above and within 360 days    GFR calc Af Amer  Date Value Ref Range Status  05/11/2020 80 >59 mL/min/1.73 Final    Comment:    **[facility] currently reports eGFR in compliance with the current**   recommendations of the [employer]. [facility] will   update reporting as new guidelines are published from the NKF-ASN   Task force.    GFR calc non Af Amer  Date Value Ref Range Status  05/11/2020 69 >59 mL/min/1.73 Final   eGFR  Date Value Ref Range Status   08/20/2021 78 >59 mL/min/1.73 Final         Passed - Patient is not pregnant      Passed - Valid encounter within last 12 months    Recent Outpatient Visits          2 weeks ago Annual physical exam   [facility] Jon Billings, NP   6 months ago Moderate episode of recurrent major depressive disorder ([place])   [place], Karen, NP   1 year ago Class 2 obesity due to excess calories without serious comorbidity with body mass index (BMI) of 36.0 to 36.9 in adult   [facility], [place] T, NP   1 year ago Class 2 obesity due to excess calories without serious comorbidity with body mass index (BMI) of 36.0 to 36.9 in adult   [place], [place] T, NP   1 year ago Migraine without status migrainosus, not intractable, unspecified migraine type   [facility], Lilia Argue, [place]      Future Appointments            In 2 weeks Jon Billings, NP [facility], [place]   In 5 months Philip Aspen, CNM [facility]

## 2021-09-24 ENCOUNTER — Ambulatory Visit: Payer: BC Managed Care – PPO | Admitting: Nurse Practitioner

## 2021-10-08 ENCOUNTER — Other Ambulatory Visit: Payer: Self-pay | Admitting: Nurse Practitioner

## 2021-10-08 NOTE — Telephone Encounter (Signed)
Requested medication (s) are due for refill today -expired Rx  Requested medication (s) are on the active medication list - yes  Future visit scheduled -yes  Last refill: 08/12/20 #90 4RF- on all  Notes to clinic: Request RF: expired Rx, non delegated Rx  Requested Prescriptions  Pending Prescriptions Disp Refills   ARIPiprazole (ABILIFY) 2 MG tablet [Pharmacy Med Name: ARIPIPRAZOLE 2 MG TABLET] 90 tablet 0    Sig: Take 1 tablet (2 mg total) by mouth daily.     Not Delegated - Psychiatry:  Antipsychotics - Second Generation (Atypical) - aripiprazole Failed - 10/08/2021 10:19 AM      Failed - This refill cannot be delegated      Failed - Lipid Panel in normal range within the last 12 months    Cholesterol, Total  Date Value Ref Range Status  08/20/2021 159 100 - 199 mg/dL Final   LDL Chol Calc (NIH)  Date Value Ref Range Status  08/20/2021 92 0 - 99 mg/dL Final   HDL  Date Value Ref Range Status  08/20/2021 41 >39 mg/dL Final   Triglycerides  Date Value Ref Range Status  08/20/2021 147 0 - 149 mg/dL Final         Passed - TSH in normal range and within 360 days    TSH  Date Value Ref Range Status  08/20/2021 1.270 0.450 - 4.500 uIU/mL Final          Passed - Completed PHQ-2 or PHQ-9 in the last 360 days      Passed - Last BP in normal range    BP Readings from Last 1 Encounters:  08/20/21 100/66          Passed - Last Heart Rate in normal range    Pulse Readings from Last 1 Encounters:  08/20/21 85          Passed - Valid encounter within last 6 months    Recent Outpatient Visits           1 month ago Annual physical exam   [facility] Jon Billings, NP   7 months ago Moderate episode of recurrent major depressive disorder ([place])   [place], Karen, NP   1 year ago Class 2 obesity due to excess calories without serious comorbidity with body mass index (BMI) of 36.0 to 36.9 in adult   [facility], [place] T, NP   1 year ago Class 2 obesity due to excess calories without serious comorbidity with body mass index (BMI) of 36.0 to 36.9 in adult   [place], [place] T, NP   1 year ago Migraine without status migrainosus, not intractable, unspecified migraine type   [facility], Lilia Argue, [place]       Future Appointments             In 4 months Philip Aspen, CNM [facility] - CBC within normal limits and completed in the last 12 months    WBC  Date Value Ref Range Status  08/20/2021 8.6 3.4 - 10.8 x10E3/uL Final   RBC  Date Value Ref Range Status  08/20/2021 4.67 3.77 - 5.28 x10E6/uL Final   Hemoglobin  Date Value Ref Range Status  08/20/2021 14.3 11.1 - 15.9 g/dL Final   Hematocrit  Date Value Ref Range Status  08/20/2021 42.7 34.0 - 46.6 % Final  MCHC  Date Value Ref Range Status  08/20/2021 33.5 31.5 - 35.7 g/dL Final   [facility]  Date Value Ref Range Status  08/20/2021 30.6 26.6 - 33.0 pg Final   MCV  Date Value Ref Range Status  08/20/2021 91 79 - 97 fL Final   No results found for: PLTCOUNTKUC, LABPLAT, POCPLA RDW  Date Value Ref Range Status  08/20/2021 12.0 11.7 - 15.4 % Final         Passed - CMP within normal limits and completed in the last 12 months    Albumin  Date Value Ref Range Status  08/20/2021 4.4 3.8 - 4.8 g/dL Final   Alkaline Phosphatase  Date Value Ref Range Status  08/20/2021 65 44 - 121 IU/L Final   ALT  Date Value Ref Range Status  08/20/2021 13 0 - 32 IU/L Final   AST  Date Value Ref Range Status  08/20/2021 12 0 - 40 IU/L Final   BUN  Date Value Ref Range Status  08/20/2021 16 6 - 24 mg/dL Final   Calcium  Date Value Ref Range Status  08/20/2021 8.6 (L) 8.7 - 10.2 mg/dL Final   CO2  Date Value Ref Range Status  08/20/2021 18 (L) 20 - 29 mmol/L Final   Creatinine, Ser  Date Value Ref Range Status  08/20/2021 0.93 0.57 - 1.00  mg/dL Final   Glucose  Date Value Ref Range Status  08/20/2021 94 70 - 99 mg/dL Final   Potassium  Date Value Ref Range Status  08/20/2021 4.0 3.5 - 5.2 mmol/L Final   Sodium  Date Value Ref Range Status  08/20/2021 144 134 - 144 mmol/L Final   Bilirubin Total  Date Value Ref Range Status  08/20/2021 <0.2 0.0 - 1.2 mg/dL Final   Protein,UA  Date Value Ref Range Status  08/20/2021 1+ (A) Negative/Trace Final   Total Protein  Date Value Ref Range Status  08/20/2021 6.4 6.0 - 8.5 g/dL Final   GFR calc Af Amer  Date Value Ref Range Status  05/11/2020 80 >59 mL/min/1.73 Final    Comment:    **[facility] currently reports eGFR in compliance with the current**   recommendations of the [employer]. [facility] will   update reporting as new guidelines are published from the NKF-ASN   Task force.    eGFR  Date Value Ref Range Status  08/20/2021 78 >59 mL/min/1.73 Final   GFR calc non Af Amer  Date Value Ref Range Status  05/11/2020 69 >59 mL/min/1.73 Final          topiramate (TOPAMAX) 200 MG tablet [Pharmacy Med Name: TOPIRAMATE 200 MG TABLET] 90 tablet 0    Sig: Take 1 tablet (200 mg total) by mouth daily.     Neurology: Anticonvulsants - topiramate & zonisamide Failed - 10/08/2021 10:19 AM      Failed - CO2 in normal range and within 360 days    CO2  Date Value Ref Range Status  08/20/2021 18 (L) 20 - 29 mmol/L Final          Passed - Cr in normal range and within 360 days    Creatinine, Ser  Date Value Ref Range Status  08/20/2021 0.93 0.57 - 1.00 mg/dL Final          Passed - ALT in normal range and within 360 days    ALT  Date Value Ref Range Status  08/20/2021 13 0 - 32 IU/L Final  Passed - AST in normal range and within 360 days    AST  Date Value Ref Range Status  08/20/2021 12 0 - 40 IU/L Final          Passed - Completed PHQ-2 or PHQ-9 in the last 360 days      Passed - Valid encounter within last 12 months    Recent  Outpatient Visits           1 month ago Annual physical exam   [facility] Jon Billings, NP   7 months ago Moderate episode of recurrent major depressive disorder ([place])   [place], Karen, NP   1 year ago Class 2 obesity due to excess calories without serious comorbidity with body mass index (BMI) of 36.0 to 36.9 in adult   [facility], [place] T, NP   1 year ago Class 2 obesity due to excess calories without serious comorbidity with body mass index (BMI) of 36.0 to 36.9 in adult   [place], [place] T, NP   1 year ago Migraine without status migrainosus, not intractable, unspecified migraine type   [facility], Lilia Argue, [place]       Future Appointments             In 4 months Philip Aspen, CNM [facility]             DULoxetine (CYMBALTA) 60 MG capsule [Pharmacy Med Name: DULOXETINE HCL DR 60 MG CAP] 90 capsule 0    Sig: Take 1 capsule (60 mg total) by mouth daily.     Psychiatry: Antidepressants - SNRI - duloxetine Passed - 10/08/2021 10:19 AM      Passed - Cr in normal range and within 360 days    Creatinine, Ser  Date Value Ref Range Status  08/20/2021 0.93 0.57 - 1.00 mg/dL Final          Passed - eGFR is 30 or above and within 360 days    GFR calc Af Amer  Date Value Ref Range Status  05/11/2020 80 >59 mL/min/1.73 Final    Comment:    **[facility] currently reports eGFR in compliance with the current**   recommendations of the [employer]. [facility] will   update reporting as new guidelines are published from the NKF-ASN   Task force.    GFR calc non Af Amer  Date Value Ref Range Status  05/11/2020 69 >59 mL/min/1.73 Final   eGFR  Date Value Ref Range Status  08/20/2021 78 >59 mL/min/1.73 Final          Passed - Completed PHQ-2 or PHQ-9 in the last 360 days      Passed - Last BP in normal range    BP Readings from Last  1 Encounters:  08/20/21 100/66          Passed - Valid encounter within last 6 months    Recent Outpatient Visits           1 month ago Annual physical exam   [facility] Jon Billings, NP   7 months ago Moderate episode of recurrent major depressive disorder ([place])   [facility] Jon Billings, NP   1 year ago Class 2 obesity due to excess calories without serious comorbidity with body mass index (BMI) of 36.0 to 36.9 in adult   [place], [place] T, NP   1 year ago Class 2 obesity due to  excess calories without serious comorbidity with body mass index (BMI) of 36.0 to 36.9 in adult   [place], [place] T, NP   1 year ago Migraine without status migrainosus, not intractable, unspecified migraine type   [facility], Lilia Argue, [place]       Future Appointments             In 4 months Philip Aspen, CNM [facility]               Requested Prescriptions  Pending Prescriptions Disp Refills   ARIPiprazole (ABILIFY) 2 MG tablet [Pharmacy Med Name: ARIPIPRAZOLE 2 MG TABLET] 90 tablet 0    Sig: Take 1 tablet (2 mg total) by mouth daily.     Not Delegated - Psychiatry:  Antipsychotics - Second Generation (Atypical) - aripiprazole Failed - 10/08/2021 10:19 AM      Failed - This refill cannot be delegated      Failed - Lipid Panel in normal range within the last 12 months    Cholesterol, Total  Date Value Ref Range Status  08/20/2021 159 100 - 199 mg/dL Final   LDL Chol Calc (NIH)  Date Value Ref Range Status  08/20/2021 92 0 - 99 mg/dL Final   HDL  Date Value Ref Range Status  08/20/2021 41 >39 mg/dL Final   Triglycerides  Date Value Ref Range Status  08/20/2021 147 0 - 149 mg/dL Final         Passed - TSH in normal range and within 360 days    TSH  Date Value Ref Range Status  08/20/2021 1.270 0.450 - 4.500 uIU/mL Final          Passed - Completed  PHQ-2 or PHQ-9 in the last 360 days      Passed - Last BP in normal range    BP Readings from Last 1 Encounters:  08/20/21 100/66          Passed - Last Heart Rate in normal range    Pulse Readings from Last 1 Encounters:  08/20/21 85          Passed - Valid encounter within last 6 months    Recent Outpatient Visits           1 month ago Annual physical exam   [facility] Jon Billings, NP   7 months ago Moderate episode of recurrent major depressive disorder ([place])   [place] Jon Billings, NP   1 year ago Class 2 obesity due to excess calories without serious comorbidity with body mass index (BMI) of 36.0 to 36.9 in adult   [facility], [place] T, NP   1 year ago Class 2 obesity due to excess calories without serious comorbidity with body mass index (BMI) of 36.0 to 36.9 in adult   [place], [place] T, NP   1 year ago Migraine without status migrainosus, not intractable, unspecified migraine type   [facility], Lilia Argue, [place]       Future Appointments             In 4 months Philip Aspen, CNM [facility] [place] - CBC within normal limits and completed in the last 12 months    WBC  Date Value Ref Range Status  08/20/2021 8.6 3.4 - 10.8 x10E3/uL Final   RBC  Date Value Ref Range Status  08/20/2021 4.67 3.77 - 5.28 x10E6/uL Final   Hemoglobin  Date Value Ref Range Status  08/20/2021 14.3 11.1 - 15.9 g/dL Final   Hematocrit  Date Value Ref Range Status  08/20/2021 42.7 34.0 - 46.6 % Final   MCHC  Date Value Ref Range Status  08/20/2021 33.5 31.5 - 35.7 g/dL Final   [facility]  Date Value Ref Range Status  08/20/2021 30.6 26.6 - 33.0 pg Final   MCV  Date Value Ref Range Status  08/20/2021 91 79 - 97 fL Final   No results found for: PLTCOUNTKUC, LABPLAT, POCPLA RDW  Date Value Ref Range Status  08/20/2021 12.0 11.7 - 15.4 % Final          Passed - CMP within normal limits and completed in the last 12 months    Albumin  Date Value Ref Range Status  08/20/2021 4.4 3.8 - 4.8 g/dL Final   Alkaline Phosphatase  Date Value Ref Range Status  08/20/2021 65 44 - 121 IU/L Final   ALT  Date Value Ref Range Status  08/20/2021 13 0 - 32 IU/L Final   AST  Date Value Ref Range Status  08/20/2021 12 0 - 40 IU/L Final   BUN  Date Value Ref Range Status  08/20/2021 16 6 - 24 mg/dL Final   Calcium  Date Value Ref Range Status  08/20/2021 8.6 (L) 8.7 - 10.2 mg/dL Final   CO2  Date Value Ref Range Status  08/20/2021 18 (L) 20 - 29 mmol/L Final   Creatinine, Ser  Date Value Ref Range Status  08/20/2021 0.93 0.57 - 1.00 mg/dL Final   Glucose  Date Value Ref Range Status  08/20/2021 94 70 - 99 mg/dL Final   Potassium  Date Value Ref Range Status  08/20/2021 4.0 3.5 - 5.2 mmol/L Final   Sodium  Date Value Ref Range Status  08/20/2021 144 134 - 144 mmol/L Final   Bilirubin Total  Date Value Ref Range Status  08/20/2021 <0.2 0.0 - 1.2 mg/dL Final   Protein,UA  Date Value Ref Range Status  08/20/2021 1+ (A) Negative/Trace Final   Total Protein  Date Value Ref Range Status  08/20/2021 6.4 6.0 - 8.5 g/dL Final   GFR calc Af Amer  Date Value Ref Range Status  05/11/2020 80 >59 mL/min/1.73 Final    Comment:    **[facility] currently reports eGFR in compliance with the current**   recommendations of the [employer]. [facility] will   update reporting as new guidelines are published from the NKF-ASN   Task force.    eGFR  Date Value Ref Range Status  08/20/2021 78 >59 mL/min/1.73 Final   GFR calc non Af Amer  Date Value Ref Range Status  05/11/2020 69 >59 mL/min/1.73 Final          topiramate (TOPAMAX) 200 MG tablet [Pharmacy Med Name: TOPIRAMATE 200 MG TABLET] 90 tablet 0    Sig: Take 1 tablet (200 mg total) by mouth daily.     Neurology: Anticonvulsants - topiramate & zonisamide  Failed - 10/08/2021 10:19 AM      Failed - CO2 in normal range and within 360 days    CO2  Date Value Ref Range Status  08/20/2021 18 (L) 20 - 29 mmol/L Final          Passed - Cr in normal range and within 360 days    Creatinine, Ser  Date Value Ref Range Status  08/20/2021 0.93 0.57 - 1.00 mg/dL  Final          Passed - ALT in normal range and within 360 days    ALT  Date Value Ref Range Status  08/20/2021 13 0 - 32 IU/L Final          Passed - AST in normal range and within 360 days    AST  Date Value Ref Range Status  08/20/2021 12 0 - 40 IU/L Final          Passed - Completed PHQ-2 or PHQ-9 in the last 360 days      Passed - Valid encounter within last 12 months    Recent Outpatient Visits           1 month ago Annual physical exam   [facility] Jon Billings, NP   7 months ago Moderate episode of recurrent major depressive disorder ([place])   [place], Karen, NP   1 year ago Class 2 obesity due to excess calories without serious comorbidity with body mass index (BMI) of 36.0 to 36.9 in adult   [facility], [place] T, NP   1 year ago Class 2 obesity due to excess calories without serious comorbidity with body mass index (BMI) of 36.0 to 36.9 in adult   [place], [place] T, NP   1 year ago Migraine without status migrainosus, not intractable, unspecified migraine type   [facility], Lilia Argue, [place]       Future Appointments             In 4 months Philip Aspen, CNM [facility] [place]             DULoxetine (CYMBALTA) 60 MG capsule [Pharmacy Med Name: DULOXETINE HCL DR 60 MG CAP] 90 capsule 0    Sig: Take 1 capsule (60 mg total) by mouth daily.     Psychiatry: Antidepressants - SNRI - duloxetine Passed - 10/08/2021 10:19 AM      Passed - Cr in normal range and within 360 days    Creatinine, Ser  Date Value Ref Range Status  08/20/2021 0.93  0.57 - 1.00 mg/dL Final          Passed - eGFR is 30 or above and within 360 days    GFR calc Af Amer  Date Value Ref Range Status  05/11/2020 80 >59 mL/min/1.73 Final    Comment:    **[facility] currently reports eGFR in compliance with the current**   recommendations of the [employer]. [facility] will   update reporting as new guidelines are published from the NKF-ASN   Task force.    GFR calc non Af Amer  Date Value Ref Range Status  05/11/2020 69 >59 mL/min/1.73 Final   eGFR  Date Value Ref Range Status  08/20/2021 78 >59 mL/min/1.73 Final          Passed - Completed PHQ-2 or PHQ-9 in the last 360 days      Passed - Last BP in normal range    BP Readings from Last 1 Encounters:  08/20/21 100/66          Passed - Valid encounter within last 6 months    Recent Outpatient Visits           1 month ago Annual physical exam   [facility] Jon Billings, NP   7 months ago Moderate episode of recurrent major depressive disorder [facility])   [facility] [place], [place] Glad,  NP   1 year ago Class 2 obesity due to excess calories without serious comorbidity with body mass index (BMI) of 36.0 to 36.9 in adult   [facility], [place] T, NP   1 year ago Class 2 obesity due to excess calories without serious comorbidity with body mass index (BMI) of 36.0 to 36.9 in adult   [place], [place] T, NP   1 year ago Migraine without status migrainosus, not intractable, unspecified migraine type   [facility], Lilia Argue, [place]       Future Appointments             In 4 months Philip Aspen, CNM [facility]

## 2021-11-12 ENCOUNTER — Telehealth: Payer: Self-pay | Admitting: Nurse Practitioner

## 2021-11-12 DIAGNOSIS — E559 Vitamin D deficiency, unspecified: Secondary | ICD-10-CM

## 2021-11-12 MED ORDER — VITAMIN D (ERGOCALCIFEROL) 1.25 MG (50000 UNIT) PO CAPS: 50000.0000 [IU] | ORAL_CAPSULE | ORAL | 1 refills | Status: DC

## 2021-11-12 NOTE — Telephone Encounter (Signed)
Medication request came in for Vitamin D.  Sent to the pharmacy for patient. ?

## 2021-11-16 ENCOUNTER — Telehealth: Payer: Self-pay

## 2021-11-16 NOTE — Telephone Encounter (Signed)
PA for Aimovig initiated and submitted via Cover My Meds. Key: [id number] ?

## 2021-11-17 NOTE — Telephone Encounter (Signed)
PA approved. Patient notified via Mychart message. ?

## 2021-12-02 ENCOUNTER — Other Ambulatory Visit: Payer: Self-pay | Admitting: Nurse Practitioner

## 2021-12-03 NOTE — Telephone Encounter (Signed)
Requested Prescriptions  ?Pending Prescriptions Disp Refills  ?? naproxen (NAPROSYN) 500 MG tablet [Pharmacy Med Name: NAPROXEN 500 MG TABLET] 60 tablet 0  ?  Sig: Take 1 tablet (500 mg total) by mouth 2 (two) times daily as needed for headache.  ?  ? Analgesics:  NSAIDS Failed - 12/02/2021  2:09 PM  ?  ?  Failed - Manual Review: Labs are only required if the patient has taken medication for more than 8 weeks.  ?  ?  Passed - Cr in normal range and within 360 days  ?  Creatinine, Ser  ?Date Value Ref Range Status  ?08/20/2021 0.93 0.57 - 1.00 mg/dL Final  ?   ?  ?  Passed - HGB in normal range and within 360 days  ?  Hemoglobin  ?Date Value Ref Range Status  ?08/20/2021 14.3 11.1 - 15.9 g/dL Final  ?   ?  ?  Passed - PLT in normal range and within 360 days  ?  Platelets  ?Date Value Ref Range Status  ?08/20/2021 306 150 - 450 x10E3/uL Final  ?   ?  ?  Passed - HCT in normal range and within 360 days  ?  Hematocrit  ?Date Value Ref Range Status  ?08/20/2021 42.7 34.0 - 46.6 % Final  ?   ?  ?  Passed - eGFR is 30 or above and within 360 days  ?  GFR calc Af Amer  ?Date Value Ref Range Status  ?05/11/2020 80 >59 mL/min/1.73 Final  ?  Comment:  ?  **[facility] currently reports eGFR in compliance with the current** ?  recommendations of the [employer]. [facility] will ?  update reporting as new guidelines are published from the NKF-ASN ?  Task force. ?  ? ?GFR calc non Af Amer  ?Date Value Ref Range Status  ?05/11/2020 69 >59 mL/min/1.73 Final  ? ?eGFR  ?Date Value Ref Range Status  ?08/20/2021 78 >59 mL/min/1.73 Final  ?   ?  ?  Passed - Patient is not pregnant  ?  ?  Passed - Valid encounter within last 12 months  ?  Recent Outpatient Visits   ?      ? 3 months ago Annual physical exam  ? [place], NP  ? 9 months ago Moderate episode of recurrent major depressive disorder ([place])  ? Vivian, NP  ? 1 year ago Class 2 obesity due to excess  calories without serious comorbidity with body mass index (BMI) of 36.0 to 36.9 in adult  ? [place], [place] T, NP  ? 1 year ago Class 2 obesity due to excess calories without serious comorbidity with body mass index (BMI) of 36.0 to 36.9 in adult  ? [place], [place] T, NP  ? 1 year ago Migraine without status migrainosus, not intractable, unspecified migraine type  ? [place], [place], [place]  ?  ?  ?Future Appointments   ?        ? In 2 months Philip Aspen, CN[facility] [place]  ?  ? ?  ?  ?  ? ?

## 2021-12-13 ENCOUNTER — Telehealth: Payer: Self-pay

## 2021-12-13 MED ORDER — RIZATRIPTAN BENZOATE 10 MG PO TABS: 10.0000 mg | ORAL_TABLET | ORAL | 2 refills | Status: DC | PRN

## 2021-12-13 NOTE — Telephone Encounter (Signed)
Medication sent to the pharmacy.

## 2021-12-13 NOTE — Addendum Note (Signed)
Addended by: Larae Grooms on: 12/13/2021 12:04 PM ? ? Modules accepted: Orders ? ?

## 2021-12-13 NOTE — Telephone Encounter (Signed)
Medication refill request for Maxalt 10 MG sent by [facility] court [facility]. Last filled 08/26/2019 with 2 refills. No OV scheduled. for Please advise ?

## 2022-01-17 ENCOUNTER — Other Ambulatory Visit: Payer: Self-pay | Admitting: Nurse Practitioner

## 2022-01-18 ENCOUNTER — Other Ambulatory Visit: Payer: Self-pay

## 2022-01-18 NOTE — Telephone Encounter (Signed)
Requested medication (s) are due for refill today - yes  Requested medication (s) are on the active medication list -yes  Future visit scheduled -no  Last refill: 09/09/21 49ml 3RF  Notes to clinic: medication not assigned protocol  Requested Prescriptions  Pending Prescriptions Disp Refills   AIMOVIG 70 MG/ML SOAJ [Pharmacy Med Name: AIMOVIG 70 MG/ML AUTOINJECTOR] 1 mL 0    Sig: Inject 70 mg into the skin every 30 (thirty) days.     Off-Protocol Failed - 01/17/2022 11:02 AM      Failed - Medication not assigned to a protocol, review manually.      Passed - Valid encounter within last 12 months    Recent Outpatient Visits           5 months ago Annual physical exam   [facility] Larae Grooms, NP   11 months ago Moderate episode of recurrent major depressive disorder (HCC)   [facility] Larae Grooms, NP   1 year ago Class 2 obesity due to excess calories without serious comorbidity with body mass index (BMI) of 36.0 to 36.9 in adult   [facility], [place] T, NP   1 year ago Class 2 obesity due to excess calories without serious comorbidity with body mass index (BMI) of 36.0 to 36.9 in adult   [facility] [place], [place] T, NP   2 years ago Migraine without status migrainosus, not intractable, unspecified migraine type   [facility], [name], [place]       Future Appointments             In 1 month Doreene Burke, CNM [facility]           Off-Protocol Failed - 01/17/2022 11:02 AM      Failed - Medication not assigned to a protocol, review manually.      Passed - Valid encounter within last 12 months    Recent Outpatient Visits           5 months ago Annual physical exam   [facility] Larae Grooms, NP   11 months ago Moderate episode of recurrent major depressive disorder (HCC)   [facility] Larae Grooms, NP   1 year ago Class 2  obesity due to excess calories without serious comorbidity with body mass index (BMI) of 36.0 to 36.9 in adult   [facility], [place] T, NP   1 year ago Class 2 obesity due to excess calories without serious comorbidity with body mass index (BMI) of 36.0 to 36.9 in adult   [facility] [place], [place] T, NP   2 years ago Migraine without status migrainosus, not intractable, unspecified migraine type   [facility], [name], [place]       Future Appointments             In 1 month Doreene Burke, CNM [facility]               Requested Prescriptions  Pending Prescriptions Disp Refills   AIMOVIG 70 MG/ML SOAJ [Pharmacy Med Name: AIMOVIG 70 MG/ML AUTOINJECTOR] 1 mL 0    Sig: Inject 70 mg into the skin every 30 (thirty) days.     Off-Protocol Failed - 01/17/2022 11:02 AM      Failed - Medication not assigned to a protocol, review manually.      Passed - Valid encounter within last 12 months    Recent  Outpatient Visits           5 months ago Annual physical exam   [facility] Larae Grooms, NP   11 months ago Moderate episode of recurrent major depressive disorder (HCC)   [facility] Larae Grooms, NP   1 year ago Class 2 obesity due to excess calories without serious comorbidity with body mass index (BMI) of 36.0 to 36.9 in adult   [facility], [place] T, NP   1 year ago Class 2 obesity due to excess calories without serious comorbidity with body mass index (BMI) of 36.0 to 36.9 in adult   [facility] [place], [place] T, NP   2 years ago Migraine without status migrainosus, not intractable, unspecified migraine type   [facility], [name], [place]       Future Appointments             In 1 month Doreene Burke, CNM [facility]           Off-Protocol Failed - 01/17/2022 11:02 AM      Failed -  Medication not assigned to a protocol, review manually.      Passed - Valid encounter within last 12 months    Recent Outpatient Visits           5 months ago Annual physical exam   [facility] Larae Grooms, NP   11 months ago Moderate episode of recurrent major depressive disorder (HCC)   [facility] Larae Grooms, NP   1 year ago Class 2 obesity due to excess calories without serious comorbidity with body mass index (BMI) of 36.0 to 36.9 in adult   [facility], [place] T, NP   1 year ago Class 2 obesity due to excess calories without serious comorbidity with body mass index (BMI) of 36.0 to 36.9 in adult   [facility] [place], [place] T, NP   2 years ago Migraine without status migrainosus, not intractable, unspecified migraine type   [facility], [name], [place]       Future Appointments             In 1 month Doreene Burke, CNM [facility]

## 2022-01-18 NOTE — Telephone Encounter (Signed)
Requested medication (s) are due for refill today: Yes  Requested medication (s) are on the active medication list: Yes  Last refill:  09/09/21  Future visit scheduled: No  Notes to clinic:  No protocol.    Requested Prescriptions  Pending Prescriptions Disp Refills   AIMOVIG 70 MG/ML SOAJ [Pharmacy Med Name: AIMOVIG 70 MG/ML AUTOINJECTOR] 1 mL 0    Sig: Inject 70 mg into the skin every 30 (thirty) days.     Off-Protocol Failed - 01/17/2022 11:02 AM      Failed - Medication not assigned to a protocol, review manually.      Passed - Valid encounter within last 12 months    Recent Outpatient Visits           5 months ago Annual physical exam   [facility] Jon Billings, NP   11 months ago Moderate episode of recurrent major depressive disorder ([place])   [place], Karen, NP   1 year ago Class 2 obesity due to excess calories without serious comorbidity with body mass index (BMI) of 36.0 to 36.9 in adult   [facility], [place] T, NP   1 year ago Class 2 obesity due to excess calories without serious comorbidity with body mass index (BMI) of 36.0 to 36.9 in adult   [place], [place] T, NP   2 years ago Migraine without status migrainosus, not intractable, unspecified migraine type   [facility], Lilia Argue, [place]       Future Appointments             In 1 month Philip Aspen, CNM [facility]           Off-Protocol Failed - 01/17/2022 11:02 AM      Failed - Medication not assigned to a protocol, review manually.      Passed - Valid encounter within last 12 months    Recent Outpatient Visits           5 months ago Annual physical exam   [facility] Jon Billings, NP   11 months ago Moderate episode of recurrent major depressive disorder ([place])   [place], Karen, NP   1 year ago Class 2 obesity due to excess  calories without serious comorbidity with body mass index (BMI) of 36.0 to 36.9 in adult   [facility], [place] T, NP   1 year ago Class 2 obesity due to excess calories without serious comorbidity with body mass index (BMI) of 36.0 to 36.9 in adult   [place], [place] T, NP   2 years ago Migraine without status migrainosus, not intractable, unspecified migraine type   [facility], Lilia Argue, [place]       Future Appointments             In 1 month Philip Aspen, CNM [facility]

## 2022-01-18 NOTE — Telephone Encounter (Signed)
Refill request for Aimovig. Please advise. Last office visit 08/20/2021, next scheduled OV for 01/20/2022.

## 2022-01-19 MED ORDER — AIMOVIG 70 MG/ML ~~LOC~~ SOAJ: 70.0000 mg | SUBCUTANEOUS | 0 refills | Status: DC

## 2022-01-20 ENCOUNTER — Ambulatory Visit: Payer: BC Managed Care – PPO | Admitting: Physician Assistant

## 2022-01-20 ENCOUNTER — Encounter: Payer: Self-pay | Admitting: Physician Assistant

## 2022-01-20 DIAGNOSIS — G43709 Chronic migraine without aura, not intractable, without status migrainosus: Secondary | ICD-10-CM

## 2022-01-20 NOTE — Progress Notes (Signed)
Established Patient Office Visit  Name: Mia Thompson   MRN: [id number]    DOB: April 18, 1976   Date:01/20/2022  Today's Provider: Jacquelin Hawking, MHS, PA-C Introduced myself to the patient as a PA-C and provided education on APPs in clinical practice.         Subjective  Chief Complaint  Chief Complaint  Patient presents with   Follow-up    Pt states aimovig is working well for her.     HPI  Migraines Currently taking Aimovig   MIGRAINES Duration: chronic Severity: mild Quality: dull and throbbing Frequency: a few times a month, bad headaches- not migraines since starting Aimovig  Location: occipital with radiation to frontal/ forehead  Headache duration: can last a few days without medications  Radiation: yes  Time of day headache occurs:  Alleviating factors:  Aggravating factors:  Headache status at time of visit: asymptomatic Treatments attempted: Treatments attempted: rest, triptans, and topamax   Aura: no Nausea:  yes Vomiting: no Photophobia:  yes Phonophobia:  no Effect on social functioning:  Not since starting Aimovig    Patient Active Problem List   Diagnosis Date Noted   Class 2 obesity due to excess calories without serious comorbidity with body mass index (BMI) of 36.0 to 36.9 in adult 05/11/2020   Insomnia 12/19/2019   Depression    GERD (gastroesophageal reflux disease)    B12 deficiency 04/16/2017   Migraine 03/10/2016   Allergic rhinitis 03/10/2016   Anxiety, generalized 03/10/2016    Past Surgical History:  Procedure Laterality Date   IUD REMOVAL N/A 05/08/2017   Procedure: INTRAUTERINE DEVICE (IUD) REMOVAL;  Surgeon: Hildred Laser, MD;  Location: [facility] ORS;  Service: Gynecology;  Laterality: N/A;   LAPAROSCOPIC LYSIS OF ADHESIONS  05/08/2017   Procedure: LAPAROSCOPIC LYSIS OF ADHESIONS;  Surgeon: Hildred Laser, MD;  Location: [facility] ORS;  Service: Gynecology;;   LAPAROSCOPY N/A 05/08/2017   Procedure: LAPAROSCOPY OPERATIVE;  Surgeon:  Hildred Laser, MD;  Location: [facility] ORS;  Service: Gynecology;  Laterality: N/A;   THUMB SURGERY Left AGE 5   WISDOM TOOTH EXTRACTION      Family History  Adopted: Yes  Problem Relation Age of Onset   Breast cancer Neg Hx     Social History   Tobacco Use   Smoking status: Former   Smokeless tobacco: Never  Substance Use Topics   Alcohol use: Not Currently    Comment: occassional     Current Outpatient Medications:    ARIPiprazole (ABILIFY) 2 MG tablet, Take 1 tablet (2 mg total) by mouth daily., Disp: 90 tablet, Rfl: 0   baclofen (LIORESAL) 10 MG tablet, Take 1 tablet (10 mg total) by mouth 2 (two) times daily as needed for muscle spasms., Disp: 60 tablet, Rfl: 0   cetirizine (ZYRTEC) 10 MG tablet, Take 10 mg by mouth at bedtime., Disp: , Rfl:    DULoxetine (CYMBALTA) 60 MG capsule, Take 1 capsule (60 mg total) by mouth daily., Disp: 90 capsule, Rfl: 0   Erenumab-aooe (AIMOVIG) 70 MG/ML SOAJ, Inject 70 mg into the skin every 30 (thirty) days., Disp: 1 mL, Rfl: 0   Lactobacillus Probiotic TABS, Take 5 Billion Cells by mouth daily., Disp: 90 tablet, Rfl: 1   Levonorgestrel-Ethinyl Estradiol (CAMRESE) 0.15-0.03 &0.01 MG tablet, Take 1 tablet by mouth at bedtime., Disp: 28 tablet, Rfl: 11   Multiple Vitamin (MULTI-VITAMINS) TABS, Take 1 tablet by mouth daily. , Disp: , Rfl:    naproxen (NAPROSYN) 500  MG tablet, Take 1 tablet (500 mg total) by mouth 2 (two) times daily as needed for headache., Disp: 60 tablet, Rfl: 0   pantoprazole (PROTONIX) 40 MG tablet, Take 1 tablet (40 mg total) by mouth daily., Disp: 90 tablet, Rfl: 4   promethazine (PHENERGAN) 25 MG tablet, Take 1 tablet (25 mg total) by mouth every 8 (eight) hours as needed for nausea or vomiting., Disp: 20 tablet, Rfl: 0   rizatriptan (MAXALT) 10 MG tablet, Take 1 tablet (10 mg total) by mouth as needed for migraine. May repeat in 2 hours if needed, Disp: 10 tablet, Rfl: 2   topiramate (TOPAMAX) 200 MG tablet, Take 1 tablet  (200 mg total) by mouth daily., Disp: 90 tablet, Rfl: 0   Vitamin D, Ergocalciferol, (DRISDOL) 1.25 MG (50000 UNIT) CAPS capsule, Take 1 capsule (50,000 Units total) by mouth every 7 (seven) days., Disp: 12 capsule, Rfl: 1   Liraglutide -Weight Management (SAXENDA) 18 MG/3ML SOPN, Inject 0.6 mg into the skin daily. Inject 0.6mg  weekly for 1 week, week 2 increase to 1.2mg , week 3 increase to 2.4mg  and week 4 increase to 3mg . (Patient not taking: Reported on 01/20/2022), Disp: 3 mL, Rfl: 0  Allergies  Allergen Reactions   Vantin [Cefpodoxime] Rash    Shortness of breath    I personally reviewed active problem list, medication list, allergies, notes from last encounter with the patient/caregiver today.   Review of Systems  Eyes:  Negative for blurred vision, double vision and photophobia.  Gastrointestinal:  Positive for nausea. Negative for vomiting.  Musculoskeletal:  Positive for myalgias.  Neurological:  Positive for headaches. Negative for dizziness.      Objective  Vitals:   01/20/22 1445  BP: 119/73  Pulse: 71  Temp: (!) 97.5 F (36.4 C)  TempSrc: Oral  SpO2: 98%  Weight: 216 lb (98 kg)    Body mass index is 38.75 kg/m.  Physical Exam Vitals reviewed.  Constitutional:      Appearance: Normal appearance. She is obese.  HENT:     Head: Normocephalic and atraumatic.  Eyes:     General: Lids are normal.     Extraocular Movements: Extraocular movements intact.     Right eye: Normal extraocular motion and no nystagmus.     Left eye: Normal extraocular motion and no nystagmus.     Conjunctiva/sclera: Conjunctivae normal.     Pupils: Pupils are equal, round, and reactive to light.  Neck:     Thyroid: No thyroid mass, thyromegaly or thyroid tenderness.  Cardiovascular:     Rate and Rhythm: Normal rate and regular rhythm.     Pulses: Normal pulses.     Heart sounds: Normal heart sounds.  Musculoskeletal:     Cervical back: Normal range of motion and neck supple.      Right lower leg: No edema.     Left lower leg: No edema.  Lymphadenopathy:     Head:     Right side of head: No submental or submandibular adenopathy.     Left side of head: No submental or submandibular adenopathy.     Upper Body:     Right upper body: No supraclavicular adenopathy.     Left upper body: No supraclavicular adenopathy.  Neurological:     Mental Status: She is alert.  Psychiatric:        Attention and Perception: Attention and perception normal.        Mood and Affect: Mood and affect normal.  Speech: Speech normal.        Behavior: Behavior normal. Behavior is cooperative.      No results found for this or any previous visit (from the past 2160 hour(s)).   PHQ2/9:    01/20/2022    2:54 PM 08/20/2021    3:08 PM 02/17/2021    3:43 PM 11/04/2020    9:05 AM 08/12/2020    3:44 PM  Depression screen PHQ 2/9  Decreased Interest 0 0 0 3 0  Down, Depressed, Hopeless 0 0 0 1 0  PHQ - 2 Score 0 0 0 4 0  Altered sleeping 1 3 3 3    Tired, decreased energy 1 3 3 3    Change in appetite 1 3 0 3   Feeling bad or failure about yourself  0 0 0 0   Trouble concentrating 1 0 0 1   Moving slowly or fidgety/restless 0 0 0 0   Suicidal thoughts 0 0 0 0   PHQ-9 Score 4 9 6 14    Difficult doing work/chores Not difficult at all Not difficult at all Not difficult at all        Fall Risk:    01/20/2022    2:54 PM 08/20/2021    3:07 PM 08/12/2020    3:44 PM 12/19/2019    1:32 PM 06/07/2018    1:08 PM  Fall Risk   Falls in the past year? 0 0 0 0 No  Number falls in past yr: 0 0  0   Injury with Fall? 0 0  0   Risk for fall due to : No Fall Risks No Fall Risks     Follow up Falls evaluation completed Falls evaluation completed         Functional Status Survey:      Assessment & Plan  Problem List Items Addressed This Visit       Cardiovascular and Mediastinum   Migraine    Chronic, historic condition Appears well managed with current medication regimen  She  is currently taking Aimovig, Rizatriptan, Naproxen, and Topiramate  Continue current medications as they have reduced monthly migraine days and provided extensive relief Follow up as needed for persistent or progressing symptoms        Return in about 3 months (around 04/22/2022) for Weight management .   I, Deysi Soldo E Katerra Ingman, PA-C, have reviewed all documentation for this visit. The documentation on 01/20/22 for the exam, diagnosis, procedures, and orders are all accurate and complete.   06/09/2018, MHS, PA-C [facility]

## 2022-01-20 NOTE — Assessment & Plan Note (Addendum)
Chronic, historic condition Appears well managed with current medication regimen  She is currently taking Aimovig, Rizatriptan, Naproxen, and Topiramate  Continue current medications as they have reduced monthly migraine days and provided extensive relief Follow up as needed for persistent or progressing symptoms

## 2022-02-10 ENCOUNTER — Other Ambulatory Visit: Payer: Self-pay | Admitting: Nurse Practitioner

## 2022-02-10 MED ORDER — DULOXETINE HCL 60 MG PO CPEP
60.0000 mg | ORAL_CAPSULE | Freq: Every day | ORAL | 1 refills | Status: DC
Start: 2022-02-10 — End: 2022-08-11

## 2022-02-15 ENCOUNTER — Other Ambulatory Visit: Payer: Self-pay

## 2022-02-15 MED ORDER — ARIPIPRAZOLE 2 MG PO TABS
2.0000 mg | ORAL_TABLET | Freq: Every day | ORAL | 1 refills | Status: DC
Start: 2022-02-15 — End: 2022-08-11

## 2022-02-15 NOTE — Telephone Encounter (Signed)
[facility] refill rx for abilify 2 mg. Rx'd 10/08/21 with 0 refills. Last seen by Elberta Fortis on 01/20/2022

## 2022-02-22 ENCOUNTER — Encounter: Payer: Self-pay | Admitting: Certified Nurse Midwife

## 2022-02-22 ENCOUNTER — Ambulatory Visit (INDEPENDENT_AMBULATORY_CARE_PROVIDER_SITE_OTHER): Payer: BC Managed Care – PPO | Admitting: Certified Nurse Midwife

## 2022-02-22 ENCOUNTER — Other Ambulatory Visit (HOSPITAL_COMMUNITY)
Admission: RE | Admit: 2022-02-22 | Discharge: 2022-02-22 | Disposition: A | Discharge status: Home / Self Care | Payer: BC Managed Care – PPO | Source: Ambulatory Visit | Attending: Certified Nurse Midwife | Admitting: Certified Nurse Midwife

## 2022-02-22 VITALS — BP 124/78 | HR 102 | Ht 64.0 in | Wt 214.6 lb

## 2022-02-22 DIAGNOSIS — Z01419 Encounter for gynecological examination (general) (routine) without abnormal findings: Secondary | ICD-10-CM | POA: Insufficient documentation

## 2022-02-22 DIAGNOSIS — R32 Unspecified urinary incontinence: Secondary | ICD-10-CM

## 2022-02-22 DIAGNOSIS — Z1231 Encounter for screening mammogram for malignant neoplasm of breast: Secondary | ICD-10-CM

## 2022-02-22 DIAGNOSIS — Z124 Encounter for screening for malignant neoplasm of cervix: Secondary | ICD-10-CM

## 2022-02-22 DIAGNOSIS — N8110 Cystocele, unspecified: Secondary | ICD-10-CM

## 2022-02-22 MED ORDER — LEVONORGEST-ETH ESTRAD 91-DAY 0.15-0.03 &0.01 MG PO TABS: 1.0000 | ORAL_TABLET | Freq: Every day | ORAL | 11 refills | Status: DC

## 2022-02-22 NOTE — Addendum Note (Signed)
Addended by: Mechele Claude on: 02/22/2022 03:09 PM   Modules accepted: Orders

## 2022-02-22 NOTE — Patient Instructions (Signed)
Preventive Care 45-45 Years Old, Female Preventive care refers to lifestyle choices and visits with your health care provider that can promote health and wellness. Preventive care visits are also called wellness exams. What can I expect for my preventive care visit? Counseling Your health care provider may ask you questions about your: Medical history, including: Past medical problems. Family medical history. Pregnancy history. Current health, including: Menstrual cycle. Method of birth control. Emotional well-being. Home life and relationship well-being. Sexual activity and sexual health. Lifestyle, including: Alcohol, nicotine or tobacco, and drug use. Access to firearms. Diet, exercise, and sleep habits. Work and work environment. Sunscreen use. Safety issues such as seatbelt and bike helmet use. Physical exam Your health care provider will check your: Height and weight. These may be used to calculate your BMI (body mass index). BMI is a measurement that tells if you are at a healthy weight. Waist circumference. This measures the distance around your waistline. This measurement also tells if you are at a healthy weight and may help predict your risk of certain diseases, such as type 2 diabetes and high blood pressure. Heart rate and blood pressure. Body temperature. Skin for abnormal spots. What immunizations do I need?  Vaccines are usually given at various ages, according to a schedule. Your health care provider will recommend vaccines for you based on your age, medical history, and lifestyle or other factors, such as travel or where you work. What tests do I need? Screening Your health care provider may recommend screening tests for certain conditions. This may include: Lipid and cholesterol levels. Diabetes screening. This is done by checking your blood sugar (glucose) after you have not eaten for a while (fasting). Pelvic exam and Pap test. Hepatitis B test. Hepatitis C  test. HIV (human immunodeficiency virus) test. STI (sexually transmitted infection) testing, if you are at risk. Lung cancer screening. Colorectal cancer screening. Mammogram. Talk with your health care provider about when you should start having regular mammograms. This may depend on whether you have a family history of breast cancer. BRCA-related cancer screening. This may be done if you have a family history of breast, ovarian, tubal, or peritoneal cancers. Bone density scan. This is done to screen for osteoporosis. Talk with your health care provider about your test results, treatment options, and if necessary, the need for more tests. Follow these instructions at home: Eating and drinking  Eat a diet that includes fresh fruits and vegetables, whole grains, lean protein, and low-fat dairy products. Take vitamin and mineral supplements as recommended by your health care provider. Do not drink alcohol if: Your health care provider tells you not to drink. You are pregnant, may be pregnant, or are planning to become pregnant. If you drink alcohol: Limit how much you have to 0-1 drink a day. Know how much alcohol is in your drink. In the [place] one drink equals one 12 oz bottle of beer (355 mL), one 5 oz glass of wine (148 mL), or one 1 oz glass of hard liquor (44 mL). Lifestyle Brush your teeth every morning and night with fluoride toothpaste. Floss one time each day. Exercise for at least 30 minutes 5 or more days each week. Do not use any products that contain nicotine or tobacco. These products include cigarettes, chewing tobacco, and vaping devices, such as e-cigarettes. If you need help quitting, ask your health care provider. Do not use drugs. If you are sexually active, practice safe sex. Use a condom or other form of protection to   prevent STIs. If you do not wish to become pregnant, use a form of birth control. If you plan to become pregnant, see your health care provider for a  prepregnancy visit. Take aspirin only as told by your health care provider. Make sure that you understand how much to take and what form to take. Work with your health care provider to find out whether it is safe and beneficial for you to take aspirin daily. Find healthy ways to manage stress, such as: Meditation, yoga, or listening to music. Journaling. Talking to a trusted person. Spending time with friends and family. Minimize exposure to UV radiation to reduce your risk of skin cancer. Safety Always wear your seat belt while driving or riding in a vehicle. Do not drive: If you have been drinking alcohol. Do not ride with someone who has been drinking. When you are tired or distracted. While texting. If you have been using any mind-altering substances or drugs. Wear a helmet and other protective equipment during sports activities. If you have firearms in your house, make sure you follow all gun safety procedures. Seek help if you have been physically or sexually abused. What's next? Visit your health care provider once a year for an annual wellness visit. Ask your health care provider how often you should have your eyes and teeth checked. Stay up to date on all vaccines. This information is not intended to replace advice given to you by your health care provider. Make sure you discuss any questions you have with your health care provider. Document Revised: 01/20/2021 Document Reviewed: 01/20/2021 Elsevier Patient Education  [place].

## 2022-02-22 NOTE — Progress Notes (Signed)
GYNECOLOGY ANNUAL PREVENTATIVE CARE ENCOUNTER NOTE  History:     Mia Thompson is a 46 y.o. [id number] female here for a routine annual gynecologic exam.  Current complaints: urine incontinence with coughing. Pt state she has history of bladder prolapse and was told she could use a pessary but declined. She is looking for alternative options..   Denies abnormal vaginal bleeding, discharge, pelvic pain, problems with intercourse or other gynecologic concerns.     Social Relationship: married Living: spouse and children  Work: Associate Professor  Exercise: walking 2 x wk  Smoke/Alcohol/drug use: occasional alcohol use.   Gynecologic History No LMP recorded. (Menstrual status: Oral contraceptives). Contraception: OCP (estrogen/progesterone) Last Pap: 02/16/21. Results were: normal with positive HPV Last mammogram: 04/06/21. Results were: normal   Upstream - 02/22/22 1437       Pregnancy Intention Screening   Does the patient want to become pregnant in the next year? No    Does the patient's partner want to become pregnant in the next year? No    Would the patient like to discuss contraceptive options today? No            The pregnancy intention screening data noted above was reviewed. Potential methods of contraception were discussed. The patient elected to proceed with ocp.   Obstetric History OB History  Gravida Para Term Preterm AB Living  3 3 3     3   SAB IAB Ectopic Multiple Live Births          3    # Outcome Date GA Lbr Len/2nd Weight Sex Delivery Anes PTL Lv  3 Term 02/09/10   7 lb 6 oz (3.345 kg) M Vag-Spont  N LIV  2 Term 02/26/04   8 lb 6 oz (3.799 kg) F Vag-Spont  N LIV  1 Term 11/21/00   7 lb 11 oz (3.487 kg) F Vag-Spont  N LIV    Past Medical History:  Diagnosis Date   Anxiety    Depression    GERD (gastroesophageal reflux disease)    Migraines    Migraines    sees Neuro   Prolapse of female bladder, acquired     Past Surgical History:  Procedure  Laterality Date   IUD REMOVAL N/A 05/08/2017   Procedure: INTRAUTERINE DEVICE (IUD) REMOVAL;  Surgeon: 07/08/2017, MD;  Location: [facility] ORS;  Service: Gynecology;  Laterality: N/A;   LAPAROSCOPIC LYSIS OF ADHESIONS  05/08/2017   Procedure: LAPAROSCOPIC LYSIS OF ADHESIONS;  Surgeon: 07/08/2017, MD;  Location: [facility] ORS;  Service: Gynecology;;   LAPAROSCOPY N/A 05/08/2017   Procedure: LAPAROSCOPY OPERATIVE;  Surgeon: 07/08/2017, MD;  Location: [facility] ORS;  Service: Gynecology;  Laterality: N/A;   THUMB SURGERY Left AGE 36   WISDOM TOOTH EXTRACTION      Current Outpatient Medications on File Prior to Visit  Medication Sig Dispense Refill   ARIPiprazole (ABILIFY) 2 MG tablet Take 1 tablet (2 mg total) by mouth daily. 90 tablet 1   baclofen (LIORESAL) 10 MG tablet Take 1 tablet (10 mg total) by mouth 2 (two) times daily as needed for muscle spasms. 60 tablet 0   cetirizine (ZYRTEC) 10 MG tablet Take 10 mg by mouth at bedtime.     DULoxetine (CYMBALTA) 60 MG capsule Take 1 capsule (60 mg total) by mouth daily. 90 capsule 1   Erenumab-aooe (AIMOVIG) 70 MG/ML SOAJ Inject 70 mg into the skin every 30 (thirty) days. 1 mL 0   Lactobacillus Probiotic TABS Take  5 Billion Cells by mouth daily. 90 tablet 1   Levonorgestrel-Ethinyl Estradiol (CAMRESE) 0.15-0.03 &0.01 MG tablet Take 1 tablet by mouth at bedtime. 28 tablet 11   Multiple Vitamin (MULTI-VITAMINS) TABS Take 1 tablet by mouth daily.      naproxen (NAPROSYN) 500 MG tablet Take 1 tablet (500 mg total) by mouth 2 (two) times daily as needed for headache. 60 tablet 0   pantoprazole (PROTONIX) 40 MG tablet Take 1 tablet (40 mg total) by mouth daily. 90 tablet 1   promethazine (PHENERGAN) 25 MG tablet Take 1 tablet (25 mg total) by mouth every 8 (eight) hours as needed for nausea or vomiting. 20 tablet 0   rizatriptan (MAXALT) 10 MG tablet Take 1 tablet (10 mg total) by mouth as needed for migraine. May repeat in 2 hours if needed 10 tablet 2    topiramate (TOPAMAX) 200 MG tablet Take 1 tablet (200 mg total) by mouth daily. 90 tablet 1   Vitamin D, Ergocalciferol, (DRISDOL) 1.25 MG (50000 UNIT) CAPS capsule Take 1 capsule (50,000 Units total) by mouth every 7 (seven) days. 12 capsule 1   Liraglutide -Weight Management (SAXENDA) 18 MG/3ML SOPN Inject 0.6 mg into the skin daily. Inject 0.6mg  weekly for 1 week, week 2 increase to 1.2mg , week 3 increase to 2.4mg  and week 4 increase to 3mg . (Patient not taking: Reported on 01/20/2022) 3 mL 0   No current facility-administered medications on file prior to visit.    Allergies  Allergen Reactions   Vantin [Cefpodoxime] Rash    Shortness of breath    Social History:  reports that she has quit smoking. She has never used smokeless tobacco. She reports that she does not currently use alcohol. She reports that she does not use drugs.  Family History  Adopted: Yes  Problem Relation Age of Onset   Breast cancer Neg Hx     The following portions of the patient's history were reviewed and updated as appropriate: allergies, current medications, past family history, past medical history, past social history, past surgical history and problem list.  Review of Systems Pertinent items noted in HPI and remainder of comprehensive ROS otherwise negative.  Physical Exam:  BP 124/78   Pulse (!) 102   Ht 5\' 4"  (1.626 m)   Wt 214 lb 9.6 oz (97.3 kg)   BMI 36.84 kg/m  CONSTITUTIONAL: Well-developed, well-nourished female in no acute distress.  HENT:  Normocephalic, atraumatic, External right and left ear normal. Oropharynx is clear and moist EYES: Conjunctivae and EOM are normal. Pupils are equal, round, and reactive to light. No scleral icterus.  NECK: Normal range of motion, supple, no masses.  Normal thyroid.  SKIN: Skin is warm and dry. No rash noted. Not diaphoretic. No erythema. No pallor. MUSCULOSKELETAL: Normal range of motion. No tenderness.  No cyanosis, clubbing, or edema.  2+ distal  pulses. NEUROLOGIC: Alert and oriented to person, place, and time. Normal reflexes, muscle tone coordination.  PSYCHIATRIC: Normal mood and affect. Normal behavior. Normal judgment and thought content. CARDIOVASCULAR: Normal heart rate noted, regular rhythm RESPIRATORY: Clear to auscultation bilaterally. Effort and breath sounds normal, no problems with respiration noted. BREASTS: Symmetric in size. No masses, tenderness, skin changes, nipple drainage, or lymphadenopathy bilaterally.  ABDOMEN: Soft, no distention noted.  No tenderness, rebound or guarding.  PELVIC: Normal appearing external genitalia and urethral meatus; normal appearing vaginal mucosa and cervix.  No abnormal discharge noted.  Pap smear obtained.  Normal uterine size, no other palpable masses, no  uterine or adnexal tenderness.  .   Assessment and Plan:    1. Well woman exam with routine gynecological exam  Pap:Will follow up results of pap smear and manage accordingly. Mammogram : ordered Labs: none Refills: ocp-reviewed risk due to age Colonoscopy-pt declines this year.  Referral: pelvic floor therapy  Routine preventative health maintenance measures emphasized. Please refer to After Visit Summary for other counseling recommendations.      Doreene Burke, CNM [facility],  [facility]

## 2022-02-28 ENCOUNTER — Other Ambulatory Visit: Payer: Self-pay | Admitting: Nurse Practitioner

## 2022-02-28 LAB — CYTOLOGY - PAP
Comment: NEGATIVE
Diagnosis: NEGATIVE
High risk HPV: NEGATIVE

## 2022-03-01 NOTE — Telephone Encounter (Signed)
Requested Prescriptions  Pending Prescriptions Disp Refills  . naproxen (NAPROSYN) 500 MG tablet [Pharmacy Med Name: NAPROXEN 500 MG TABLET] 60 tablet 0    Sig: Take 1 tablet (500 mg total) by mouth 2 (two) times daily as needed for headache.     Analgesics:  NSAIDS Failed - 02/28/2022  3:19 PM      Failed - Manual Review: Labs are only required if the patient has taken medication for more than 8 weeks.      Passed - Cr in normal range and within 360 days    Creatinine, Ser  Date Value Ref Range Status  08/20/2021 0.93 0.57 - 1.00 mg/dL Final         Passed - HGB in normal range and within 360 days    Hemoglobin  Date Value Ref Range Status  08/20/2021 14.3 11.1 - 15.9 g/dL Final         Passed - PLT in normal range and within 360 days    Platelets  Date Value Ref Range Status  08/20/2021 306 150 - 450 x10E3/uL Final         Passed - HCT in normal range and within 360 days    Hematocrit  Date Value Ref Range Status  08/20/2021 42.7 34.0 - 46.6 % Final         Passed - eGFR is 30 or above and within 360 days    GFR calc Af Amer  Date Value Ref Range Status  05/11/2020 80 >59 mL/min/1.73 Final    Comment:    **[facility] currently reports eGFR in compliance with the current**   recommendations of the [employer]. [facility] will   update reporting as new guidelines are published from the NKF-ASN   Task force.    GFR calc non Af Amer  Date Value Ref Range Status  05/11/2020 69 >59 mL/min/1.73 Final   eGFR  Date Value Ref Range Status  08/20/2021 78 >59 mL/min/1.73 Final         Passed - Patient is not pregnant      Passed - Valid encounter within last 12 months    Recent Outpatient Visits          1 month ago Chronic migraine without aura without status migrainosus, not intractable   [facility] Mecum, Erin E, PA-C   6 months ago Annual physical exam   [facility] Jon Billings, NP   1 year ago Moderate episode of  recurrent major depressive disorder ([place])   [place], Karen, NP   1 year ago Class 2 obesity due to excess calories without serious comorbidity with body mass index (BMI) of 36.0 to 36.9 in adult   [facility], [place] T, NP   1 year ago Class 2 obesity due to excess calories without serious comorbidity with body mass index (BMI) of 36.0 to 36.9 in adult   [place], Barbaraann Faster, NP      Future Appointments            In 1 month Jon Billings, NP [facility], [facility]

## 2022-03-16 ENCOUNTER — Encounter (INDEPENDENT_AMBULATORY_CARE_PROVIDER_SITE_OTHER): Payer: Self-pay

## 2022-03-17 ENCOUNTER — Other Ambulatory Visit: Payer: Self-pay | Admitting: Nurse Practitioner

## 2022-03-17 NOTE — Telephone Encounter (Signed)
Requested medication (s) are due for refill today: yes  Requested medication (s) are on the active medication list: yes  Last refill:  01/19/22 1 ml with 0 RF  Future visit scheduled: 04/22/22, seen 01/20/22  Notes to clinic:  no protocol, please assess.      Requested Prescriptions  Pending Prescriptions Disp Refills   AIMOVIG 70 MG/ML SOAJ [Pharmacy Med Name: AIMOVIG 70 MG/ML AUTOINJECTOR] 1 mL 0    Sig: Inject 70 mg into the skin every 30 (thirty) days.     Off-Protocol Failed - 03/17/2022  9:15 AM      Failed - Medication not assigned to a protocol, review manually.      Passed - Valid encounter within last 12 months    Recent Outpatient Visits           1 month ago Chronic migraine without aura without status migrainosus, not intractable   [facility] Mecum, Oswaldo Conroy, PA-C   6 months ago Annual physical exam   [facility] Larae Grooms, NP   1 year ago Moderate episode of recurrent major depressive disorder (HCC)   [facility] Larae Grooms, NP   1 year ago Class 2 obesity due to excess calories without serious comorbidity with body mass index (BMI) of 36.0 to 36.9 in adult   [facility], [place] T, NP   1 year ago Class 2 obesity due to excess calories without serious comorbidity with body mass index (BMI) of 36.0 to 36.9 in adult   [facility], Dorie Rank, NP       Future Appointments             In 1 month Larae Grooms, NP [facility], [facility]           Off-Protocol Failed - 03/17/2022  9:15 AM      Failed - Medication not assigned to a protocol, review manually.      Passed - Valid encounter within last 12 months    Recent Outpatient Visits           1 month ago Chronic migraine without aura without status migrainosus, not intractable   [facility] Mecum, Oswaldo Conroy, PA-C   6 months ago Annual physical exam   [facility] Larae Grooms, NP   1 year ago Moderate episode of recurrent major depressive disorder (HCC)   [facility] Larae Grooms, NP   1 year ago Class 2 obesity due to excess calories without serious comorbidity with body mass index (BMI) of 36.0 to 36.9 in adult   [facility], [place] T, NP   1 year ago Class 2 obesity due to excess calories without serious comorbidity with body mass index (BMI) of 36.0 to 36.9 in adult   [facility], Dorie Rank, NP       Future Appointments             In 1 month Larae Grooms, NP [facility], [facility]

## 2022-04-14 ENCOUNTER — Other Ambulatory Visit: Payer: Self-pay | Admitting: Nurse Practitioner

## 2022-04-15 NOTE — Telephone Encounter (Signed)
Requested medication (s) are due for refill today: yes  Requested medication (s) are on the active medication list: yes  Last refill:  03/17/22  1 ml  Future visit scheduled: yes  Notes to clinic:  med not assigned to a protocol   Requested Prescriptions  Pending Prescriptions Disp Refills   AIMOVIG 70 MG/ML SOAJ [Pharmacy Med Name: AIMOVIG 70 MG/ML AUTOINJECTOR] 1 mL 0    Sig: Inject 70 mg into the skin every 30 (thirty) days.     Off-Protocol Failed - 04/14/2022  3:31 PM      Failed - Medication not assigned to a protocol, review manually.      Passed - Valid encounter within last 12 months    Recent Outpatient Visits           2 months ago Chronic migraine without aura without status migrainosus, not intractable   [facility] Mecum, Oswaldo Conroy, PA-C   7 months ago Annual physical exam   [facility] Larae Grooms, NP   1 year ago Moderate episode of recurrent major depressive disorder (HCC)   [facility] Larae Grooms, NP   1 year ago Class 2 obesity due to excess calories without serious comorbidity with body mass index (BMI) of 36.0 to 36.9 in adult   [facility], [place] T, NP   1 year ago Class 2 obesity due to excess calories without serious comorbidity with body mass index (BMI) of 36.0 to 36.9 in adult   [facility], Dorie Rank, NP       Future Appointments             In 4 days Larae Grooms, NP [facility], [facility]           Off-Protocol Failed - 04/14/2022  3:31 PM      Failed - Medication not assigned to a protocol, review manually.      Passed - Valid encounter within last 12 months    Recent Outpatient Visits           2 months ago Chronic migraine without aura without status migrainosus, not intractable   [facility] Mecum, Oswaldo Conroy, PA-C   7 months ago Annual physical exam   [facility] Larae Grooms, NP   1 year ago Moderate  episode of recurrent major depressive disorder (HCC)   [facility] Larae Grooms, NP   1 year ago Class 2 obesity due to excess calories without serious comorbidity with body mass index (BMI) of 36.0 to 36.9 in adult   [facility], [place] T, NP   1 year ago Class 2 obesity due to excess calories without serious comorbidity with body mass index (BMI) of 36.0 to 36.9 in adult   [facility], Dorie Rank, NP       Future Appointments             In 4 days Larae Grooms, NP [facility], [facility]

## 2022-04-19 ENCOUNTER — Encounter: Payer: Self-pay | Admitting: Nurse Practitioner

## 2022-04-19 ENCOUNTER — Ambulatory Visit: Payer: BC Managed Care – PPO | Admitting: Nurse Practitioner

## 2022-04-19 VITALS — BP 100/68 | HR 80 | Temp 97.7°F | Wt 220.9 lb

## 2022-04-19 DIAGNOSIS — F411 Generalized anxiety disorder: Secondary | ICD-10-CM

## 2022-04-19 DIAGNOSIS — E538 Deficiency of other specified B group vitamins: Secondary | ICD-10-CM

## 2022-04-19 DIAGNOSIS — F331 Major depressive disorder, recurrent, moderate: Secondary | ICD-10-CM | POA: Diagnosis not present

## 2022-04-19 DIAGNOSIS — Z6836 Body mass index (BMI) 36.0-36.9, adult: Secondary | ICD-10-CM | POA: Diagnosis not present

## 2022-04-19 DIAGNOSIS — E7800 Pure hypercholesterolemia, unspecified: Secondary | ICD-10-CM

## 2022-04-19 DIAGNOSIS — E6609 Other obesity due to excess calories: Secondary | ICD-10-CM

## 2022-04-19 MED ORDER — WEGOVY 0.25 MG/0.5ML ~~LOC~~ SOAJ: 0.2500 mg | SUBCUTANEOUS | 0 refills | Status: DC

## 2022-04-19 NOTE — Assessment & Plan Note (Signed)
Labs ordered at visit today.  Will make recommendations based on lab results.   

## 2022-04-19 NOTE — Progress Notes (Signed)
BP 100/68   Pulse 80   Temp 97.7 F (36.5 C) (Oral)   Wt 220 lb 14.4 oz (100.2 kg)   LMP  (LMP Unknown)   SpO2 98%   BMI 37.92 kg/m    Subjective:    Patient ID: Mia Thompson, female    DOB: [birth year], 46 y.o.   MRN: [id number]  HPI: Mia Thompson is a 46 y.o. female  Chief Complaint  Patient presents with   Obesity   DEPRESSION/ANXIETY Patient feels like her depression and anxiety are well controlled.  She is still taking the Abilify 65m and Cymbalta 68m  Denies SI. Denies concerns regarding her mood in office today. Feels like she is doing great.  [place] Visit from 04/19/2022 in [place] Total Score 8       MIGRAINES Well controlled.  Patient states the Aimovig is extremely helpful and controlling the migraines. Patient states since she has bene on the Aimovig she would say she has headaches and not migraines and gets 1-2 per month.  Patient takes Naproxen and it takes care of the pain. Not even sure if they are migraines but more headaches at this point.  WEIGHT GAIN Duration: years Previous attempts at weight loss: yes Complications of obesity: insulin resistance Peak weight: 220 lbs Requesting obesity pharmacotherapy: yes Current weight loss supplements/medications: no Previous weight loss supplements/meds: no Calories:    Denies HA, CP, SOB, dizziness, palpitations, visual changes, and lower extremity swelling.  Relevant past medical, surgical, family and social history reviewed and updated as indicated. Interim medical history since our last visit reviewed. Allergies and medications reviewed and updated.  Review of Systems  Eyes:  Negative for visual disturbance.  Respiratory:  Negative for cough, chest tightness and shortness of breath.   Cardiovascular:  Negative for chest pain, palpitations and leg swelling.  Neurological:  Negative for dizziness and headaches.  Psychiatric/Behavioral:  Positive for dysphoric  mood. Negative for suicidal ideas. The patient is nervous/anxious.     Per HPI unless specifically indicated above     Objective:    BP 100/68   Pulse 80   Temp 97.7 F (36.5 C) (Oral)   Wt 220 lb 14.4 oz (100.2 kg)   LMP  (LMP Unknown)   SpO2 98%   BMI 37.92 kg/m   Wt Readings from Last 3 Encounters:  04/19/22 220 lb 14.4 oz (100.2 kg)  02/22/22 214 lb 9.6 oz (97.3 kg)  01/20/22 216 lb (98 kg)    Physical Exam Vitals and nursing note reviewed.  Constitutional:      General: She is not in acute distress.    Appearance: Normal appearance. She is obese. She is not ill-appearing, toxic-appearing or diaphoretic.  HENT:     Head: Normocephalic.     Right Ear: External ear normal.     Left Ear: External ear normal.     Nose: Nose normal.     Mouth/Throat:     Mouth: Mucous membranes are moist.     Pharynx: Oropharynx is clear.  Eyes:     General:        Right eye: No discharge.        Left eye: No discharge.     Extraocular Movements: Extraocular movements intact.     Conjunctiva/sclera: Conjunctivae normal.     Pupils: Pupils are equal, round, and reactive to light.  Cardiovascular:     Rate and Rhythm: Normal rate and regular rhythm.  Heart sounds: No murmur heard. Pulmonary:     Effort: Pulmonary effort is normal. No respiratory distress.     Breath sounds: Normal breath sounds. No wheezing or rales.  Abdominal:     General: Abdomen is flat. Bowel sounds are normal.     Palpations: Abdomen is soft.     Tenderness: There is no abdominal tenderness. There is no guarding.  Musculoskeletal:     Cervical back: Normal range of motion and neck supple.  Skin:    General: Skin is warm and dry.     Capillary Refill: Capillary refill takes less than 2 seconds.  Neurological:     General: No focal deficit present.     Mental Status: She is alert and oriented to person, place, and time. Mental status is at baseline.  Psychiatric:        Mood and Affect: Mood normal.         Behavior: Behavior normal.        Thought Content: Thought content normal.        Judgment: Judgment normal.     Results for orders placed or performed in visit on 02/22/22  Cytology - PAP  Result Value Ref Range   High risk HPV Negative    Adequacy      Satisfactory for evaluation; transformation zone component PRESENT.   Diagnosis      - Negative for intraepithelial lesion or malignancy (NILM)   Comment Normal Reference Range HPV - Negative       Assessment & Plan:   Problem List Items Addressed This Visit       Other   Anxiety, generalized    Chronic.  Controlled.  Continue with current medication regimen.  Labs ordered today.  Return to clinic in 6 months for reevaluation.  Call sooner if concerns arise.        Relevant Orders   Comp Met (CMET)   B12 deficiency    Labs ordered at visit today.  Will make recommendations based on lab results.         Relevant Orders   Comp Met (CMET)   B12   Depression - Primary    Chronic.  Controlled.  Continue with current medication regimen.  Labs ordered today.  Return to clinic in 6 months for reevaluation.  Call sooner if concerns arise.        Class 2 obesity due to excess calories without serious comorbidity with body mass index (BMI) of 36.0 to 36.9 in adult    Chronic. Would like to try [facility] for weight loss.  Discussed side effects and benefits of medication during visit today.  Discussed how to titrate medication.  Discussed how to inject medication.  Discussed the importance of health habits and needing to improve diet and exercise. Follow up in 1 month if medication is approved.  Can consider metformin due to insulin resistance if Mancel Parsons is not approved.      Relevant Medications   Semaglutide-Weight Management (WEGOVY) 0.25 MG/0.5ML SOAJ   Other Relevant Orders   Comp Met (CMET)   Other Visit Diagnoses     Elevated LDL cholesterol level            Follow up plan: Return in about 6 months (around  10/18/2022) for Physical and Fasting labs.

## 2022-04-19 NOTE — Assessment & Plan Note (Signed)
Chronic. Would like to try [facility] for weight loss.  Discussed side effects and benefits of medication during visit today.  Discussed how to titrate medication.  Discussed how to inject medication.  Discussed the importance of health habits and needing to improve diet and exercise. Follow up in 1 month if medication is approved.  Can consider metformin due to insulin resistance if Reginal Lutes is not approved.

## 2022-04-19 NOTE — Assessment & Plan Note (Signed)
Chronic.  Controlled.  Continue with current medication regimen.  Labs ordered today.  Return to clinic in 6 months for reevaluation.  Call sooner if concerns arise.  ? ?

## 2022-04-20 ENCOUNTER — Telehealth: Payer: Self-pay

## 2022-04-20 LAB — COMPREHENSIVE METABOLIC PANEL
ALT: 12 IU/L (ref 0–32)
AST: 9 IU/L (ref 0–40)
Albumin/Globulin Ratio: 2 (ref 1.2–2.2)
Albumin: 4.3 g/dL (ref 3.9–4.9)
Alkaline Phosphatase: 63 IU/L (ref 44–121)
BUN/Creatinine Ratio: 16 (ref 9–23)
BUN: 16 mg/dL (ref 6–24)
Bilirubin Total: <0.2 mg/dL (ref 0.0–1.2)
CO2: 20 mmol/L (ref 20–29)
Calcium: 9.1 mg/dL (ref 8.7–10.2)
Chloride: 106 mmol/L (ref 96–106)
Creatinine, Ser: 0.98 mg/dL (ref 0.57–1.00)
Globulin, Total: 2.1 g/dL (ref 1.5–4.5)
Glucose: 101 mg/dL — ABNORMAL HIGH (ref 70–99)
Potassium: 4.8 mmol/L (ref 3.5–5.2)
Sodium: 140 mmol/L (ref 134–144)
Total Protein: 6.4 g/dL (ref 6.0–8.5)
eGFR: 73 mL/min/{1.73_m2} (ref >59)

## 2022-04-20 LAB — VITAMIN B12: Vitamin B-12: 697 pg/mL (ref 232–1245)

## 2022-04-20 NOTE — Telephone Encounter (Signed)
PA has been initated via covermymeds for [employer]. Awaiting determination.    Key: [id number]

## 2022-04-20 NOTE — Progress Notes (Signed)
Hi Vernona Rieger.  Your lab work looks great.  No concerns at this time.  Follow up as discussed.

## 2022-04-21 ENCOUNTER — Telehealth: Payer: Self-pay

## 2022-04-21 NOTE — Telephone Encounter (Signed)
PA for Reginal Lutes has been approved, patient notified via mychart messages.

## 2022-04-22 ENCOUNTER — Ambulatory Visit: Payer: BC Managed Care – PPO | Admitting: Nurse Practitioner

## 2022-05-10 ENCOUNTER — Encounter: Payer: Self-pay | Admitting: Nurse Practitioner

## 2022-05-10 MED ORDER — WEGOVY 0.5 MG/0.5ML ~~LOC~~ SOAJ: 0.5000 mg | SUBCUTANEOUS | 0 refills | Status: DC

## 2022-05-11 ENCOUNTER — Encounter: Payer: Self-pay | Admitting: Nurse Practitioner

## 2022-05-12 MED ORDER — WEGOVY 1 MG/0.5ML ~~LOC~~ SOAJ: 1.0000 mg | SUBCUTANEOUS | 0 refills | Status: DC

## 2022-05-28 DIAGNOSIS — Z3202 Encounter for pregnancy test, result negative: Secondary | ICD-10-CM | POA: Diagnosis not present

## 2022-05-28 DIAGNOSIS — R109 Unspecified abdominal pain: Secondary | ICD-10-CM | POA: Diagnosis not present

## 2022-05-28 DIAGNOSIS — R102 Pelvic and perineal pain: Secondary | ICD-10-CM | POA: Diagnosis not present

## 2022-05-28 DIAGNOSIS — R1032 Left lower quadrant pain: Secondary | ICD-10-CM | POA: Diagnosis not present

## 2022-05-29 DIAGNOSIS — R109 Unspecified abdominal pain: Secondary | ICD-10-CM | POA: Diagnosis not present

## 2022-05-29 DIAGNOSIS — R102 Pelvic and perineal pain: Secondary | ICD-10-CM | POA: Diagnosis not present

## 2022-06-13 ENCOUNTER — Encounter: Payer: Self-pay | Admitting: Nurse Practitioner

## 2022-06-13 ENCOUNTER — Other Ambulatory Visit: Payer: Self-pay | Admitting: Nurse Practitioner

## 2022-06-13 MED ORDER — WEGOVY 1 MG/0.5ML ~~LOC~~ SOAJ: 1.0000 mg | SUBCUTANEOUS | 0 refills | Status: DC

## 2022-06-14 MED ORDER — WEGOVY 1.7 MG/0.75ML ~~LOC~~ SOAJ: 1.7000 mg | SUBCUTANEOUS | 0 refills | Status: DC

## 2022-07-18 ENCOUNTER — Other Ambulatory Visit: Payer: Self-pay | Admitting: Nurse Practitioner

## 2022-07-18 MED ORDER — WEGOVY 1.7 MG/0.75ML ~~LOC~~ SOAJ: 1.7000 mg | SUBCUTANEOUS | 0 refills | Status: DC

## 2022-07-21 ENCOUNTER — Other Ambulatory Visit: Payer: Self-pay | Admitting: Nurse Practitioner

## 2022-07-21 NOTE — Telephone Encounter (Signed)
Requested medication (s) are due for refill today:   Yes  Requested medication (s) are on the active medication list:   Yes  Future visit scheduled:   Yes   Last ordered: 04/18/2022 1 ml, 2 refills  Returned because no protocol assigned to this medication.   Requested Prescriptions  Pending Prescriptions Disp Refills   AIMOVIG 70 MG/ML SOAJ [Pharmacy Med Name: AIMOVIG 70 MG/ML AUTOINJECTOR] 1 mL 0    Sig: Inject 70 mg into the skin every 30 (thirty) days.     Off-Protocol Failed - 07/21/2022  9:48 AM      Failed - Medication not assigned to a protocol, review manually.      Passed - Valid encounter within last 12 months    Recent Outpatient Visits           3 months ago Moderate episode of recurrent major depressive disorder (HCC)   [facility] Larae Grooms, NP   6 months ago Chronic migraine without aura without status migrainosus, not intractable   [address] [facility] M[facility]um, Oswaldo Conroy, PA-C   11 months ago Annual physical exam   [facility] Larae Grooms, NP   1 year ago Moderate episode of recurrent major depressive disorder (HCC)   [facility] Larae Grooms, NP   1 year ago Class 2 obesity due to excess calories without serious comorbidity with body mass index (BMI) of 36.0 to 36.9 in adult   [facility], Dorie Rank, NP       Future Appointments             In 2 months Larae Grooms, NP [facility]           Off-Protocol Failed - 07/21/2022  9:48 AM      Failed - Medication not assigned to a protocol, review manually.      Passed - Valid encounter within last 12 months    Recent Outpatient Visits           3 months ago Moderate episode of recurrent major depressive disorder (HCC)   [facility] Larae Grooms, NP   6 months ago Chronic migraine without aura without status migrainosus, not intractable   [address] [facility] M[facility]um, Oswaldo Conroy, PA-C    11 months ago Annual physical exam   [facility] Larae Grooms, NP   1 year ago Moderate episode of recurrent major depressive disorder (HCC)   [facility] Larae Grooms, NP   1 year ago Class 2 obesity due to excess calories without serious comorbidity with body mass index (BMI) of 36.0 to 36.9 in adult   [facility], Dorie Rank, NP       Future Appointments             In 2 months Larae Grooms, NP [facility], [facility]

## 2022-08-11 ENCOUNTER — Other Ambulatory Visit: Payer: Self-pay | Admitting: Nurse Practitioner

## 2022-08-11 ENCOUNTER — Telehealth: Payer: Self-pay | Admitting: Nurse Practitioner

## 2022-08-11 DIAGNOSIS — E559 Vitamin D deficiency, unspecified: Secondary | ICD-10-CM

## 2022-08-11 MED ORDER — WEGOVY 2.4 MG/0.75ML ~~LOC~~ SOAJ: 2.4000 mg | SUBCUTANEOUS | 0 refills | Status: DC

## 2022-08-11 NOTE — Telephone Encounter (Signed)
Patient called and stated that she needs a refill on her Mancel Parsons she states that she has been on the 1.7mg  for a while and it should be for the 2.4 mg. Please advise

## 2022-08-11 NOTE — Telephone Encounter (Signed)
Requested medication (s) are due for refill today: yes  Requested medication (s) are on the active medication list: yes  Last refill:  02/15/22  Future visit scheduled: yes  Notes to clinic:  Unable to refill per protocol, cannot delegate.      Requested Prescriptions  Pending Prescriptions Disp Refills   ARIPiprazole (ABILIFY) 2 MG tablet [Pharmacy Med Name: ARIPIPRAZOLE 2 MG TABLET] 90 tablet 0    Sig: Take 1 tablet (2 mg total) by mouth daily.     Not Delegated - Psychiatry:  Antipsychotics - Second Generation (Atypical) - aripiprazole Failed - 08/11/2022 10:25 AM      Failed - This refill cannot be delegated      Failed - Lipid Panel in normal range within the last 12 months    Cholesterol, Total  Date Value Ref Range Status  08/20/2021 159 100 - 199 mg/dL Final   LDL Chol Calc (NIH)  Date Value Ref Range Status  08/20/2021 92 0 - 99 mg/dL Final   HDL  Date Value Ref Range Status  08/20/2021 41 >39 mg/dL Final   Triglycerides  Date Value Ref Range Status  08/20/2021 147 0 - 149 mg/dL Final         Passed - TSH in normal range and within 360 days    TSH  Date Value Ref Range Status  08/20/2021 1.270 0.450 - 4.500 uIU/mL Final         Passed - Completed PHQ-2 or PHQ-9 in the last 360 days      Passed - Last BP in normal range    BP Readings from Last 1 Encounters:  04/19/22 100/68         Passed - Last Heart Rate in normal range    Pulse Readings from Last 1 Encounters:  04/19/22 80         Passed - Valid encounter within last 6 months    Recent Outpatient Visits           3 months ago Moderate episode of recurrent major depressive disorder ([place])   [facility] Jon Billings, NP   6 months ago Chronic migraine without aura without status migrainosus, not intractable   [facility] Mecum, Erin E, PA-C   11 months ago Annual physical exam   [facility] Jon Billings, NP   1 year ago Moderate episode of  recurrent major depressive disorder ([place])   [place], Karen, NP   1 year ago Class 2 obesity due to excess calories without serious comorbidity with body mass index (BMI) of 36.0 to 36.9 in adult   [place], [place] T, NP       Future Appointments             In 2 months Jon Billings, NP [place], [facility]            Passed - CBC within normal limits and completed in the last 12 months    WBC  Date Value Ref Range Status  08/20/2021 8.6 3.4 - 10.8 x10E3/uL Final   RBC  Date Value Ref Range Status  08/20/2021 4.67 3.77 - 5.28 x10E6/uL Final   Hemoglobin  Date Value Ref Range Status  08/20/2021 14.3 11.1 - 15.9 g/dL Final   Hematocrit  Date Value Ref Range Status  08/20/2021 42.7 34.0 - 46.6 % Final   MCHC  Date Value Ref Range Status  08/20/2021 33.5 31.5 - 35.7 g/dL Final  [facility]  Date Value Ref Range Status  08/20/2021 30.6 26.6 - 33.0 pg Final   MCV  Date Value Ref Range Status  08/20/2021 91 79 - 97 fL Final   No results found for: "PLTCOUNTKUC", "LABPLAT", "POCPLA" RDW  Date Value Ref Range Status  08/20/2021 12.0 11.7 - 15.4 % Final         Passed - CMP within normal limits and completed in the last 12 months    Albumin  Date Value Ref Range Status  04/19/2022 4.3 3.9 - 4.9 g/dL Final   Alkaline Phosphatase  Date Value Ref Range Status  04/19/2022 63 44 - 121 IU/L Final   ALT  Date Value Ref Range Status  04/19/2022 12 0 - 32 IU/L Final   AST  Date Value Ref Range Status  04/19/2022 9 0 - 40 IU/L Final   BUN  Date Value Ref Range Status  04/19/2022 16 6 - 24 mg/dL Final   Calcium  Date Value Ref Range Status  04/19/2022 9.1 8.7 - 10.2 mg/dL Final   CO2  Date Value Ref Range Status  04/19/2022 20 20 - 29 mmol/L Final   Creatinine, Ser  Date Value Ref Range Status  04/19/2022 0.98 0.57 - 1.00 mg/dL Final   Glucose  Date Value Ref Range Status  04/19/2022 101 (H) 70  - 99 mg/dL Final   Potassium  Date Value Ref Range Status  04/19/2022 4.8 3.5 - 5.2 mmol/L Final   Sodium  Date Value Ref Range Status  04/19/2022 140 134 - 144 mmol/L Final   Bilirubin Total  Date Value Ref Range Status  04/19/2022 <0.2 0.0 - 1.2 mg/dL Final   Protein,UA  Date Value Ref Range Status  08/20/2021 1+ (A) Negative/Trace Final   Total Protein  Date Value Ref Range Status  04/19/2022 6.4 6.0 - 8.5 g/dL Final   GFR calc Af Amer  Date Value Ref Range Status  05/11/2020 80 >59 mL/min/1.73 Final    Comment:    **[facility] currently reports eGFR in compliance with the current**   recommendations of the [employer]. [facility] will   update reporting as new guidelines are published from the NKF-ASN   Task force.    eGFR  Date Value Ref Range Status  04/19/2022 73 >59 mL/min/1.73 Final   GFR calc non Af Amer  Date Value Ref Range Status  05/11/2020 69 >59 mL/min/1.73 Final          Vitamin D, Ergocalciferol, 50000 units CAPS [Pharmacy Med Name: VITAMIN D2 1.25MG(50,000 UNIT)] 12 capsule 0    Sig: Take 1 capsule (50,000 Units total) by mouth every 7 (seven) days.     Endocrinology:  Vitamins - Vitamin D Supplementation 2 Failed - 08/11/2022 10:25 AM      Failed - Manual Review: Route requests for 50,000 IU strength to the provider      Failed - Vitamin D in normal range and within 360 days    Vit D, 25-Hydroxy  Date Value Ref Range Status  11/04/2020 37.7 30.0 - 100.0 ng/mL Final    Comment:    Vitamin D deficiency has been defined by the [place] practice guideline as a level of serum 25-OH vitamin D less than 20 ng/mL (1,2). The Endocrine Society went on to further define vitamin D insufficiency as a level between 21 and 29 ng/mL (2). 1. IOM (Institute of Medicine). 2010. Dietary reference    intakes for calcium and D.  [place]: The    [employer]. 2. Holick MF, Binkley [place],  Bischoff-Ferrari HA, et al.    Evaluation, treatment, and prevention of vitamin D    deficiency: an Endocrine Society clinical practice    guideline. JCEM. 2011 Jul; 96(7):1911-30.          Passed - Ca in normal range and within 360 days    Calcium  Date Value Ref Range Status  04/19/2022 9.1 8.7 - 10.2 mg/dL Final         Passed - Valid encounter within last 12 months    Recent Outpatient Visits           3 months ago Moderate episode of recurrent major depressive disorder ([place])   [facility] Jon Billings, NP   6 months ago Chronic migraine without aura without status migrainosus, not intractable   [facility] Mecum, Dani Gobble, PA-C   11 months ago Annual physical exam   [facility] Jon Billings, NP   1 year ago Moderate episode of recurrent major depressive disorder ([place])   [place], Karen, NP   1 year ago Class 2 obesity due to excess calories without serious comorbidity with body mass index (BMI) of 36.0 to 36.9 in adult   [place], [place] T, NP       Future Appointments             In 2 months Jon Billings, NP [place], [facility]            Signed Prescriptions Disp Refills   naproxen (NAPROSYN) 500 MG tablet 180 tablet 0    Sig: Take 1 tablet (500 mg total) by mouth 2 (two) times daily as needed for headache.     Analgesics:  NSAIDS Failed - 08/11/2022 10:25 AM      Failed - Manual Review: Labs are only required if the patient has taken medication for more than 8 weeks.      Passed - Cr in normal range and within 360 days    Creatinine, Ser  Date Value Ref Range Status  04/19/2022 0.98 0.57 - 1.00 mg/dL Final         Passed - HGB in normal range and within 360 days    Hemoglobin  Date Value Ref Range Status  08/20/2021 14.3 11.1 - 15.9 g/dL Final         Passed - PLT in normal range and within 360 days    Platelets  Date Value Ref Range  Status  08/20/2021 306 150 - 450 x10E3/uL Final         Passed - HCT in normal range and within 360 days    Hematocrit  Date Value Ref Range Status  08/20/2021 42.7 34.0 - 46.6 % Final         Passed - eGFR is 30 or above and within 360 days    GFR calc Af Amer  Date Value Ref Range Status  05/11/2020 80 >59 mL/min/1.73 Final    Comment:    **[facility] currently reports eGFR in compliance with the current**   recommendations of the [employer]. [facility] will   update reporting as new guidelines are published from the NKF-ASN   Task force.    GFR calc non Af Amer  Date Value Ref Range Status  05/11/2020 69 >59 mL/min/1.73 Final   eGFR  Date Value Ref Range Status  04/19/2022 73 >59 mL/min/1.73 Final  Passed - Patient is not pregnant      Passed - Valid encounter within last 12 months    Recent Outpatient Visits           3 months ago Moderate episode of recurrent major depressive disorder ([place])   [place], Karen, NP   6 months ago Chronic migraine without aura without status migrainosus, not intractable   [facility] Mecum, Dani Gobble, PA-C   11 months ago Annual physical exam   [facility] Jon Billings, NP   1 year ago Moderate episode of recurrent major depressive disorder ([place])   [place], Karen, NP   1 year ago Class 2 obesity due to excess calories without serious comorbidity with body mass index (BMI) of 36.0 to 36.9 in adult   [place], [place] T, NP       Future Appointments             In 2 months Jon Billings, NP [place], [facility]             pantoprazole (PROTONIX) 40 MG tablet 90 tablet 0    Sig: Take 1 tablet (40 mg total) by mouth daily.     Gastroenterology: Proton Pump Inhibitors Passed - 08/11/2022 10:25 AM      Passed - Valid encounter within last 12 months    Recent Outpatient Visits           3  months ago Moderate episode of recurrent major depressive disorder ([place])   [facility] Jon Billings, NP   6 months ago Chronic migraine without aura without status migrainosus, not intractable   [facility] Mecum, Dani Gobble, PA-C   11 months ago Annual physical exam   [facility] Jon Billings, NP   1 year ago Moderate episode of recurrent major depressive disorder ([place])   [place], Karen, NP   1 year ago Class 2 obesity due to excess calories without serious comorbidity with body mass index (BMI) of 36.0 to 36.9 in adult   [place], [place] T, NP       Future Appointments             In 2 months Jon Billings, NP [place], [facility]             topiramate (TOPAMAX) 200 MG tablet 90 tablet 0    Sig: Take 1 tablet (200 mg total) by mouth daily.     Neurology: Anticonvulsants - topiramate & zonisamide Passed - 08/11/2022 10:25 AM      Passed - Cr in normal range and within 360 days    Creatinine, Ser  Date Value Ref Range Status  04/19/2022 0.98 0.57 - 1.00 mg/dL Final         Passed - CO2 in normal range and within 360 days    CO2  Date Value Ref Range Status  04/19/2022 20 20 - 29 mmol/L Final         Passed - ALT in normal range and within 360 days    ALT  Date Value Ref Range Status  04/19/2022 12 0 - 32 IU/L Final         Passed - AST in normal range and within 360 days    AST  Date Value Ref Range Status  04/19/2022 9 0 - 40 IU/L Final         Passed - Completed  PHQ-2 or PHQ-9 in the last 360 days      Passed - Valid encounter within last 12 months    Recent Outpatient Visits           3 months ago Moderate episode of recurrent major depressive disorder ([place])   [facility] Jon Billings, NP   6 months ago Chronic migraine without aura without status migrainosus, not intractable   [facility] Mecum, Dani Gobble, PA-C   11  months ago Annual physical exam   [facility] Jon Billings, NP   1 year ago Moderate episode of recurrent major depressive disorder ([place])   [place], Karen, NP   1 year ago Class 2 obesity due to excess calories without serious comorbidity with body mass index (BMI) of 36.0 to 36.9 in adult   [place], Sharon T, NP       Future Appointments             In 2 months Jon Billings, NP [place], [facility]             DULoxetine (CYMBALTA) 60 MG capsule 90 capsule 0    Sig: Take 1 capsule (60 mg total) by mouth daily.     Psychiatry: Antidepressants - SNRI - duloxetine Passed - 08/11/2022 10:25 AM      Passed - Cr in normal range and within 360 days    Creatinine, Ser  Date Value Ref Range Status  04/19/2022 0.98 0.57 - 1.00 mg/dL Final         Passed - eGFR is 30 or above and within 360 days    GFR calc Af Amer  Date Value Ref Range Status  05/11/2020 80 >59 mL/min/1.73 Final    Comment:    **[facility] currently reports eGFR in compliance with the current**   recommendations of the [employer]. [facility] will   update reporting as new guidelines are published from the NKF-ASN   Task force.    GFR calc non Af Amer  Date Value Ref Range Status  05/11/2020 69 >59 mL/min/1.73 Final   eGFR  Date Value Ref Range Status  04/19/2022 73 >59 mL/min/1.73 Final         Passed - Completed PHQ-2 or PHQ-9 in the last 360 days      Passed - Last BP in normal range    BP Readings from Last 1 Encounters:  04/19/22 100/68         Passed - Valid encounter within last 6 months    Recent Outpatient Visits           3 months ago Moderate episode of recurrent major depressive disorder ([place])   [facility] Jon Billings, NP   6 months ago Chronic migraine without aura without status migrainosus, not intractable   [facility] Mecum, Dani Gobble, PA-C   11 months ago  Annual physical exam   [facility] Jon Billings, NP   1 year ago Moderate episode of recurrent major depressive disorder ([place])   [place], Karen, NP   1 year ago Class 2 obesity due to excess calories without serious comorbidity with body mass index (BMI) of 36.0 to 36.9 in adult   [facility], Barbaraann Faster, NP       Future Appointments             In 2 months Jon Billings, NP [facility], [place]

## 2022-08-11 NOTE — Telephone Encounter (Signed)
Requested Prescriptions  Pending Prescriptions Disp Refills   naproxen (NAPROSYN) 500 MG tablet [Pharmacy Med Name: NAPROXEN 500 MG TABLET] 180 tablet 0    Sig: Take 1 tablet (500 mg total) by mouth 2 (two) times daily as needed for headache.     Analgesics:  NSAIDS Failed - 08/11/2022 10:25 AM      Failed - Manual Review: Labs are only required if the patient has taken medication for more than 8 weeks.      Passed - Cr in normal range and within 360 days    Creatinine, Ser  Date Value Ref Range Status  04/19/2022 0.98 0.57 - 1.00 mg/dL Final         Passed - HGB in normal range and within 360 days    Hemoglobin  Date Value Ref Range Status  08/20/2021 14.3 11.1 - 15.9 g/dL Final         Passed - PLT in normal range and within 360 days    Platelets  Date Value Ref Range Status  08/20/2021 306 150 - 450 x10E3/uL Final         Passed - HCT in normal range and within 360 days    Hematocrit  Date Value Ref Range Status  08/20/2021 42.7 34.0 - 46.6 % Final         Passed - eGFR is 30 or above and within 360 days    GFR calc Af Amer  Date Value Ref Range Status  05/11/2020 80 >59 mL/min/1.73 Final    Comment:    **[facility] currently reports eGFR in compliance with the current**   recommendations of the [employer]. [facility] will   update reporting as new guidelines are published from the NKF-ASN   Task force.    GFR calc non Af Amer  Date Value Ref Range Status  05/11/2020 69 >59 mL/min/1.73 Final   eGFR  Date Value Ref Range Status  04/19/2022 73 >59 mL/min/1.73 Final         Passed - Patient is not pregnant      Passed - Valid encounter within last 12 months    Recent Outpatient Visits           3 months ago Moderate episode of recurrent major depressive disorder ([place])   [facility] Jon Billings, NP   6 months ago Chronic migraine without aura without status migrainosus, not intractable   [facility] Mecum, Dani Gobble,  PA-C   11 months ago Annual physical exam   [facility] Jon Billings, NP   1 year ago Moderate episode of recurrent major depressive disorder ([place])   [place], Karen, NP   1 year ago Class 2 obesity due to excess calories without serious comorbidity with body mass index (BMI) of 36.0 to 36.9 in adult   [place], [place] T, NP       Future Appointments             In 2 months Jon Billings, NP [place], [facility]             ARIPiprazole (ABILIFY) 2 MG tablet [Pharmacy Med Name: ARIPIPRAZOLE 2 MG TABLET] 90 tablet 0    Sig: Take 1 tablet (2 mg total) by mouth daily.     Not Delegated - Psychiatry:  Antipsychotics - Second Generation (Atypical) - aripiprazole Failed - 08/11/2022 10:25 AM      Failed - This refill cannot be delegated  Failed - Lipid Panel in normal range within the last 12 months    Cholesterol, Total  Date Value Ref Range Status  08/20/2021 159 100 - 199 mg/dL Final   LDL Chol Calc (NIH)  Date Value Ref Range Status  08/20/2021 92 0 - 99 mg/dL Final   HDL  Date Value Ref Range Status  08/20/2021 41 >39 mg/dL Final   Triglycerides  Date Value Ref Range Status  08/20/2021 147 0 - 149 mg/dL Final         Passed - TSH in normal range and within 360 days    TSH  Date Value Ref Range Status  08/20/2021 1.270 0.450 - 4.500 uIU/mL Final         Passed - Completed PHQ-2 or PHQ-9 in the last 360 days      Passed - Last BP in normal range    BP Readings from Last 1 Encounters:  04/19/22 100/68         Passed - Last Heart Rate in normal range    Pulse Readings from Last 1 Encounters:  04/19/22 80         Passed - Valid encounter within last 6 months    Recent Outpatient Visits           3 months ago Moderate episode of recurrent major depressive disorder ([place])   [place], Karen, NP   6 months ago Chronic migraine without aura without  status migrainosus, not intractable   [facility] Mecum, Dani Gobble, PA-C   11 months ago Annual physical exam   [facility] Jon Billings, NP   1 year ago Moderate episode of recurrent major depressive disorder ([place])   [place], Karen, NP   1 year ago Class 2 obesity due to excess calories without serious comorbidity with body mass index (BMI) of 36.0 to 36.9 in adult   [place], [place] T, NP       Future Appointments             In 2 months Jon Billings, NP [place], [facility]            Passed - CBC within normal limits and completed in the last 12 months    WBC  Date Value Ref Range Status  08/20/2021 8.6 3.4 - 10.8 x10E3/uL Final   RBC  Date Value Ref Range Status  08/20/2021 4.67 3.77 - 5.28 x10E6/uL Final   Hemoglobin  Date Value Ref Range Status  08/20/2021 14.3 11.1 - 15.9 g/dL Final   Hematocrit  Date Value Ref Range Status  08/20/2021 42.7 34.0 - 46.6 % Final   MCHC  Date Value Ref Range Status  08/20/2021 33.5 31.5 - 35.7 g/dL Final   [facility]  Date Value Ref Range Status  08/20/2021 30.6 26.6 - 33.0 pg Final   MCV  Date Value Ref Range Status  08/20/2021 91 79 - 97 fL Final   No results found for: "PLTCOUNTKUC", "LABPLAT", "POCPLA" RDW  Date Value Ref Range Status  08/20/2021 12.0 11.7 - 15.4 % Final         Passed - CMP within normal limits and completed in the last 12 months    Albumin  Date Value Ref Range Status  04/19/2022 4.3 3.9 - 4.9 g/dL Final   Alkaline Phosphatase  Date Value Ref Range Status  04/19/2022 63 44 - 121 IU/L Final   ALT  Date Value Ref Range  Status  04/19/2022 12 0 - 32 IU/L Final   AST  Date Value Ref Range Status  04/19/2022 9 0 - 40 IU/L Final   BUN  Date Value Ref Range Status  04/19/2022 16 6 - 24 mg/dL Final   Calcium  Date Value Ref Range Status  04/19/2022 9.1 8.7 - 10.2 mg/dL Final   CO2  Date Value Ref  Range Status  04/19/2022 20 20 - 29 mmol/L Final   Creatinine, Ser  Date Value Ref Range Status  04/19/2022 0.98 0.57 - 1.00 mg/dL Final   Glucose  Date Value Ref Range Status  04/19/2022 101 (H) 70 - 99 mg/dL Final   Potassium  Date Value Ref Range Status  04/19/2022 4.8 3.5 - 5.2 mmol/L Final   Sodium  Date Value Ref Range Status  04/19/2022 140 134 - 144 mmol/L Final   Bilirubin Total  Date Value Ref Range Status  04/19/2022 <0.2 0.0 - 1.2 mg/dL Final   Protein,UA  Date Value Ref Range Status  08/20/2021 1+ (A) Negative/Trace Final   Total Protein  Date Value Ref Range Status  04/19/2022 6.4 6.0 - 8.5 g/dL Final   GFR calc Af Amer  Date Value Ref Range Status  05/11/2020 80 >59 mL/min/1.73 Final    Comment:    **[facility] currently reports eGFR in compliance with the current**   recommendations of the [employer]. [facility] will   update reporting as new guidelines are published from the NKF-ASN   Task force.    eGFR  Date Value Ref Range Status  04/19/2022 73 >59 mL/min/1.73 Final   GFR calc non Af Amer  Date Value Ref Range Status  05/11/2020 69 >59 mL/min/1.73 Final          pantoprazole (PROTONIX) 40 MG tablet [Pharmacy Med Name: PANTOPRAZOLE SOD DR 40 MG TAB] 90 tablet 0    Sig: Take 1 tablet (40 mg total) by mouth daily.     Gastroenterology: Proton Pump Inhibitors Passed - 08/11/2022 10:25 AM      Passed - Valid encounter within last 12 months    Recent Outpatient Visits           3 months ago Moderate episode of recurrent major depressive disorder ([place])   [facility] Jon Billings, NP   6 months ago Chronic migraine without aura without status migrainosus, not intractable   [facility], Dani Gobble, PA-C   11 months ago Annual physical exam   [facility] Jon Billings, NP   1 year ago Moderate episode of recurrent major depressive disorder ([place])   [place], Karen, NP   1 year ago Class 2 obesity due to excess calories without serious comorbidity with body mass index (BMI) of 36.0 to 36.9 in adult   [place], [place] T, NP       Future Appointments             In 2 months Jon Billings, NP [place], [facility]             topiramate (TOPAMAX) 200 MG tablet [Pharmacy Med Name: TOPIRAMATE 200 MG TABLET] 90 tablet 0    Sig: Take 1 tablet (200 mg total) by mouth daily.     Neurology: Anticonvulsants - topiramate & zonisamide Passed - 08/11/2022 10:25 AM      Passed - Cr in normal range and within 360 days    Creatinine, Ser  Date Value Ref Range  Status  04/19/2022 0.98 0.57 - 1.00 mg/dL Final         Passed - CO2 in normal range and within 360 days    CO2  Date Value Ref Range Status  04/19/2022 20 20 - 29 mmol/L Final         Passed - ALT in normal range and within 360 days    ALT  Date Value Ref Range Status  04/19/2022 12 0 - 32 IU/L Final         Passed - AST in normal range and within 360 days    AST  Date Value Ref Range Status  04/19/2022 9 0 - 40 IU/L Final         Passed - Completed PHQ-2 or PHQ-9 in the last 360 days      Passed - Valid encounter within last 12 months    Recent Outpatient Visits           3 months ago Moderate episode of recurrent major depressive disorder ([place])   [place], Karen, NP   6 months ago Chronic migraine without aura without status migrainosus, not intractable   [facility], Dani Gobble, PA-C   11 months ago Annual physical exam   [facility] Jon Billings, NP   1 year ago Moderate episode of recurrent major depressive disorder ([place])   [place], Karen, NP   1 year ago Class 2 obesity due to excess calories without serious comorbidity with body mass index (BMI) of 36.0 to 36.9 in adult   [place], [place] T, NP       Future  Appointments             In 2 months Jon Billings, NP [place], [facility]             DULoxetine (CYMBALTA) 60 MG capsule [Pharmacy Med Name: DULOXETINE HCL DR 60 MG CAP] 90 capsule 0    Sig: Take 1 capsule (60 mg total) by mouth daily.     Psychiatry: Antidepressants - SNRI - duloxetine Passed - 08/11/2022 10:25 AM      Passed - Cr in normal range and within 360 days    Creatinine, Ser  Date Value Ref Range Status  04/19/2022 0.98 0.57 - 1.00 mg/dL Final         Passed - eGFR is 30 or above and within 360 days    GFR calc Af Amer  Date Value Ref Range Status  05/11/2020 80 >59 mL/min/1.73 Final    Comment:    **[facility] currently reports eGFR in compliance with the current**   recommendations of the [employer]. [facility] will   update reporting as new guidelines are published from the NKF-ASN   Task force.    GFR calc non Af Amer  Date Value Ref Range Status  05/11/2020 69 >59 mL/min/1.73 Final   eGFR  Date Value Ref Range Status  04/19/2022 73 >59 mL/min/1.73 Final         Passed - Completed PHQ-2 or PHQ-9 in the last 360 days      Passed - Last BP in normal range    BP Readings from Last 1 Encounters:  04/19/22 100/68         Passed - Valid encounter within last 6 months    Recent Outpatient Visits           3 months ago Moderate episode of recurrent major depressive  disorder [facility])   [facility] Jon Billings, NP   6 months ago Chronic migraine without aura without status migrainosus, not intractable   [facility], Dani Gobble, PA-C   11 months ago Annual physical exam   [facility] Jon Billings, NP   1 year ago Moderate episode of recurrent major depressive disorder ([place])   [facility] Jon Billings, NP   1 year ago Class 2 obesity due to excess calories without serious comorbidity with body mass index (BMI) of 36.0 to 36.9 in adult   [place], [place] T, NP       Future Appointments             In 2 months Jon Billings, NP [place], [facility]             Vitamin D, Ergocalciferol, 50000 units CAPS [Pharmacy Med Name: VITAMIN D2 1.25MG(50,000 UNIT)] 12 capsule 0    Sig: Take 1 capsule (50,000 Units total) by mouth every 7 (seven) days.     Endocrinology:  Vitamins - Vitamin D Supplementation 2 Failed - 08/11/2022 10:25 AM      Failed - Manual Review: Route requests for 50,000 IU strength to the provider      Failed - Vitamin D in normal range and within 360 days    Vit D, 25-Hydroxy  Date Value Ref Range Status  11/04/2020 37.7 30.0 - 100.0 ng/mL Final    Comment:    Vitamin D deficiency has been defined by the [place] practice guideline as a level of serum 25-OH vitamin D less than 20 ng/mL (1,2). The Endocrine Society went on to further define vitamin D insufficiency as a level between 21 and 29 ng/mL (2). 1. IOM (Institute of Medicine). 2010. Dietary reference    intakes for calcium and D. [place]: The    [employer]. 2. Holick MF, Binkley Bonduel, Bischoff-Ferrari HA, et al.    Evaluation, treatment, and prevention of vitamin D    deficiency: an Endocrine Society clinical practice    guideline. JCEM. 2011 Jul; 96(7):1911-30.          Passed - Ca in normal range and within 360 days    Calcium  Date Value Ref Range Status  04/19/2022 9.1 8.7 - 10.2 mg/dL Final         Passed - Valid encounter within last 12 months    Recent Outpatient Visits           3 months ago Moderate episode of recurrent major depressive disorder ([place])   [facility] Jon Billings, NP   6 months ago Chronic migraine without aura without status migrainosus, not intractable   [facility], Dani Gobble, PA-C   11 months ago Annual physical exam   [facility] Jon Billings, NP   1 year ago Moderate episode of  recurrent major depressive disorder ([place])   [place], Karen, NP   1 year ago Class 2 obesity due to excess calories without serious comorbidity with body mass index (BMI) of 36.0 to 36.9 in adult   [facility], Barbaraann Faster, NP       Future Appointments             In 2 months Jon Billings, NP [facility], [place]

## 2022-09-22 ENCOUNTER — Telehealth: Payer: Self-pay | Admitting: Nurse Practitioner

## 2022-09-22 NOTE — Telephone Encounter (Signed)
Pt states that she tried to refill her medication Semaglutide-Weight Management (WEGOVY) 2.4 MG/0.75ML SOAJ  and it is saying she need Prior Authorization.  Please advise   [place], [place] - [place] Phone: [phone]  Fax: [phone]

## 2022-09-23 NOTE — Telephone Encounter (Signed)
PA initiated and submitted via Cover My Meds. Patient aware PA has been started.

## 2022-09-26 NOTE — Telephone Encounter (Signed)
PA approved. Patient notified of approval.

## 2022-10-17 NOTE — Progress Notes (Unsigned)
There were no vitals taken for this visit.   Subjective:    Patient ID: Mia Thompson, female    DOB: 28-Oct-1975, 47 y.o.   MRN: [id number]  HPI: Mia Thompson is a 47 y.o. female presenting on 10/18/2022 for comprehensive medical examination. Current medical complaints include: Weight loss  She currently lives with: Menopausal Symptoms: no  WEIGHT LOSS Patient would like to try [facility] for weight loss.  She has previously gone to the weight loss clinic and lost some weight.  She is having foot and knee pain and feels like it is weight related.   MIGRAINES Patient states her migraines are well controlled with Aimovig.  Denies concerns with the medication and her migraines.   DEPRESSION/ANXIETY Patient feels like her depression and anxiety are also well controlled.  Denies concerns at visit today.   Depression Screen done today and results listed below:     04/19/2022    3:01 PM 01/20/2022    2:54 PM 08/20/2021    3:08 PM 02/17/2021    3:43 PM 11/04/2020    9:05 AM  Depression screen PHQ 2/9  Decreased Interest 0 0 0 0 3  Down, Depressed, Hopeless 0 0 0 0 1  PHQ - 2 Score 0 0 0 0 4  Altered sleeping '2 1 3 3 3  '$ Tired, decreased energy '3 1 3 3 3  '$ Change in appetite '3 1 3 '$ 0 3  Feeling bad or failure about yourself  0 0 0 0 0  Trouble concentrating 0 1 0 0 1  Moving slowly or fidgety/restless 0 0 0 0 0  Suicidal thoughts 0 0 0 0 0  PHQ-9 Score '8 4 9 6 14  '$ Difficult doing work/chores Somewhat difficult Not difficult at all Not difficult at all Not difficult at all     The patient does not have a history of falls. I did complete a risk assessment for falls. A plan of care for falls was documented.   Past Medical History:  Past Medical History:  Diagnosis Date   Anxiety    Depression    GERD (gastroesophageal reflux disease)    Migraines    Migraines    sees Neuro   Prolapse of female bladder, acquired     Surgical History:  Past Surgical History:  Procedure  Laterality Date   IUD REMOVAL N/A 05/08/2017   Procedure: INTRAUTERINE DEVICE (IUD) REMOVAL;  Surgeon: Rubie Maid, MD;  Location: [facility] ORS;  Service: Gynecology;  Laterality: N/A;   LAPAROSCOPIC LYSIS OF ADHESIONS  05/08/2017   Procedure: LAPAROSCOPIC LYSIS OF ADHESIONS;  Surgeon: Rubie Maid, MD;  Location: [facility] ORS;  Service: Gynecology;;   LAPAROSCOPY N/A 05/08/2017   Procedure: LAPAROSCOPY OPERATIVE;  Surgeon: Rubie Maid, MD;  Location: [facility] ORS;  Service: Gynecology;  Laterality: N/A;   THUMB SURGERY Left AGE 81   WISDOM TOOTH EXTRACTION      Medications:  Current Outpatient Medications on File Prior to Visit  Medication Sig   ARIPiprazole (ABILIFY) 2 MG tablet Take 1 tablet (2 mg total) by mouth daily.   baclofen (LIORESAL) 10 MG tablet Take 1 tablet (10 mg total) by mouth 2 (two) times daily as needed for muscle spasms.   cetirizine (ZYRTEC) 10 MG tablet Take 10 mg by mouth at bedtime.   DULoxetine (CYMBALTA) 60 MG capsule Take 1 capsule (60 mg total) by mouth daily.   Erenumab-aooe (AIMOVIG) 70 MG/ML SOAJ Inject 70 mg into the skin every 30 (thirty) days.   Lactobacillus  Probiotic TABS Take 5 Billion Cells by mouth daily.   Levonorgestrel-Ethinyl Estradiol (CAMRESE) 0.15-0.03 &0.01 MG tablet Take 1 tablet by mouth at bedtime.   Multiple Vitamin (MULTI-VITAMINS) TABS Take 1 tablet by mouth daily.    naproxen (NAPROSYN) 500 MG tablet Take 1 tablet (500 mg total) by mouth 2 (two) times daily as needed for headache.   pantoprazole (PROTONIX) 40 MG tablet Take 1 tablet (40 mg total) by mouth daily.   promethazine (PHENERGAN) 25 MG tablet Take 1 tablet (25 mg total) by mouth every 8 (eight) hours as needed for nausea or vomiting.   rizatriptan (MAXALT) 10 MG tablet Take 1 tablet (10 mg total) by mouth as needed for migraine. May repeat in 2 hours if needed   Semaglutide-Weight Management (WEGOVY) 2.4 MG/0.75ML SOAJ Inject 2.4 mg into the skin once a week.   topiramate (TOPAMAX) 200  MG tablet Take 1 tablet (200 mg total) by mouth daily.   Vitamin D, Ergocalciferol, 50000 units CAPS Take 1 capsule (50,000 Units total) by mouth every 7 (seven) days.   No current facility-administered medications on file prior to visit.    Allergies:  Allergies  Allergen Reactions   Vantin [Cefpodoxime] Rash    Shortness of breath    Social History:  Social History   Socioeconomic History   Marital status: Married    Spouse name: Not on file   Number of children: Not on file   Years of education: Not on file   Highest education level: Not on file  Occupational History   Occupation: Pharmacy Tech  Tobacco Use   Smoking status: Former   Smokeless tobacco: Never  Scientific laboratory technician Use: Never used  Substance and Sexual Activity   Alcohol use: Not Currently    Comment: occassional   Drug use: No   Sexual activity: Yes    Birth control/protection: Pill  Other Topics Concern   Not on file  Social History Narrative   Not on file   Social Determinants of Health   Financial Resource Strain: Not on file  Food Insecurity: Not on file  Transportation Needs: Not on file  Physical Activity: Not on file  Stress: Not on file  Social Connections: Not on file  Intimate Partner Violence: Not on file   Social History   Tobacco Use  Smoking Status Former  Smokeless Tobacco Never   Social History   Substance and Sexual Activity  Alcohol Use Not Currently   Comment: occassional    Family History:  Family History  Adopted: Yes  Problem Relation Age of Onset   Breast cancer Neg Hx     Past medical history, surgical history, medications, allergies, family history and social history reviewed with patient today and changes made to appropriate areas of the chart.   Review of Systems  Eyes:  Negative for blurred vision and double vision.  Respiratory:  Negative for shortness of breath.   Cardiovascular:  Negative for chest pain, palpitations and leg swelling.   Neurological:  Negative for dizziness and headaches.  Psychiatric/Behavioral:  Negative for depression and suicidal ideas. The patient is not nervous/anxious.    All other ROS negative except what is listed above and in the HPI.      Objective:    There were no vitals taken for this visit.  Wt Readings from Last 3 Encounters:  04/19/22 220 lb 14.4 oz (100.2 kg)  02/22/22 214 lb 9.6 oz (97.3 kg)  01/20/22 216 lb (98 kg)  Physical Exam Vitals and nursing note reviewed.  Constitutional:      General: She is awake. She is not in acute distress.    Appearance: She is well-developed. She is obese. She is not ill-appearing.  HENT:     Head: Normocephalic and atraumatic.     Right Ear: Hearing, tympanic membrane, ear canal and external ear normal. No drainage.     Left Ear: Hearing, tympanic membrane, ear canal and external ear normal. No drainage.     Nose: Nose normal.     Right Sinus: No maxillary sinus tenderness or frontal sinus tenderness.     Left Sinus: No maxillary sinus tenderness or frontal sinus tenderness.     Mouth/Throat:     Mouth: Mucous membranes are moist.     Pharynx: Oropharynx is clear. Uvula midline. No pharyngeal swelling, oropharyngeal exudate or posterior oropharyngeal erythema.  Eyes:     General: Lids are normal.        Right eye: No discharge.        Left eye: No discharge.     Extraocular Movements: Extraocular movements intact.     Conjunctiva/sclera: Conjunctivae normal.     Pupils: Pupils are equal, round, and reactive to light.     Visual Fields: Right eye visual fields normal and left eye visual fields normal.  Neck:     Thyroid: No thyromegaly.     Vascular: No carotid bruit.     Trachea: Trachea normal.  Cardiovascular:     Rate and Rhythm: Normal rate and regular rhythm.     Heart sounds: Normal heart sounds. No murmur heard.    No gallop.  Pulmonary:     Effort: Pulmonary effort is normal. No accessory muscle usage or respiratory  distress.     Breath sounds: Normal breath sounds.  Chest:  Breasts:    Right: Normal.     Left: Normal.  Abdominal:     General: Bowel sounds are normal.     Palpations: Abdomen is soft. There is no hepatomegaly or splenomegaly.     Tenderness: There is no abdominal tenderness.  Musculoskeletal:        General: Normal range of motion.     Cervical back: Normal range of motion and neck supple.     Right lower leg: No edema.     Left lower leg: No edema.  Lymphadenopathy:     Head:     Right side of head: No submental, submandibular, tonsillar, preauricular or posterior auricular adenopathy.     Left side of head: No submental, submandibular, tonsillar, preauricular or posterior auricular adenopathy.     Cervical: No cervical adenopathy.     Upper Body:     Right upper body: No supraclavicular, axillary or pectoral adenopathy.     Left upper body: No supraclavicular, axillary or pectoral adenopathy.  Skin:    General: Skin is warm and dry.     Capillary Refill: Capillary refill takes less than 2 seconds.     Findings: No rash.  Neurological:     Mental Status: She is alert and oriented to person, place, and time.     Gait: Gait is intact.     Deep Tendon Reflexes: Reflexes are normal and symmetric.     Reflex Scores:      Brachioradialis reflexes are 2+ on the right side and 2+ on the left side.      Patellar reflexes are 2+ on the right side and 2+ on the left side.  Psychiatric:        Attention and Perception: Attention normal.        Mood and Affect: Mood normal.        Speech: Speech normal.        Behavior: Behavior normal. Behavior is cooperative.        Thought Content: Thought content normal.        Judgment: Judgment normal.     Results for orders placed or performed in visit on 04/19/22  Comp Met (CMET)  Result Value Ref Range   Glucose 101 (H) 70 - 99 mg/dL   BUN 16 6 - 24 mg/dL   Creatinine, Ser 0.98 0.57 - 1.00 mg/dL   eGFR 73 >59 mL/min/1.73    BUN/Creatinine Ratio 16 9 - 23   Sodium 140 134 - 144 mmol/L   Potassium 4.8 3.5 - 5.2 mmol/L   Chloride 106 96 - 106 mmol/L   CO2 20 20 - 29 mmol/L   Calcium 9.1 8.7 - 10.2 mg/dL   Total Protein 6.4 6.0 - 8.5 g/dL   Albumin 4.3 3.9 - 4.9 g/dL   Globulin, Total 2.1 1.5 - 4.5 g/dL   Albumin/Globulin Ratio 2.0 1.2 - 2.2   Bilirubin Total <0.2 0.0 - 1.2 mg/dL   Alkaline Phosphatase 63 44 - 121 IU/L   AST 9 0 - 40 IU/L   ALT 12 0 - 32 IU/L  B12  Result Value Ref Range   Vitamin B-12 697 232 - 1,245 pg/mL      Assessment & Plan:   Problem List Items Addressed This Visit       Other   Anxiety, generalized   B12 deficiency   Depression   Class 2 obesity due to excess calories without serious comorbidity with body mass index (BMI) of 36.0 to 36.9 in adult - Primary     Follow up plan: No follow-ups on file.   LABORATORY TESTING:  - Pap smear: up to date  IMMUNIZATIONS:   - Tdap: Tetanus vaccination status reviewed: last tetanus booster within 10 years. - Influenza: Given elsewhere - Pneumovax: NA - Prevnar: NA - COVID: Given elsewhere - HPV: NA - Shingrix vaccine: NA  SCREENING: -Mammogram: Up to date - Colonoscopy: NA - Bone Density: NA -Hearing Test: NA -Spirometry: NA  PATIENT COUNSELING:   Advised to take 1 mg of folate supplement per day if capable of pregnancy.   Sexuality: Discussed sexually transmitted diseases, partner selection, use of condoms, avoidance of unintended pregnancy  and contraceptive alternatives.   Advised to avoid cigarette smoking.  I discussed with the patient that most people either abstain from alcohol or drink within safe limits (<=14/week and <=4 drinks/occasion for males, <=7/weeks and <= 3 drinks/occasion for females) and that the risk for alcohol disorders and other health effects rises proportionally with the number of drinks per week and how often a drinker exceeds daily limits.  Discussed cessation/primary prevention of drug  use and availability of treatment for abuse.   Diet: Encouraged to adjust caloric intake to maintain  or achieve ideal body weight, to reduce intake of dietary saturated fat and total fat, to limit sodium intake by avoiding high sodium foods and not adding table salt, and to maintain adequate dietary potassium and calcium preferably from fresh fruits, vegetables, and low-fat dairy products.    stressed the importance of regular exercise  Injury prevention: Discussed safety belts, safety helmets, smoke detector, smoking near bedding or upholstery.   Dental health: Discussed importance  of regular tooth brushing, flossing, and dental visits.    NEXT PREVENTATIVE PHYSICAL DUE IN 1 YEAR. No follow-ups on file.

## 2022-10-18 ENCOUNTER — Encounter: Payer: Self-pay | Admitting: Nurse Practitioner

## 2022-10-18 ENCOUNTER — Ambulatory Visit: Payer: BC Managed Care – PPO | Admitting: Nurse Practitioner

## 2022-10-18 VITALS — BP 115/70 | HR 94 | Temp 97.4°F | Ht 64.0 in | Wt 186.3 lb

## 2022-10-18 DIAGNOSIS — G43709 Chronic migraine without aura, not intractable, without status migrainosus: Secondary | ICD-10-CM | POA: Diagnosis not present

## 2022-10-18 DIAGNOSIS — Z6836 Body mass index (BMI) 36.0-36.9, adult: Secondary | ICD-10-CM

## 2022-10-18 DIAGNOSIS — E538 Deficiency of other specified B group vitamins: Secondary | ICD-10-CM

## 2022-10-18 DIAGNOSIS — Z0000 Encounter for general adult medical examination without abnormal findings: Secondary | ICD-10-CM | POA: Diagnosis not present

## 2022-10-18 DIAGNOSIS — E559 Vitamin D deficiency, unspecified: Secondary | ICD-10-CM

## 2022-10-18 DIAGNOSIS — R7989 Other specified abnormal findings of blood chemistry: Secondary | ICD-10-CM

## 2022-10-18 DIAGNOSIS — F411 Generalized anxiety disorder: Secondary | ICD-10-CM

## 2022-10-18 DIAGNOSIS — E6609 Other obesity due to excess calories: Secondary | ICD-10-CM | POA: Diagnosis not present

## 2022-10-18 DIAGNOSIS — Z136 Encounter for screening for cardiovascular disorders: Secondary | ICD-10-CM | POA: Diagnosis not present

## 2022-10-18 DIAGNOSIS — F331 Major depressive disorder, recurrent, moderate: Secondary | ICD-10-CM | POA: Diagnosis not present

## 2022-10-18 DIAGNOSIS — E7800 Pure hypercholesterolemia, unspecified: Secondary | ICD-10-CM

## 2022-10-18 DIAGNOSIS — Z1211 Encounter for screening for malignant neoplasm of colon: Secondary | ICD-10-CM

## 2022-10-18 LAB — MICROSCOPIC EXAMINATION

## 2022-10-18 LAB — URINALYSIS, ROUTINE W REFLEX MICROSCOPIC
Glucose, UA: NEGATIVE
Leukocytes,UA: NEGATIVE
Nitrite, UA: NEGATIVE
Specific Gravity, UA: >1.03 — ABNORMAL HIGH (ref 1.005–1.030)
Urobilinogen, Ur: 1 mg/dL (ref 0.2–1.0)
pH, UA: 5.5 (ref 5.0–7.5)

## 2022-10-18 MED ORDER — TOPIRAMATE 200 MG PO TABS: 200.0000 mg | ORAL_TABLET | Freq: Every day | ORAL | 1 refills | Status: DC

## 2022-10-18 MED ORDER — DULOXETINE HCL 60 MG PO CPEP: 60.0000 mg | ORAL_CAPSULE | Freq: Every day | ORAL | 1 refills | Status: DC

## 2022-10-18 MED ORDER — PANTOPRAZOLE SODIUM 40 MG PO TBEC: 40.0000 mg | DELAYED_RELEASE_TABLET | Freq: Every day | ORAL | 1 refills | Status: DC

## 2022-10-18 MED ORDER — ARIPIPRAZOLE 2 MG PO TABS: 2.0000 mg | ORAL_TABLET | Freq: Every day | ORAL | 1 refills | Status: DC

## 2022-10-18 MED ORDER — AIMOVIG 70 MG/ML ~~LOC~~ SOAJ: 70.0000 mg | SUBCUTANEOUS | 5 refills | Status: DC

## 2022-10-18 MED ORDER — WEGOVY 2.4 MG/0.75ML ~~LOC~~ SOAJ: 2.4000 mg | SUBCUTANEOUS | 1 refills | Status: DC

## 2022-10-18 MED ORDER — RIZATRIPTAN BENZOATE 10 MG PO TABS: 10.0000 mg | ORAL_TABLET | ORAL | 2 refills | Status: DC | PRN

## 2022-10-18 NOTE — Assessment & Plan Note (Signed)
Labs ordered at visit today.  Will make recommendations based on lab results.   

## 2022-10-18 NOTE — Assessment & Plan Note (Signed)
Chronic. Doing well with Wegovy.  Has lost about 30lbs since starting Wegovy.   Discussed side effects and benefits of medication during visit today. Follow up in 6 months if medication.  Labs ordered at visit today.

## 2022-10-18 NOTE — Assessment & Plan Note (Signed)
Chronic.  Controlled.  Continue with current medication regimen of Abilify daily.  Refills sent today. Labs ordered today.  Return to clinic in 6 months for reevaluation.  Call sooner if concerns arise.   

## 2022-10-18 NOTE — Assessment & Plan Note (Signed)
Chronic.  Controlled.  Continue with current medication regimen on Aimovig.  Uses Maxalt PRN.  Labs ordered today.  Return to clinic in 6 months for reevaluation.  Call sooner if concerns arise.

## 2022-10-19 LAB — CBC WITH DIFFERENTIAL/PLATELET
Basophils Absolute: 0.1 10*3/uL (ref 0.0–0.2)
Basos: 1 %
EOS (ABSOLUTE): 0.3 10*3/uL (ref 0.0–0.4)
Eos: 3 %
Hematocrit: 42.9 % (ref 34.0–46.6)
Hemoglobin: 14.1 g/dL (ref 11.1–15.9)
Immature Grans (Abs): 0 10*3/uL (ref 0.0–0.1)
Immature Granulocytes: 0 %
Lymphocytes Absolute: 2 10*3/uL (ref 0.7–3.1)
Lymphs: 20 %
MCH: 30.7 pg (ref 26.6–33.0)
MCHC: 32.9 g/dL (ref 31.5–35.7)
MCV: 94 fL (ref 79–97)
Monocytes Absolute: 0.5 10*3/uL (ref 0.1–0.9)
Monocytes: 5 %
Neutrophils Absolute: 7.2 10*3/uL — ABNORMAL HIGH (ref 1.4–7.0)
Neutrophils: 71 %
Platelets: 311 10*3/uL (ref 150–450)
RBC: 4.59 x10E6/uL (ref 3.77–5.28)
RDW: 12.6 % (ref 11.7–15.4)
WBC: 10.1 10*3/uL (ref 3.4–10.8)

## 2022-10-19 LAB — COMPREHENSIVE METABOLIC PANEL
ALT: 10 IU/L (ref 0–32)
AST: 9 IU/L (ref 0–40)
Albumin/Globulin Ratio: 2 (ref 1.2–2.2)
Albumin: 4.3 g/dL (ref 3.9–4.9)
Alkaline Phosphatase: 49 IU/L (ref 44–121)
BUN/Creatinine Ratio: 15 (ref 9–23)
BUN: 18 mg/dL (ref 6–24)
Bilirubin Total: 0.2 mg/dL (ref 0.0–1.2)
CO2: 18 mmol/L — ABNORMAL LOW (ref 20–29)
Calcium: 9.3 mg/dL (ref 8.7–10.2)
Chloride: 107 mmol/L — ABNORMAL HIGH (ref 96–106)
Creatinine, Ser: 1.21 mg/dL — ABNORMAL HIGH (ref 0.57–1.00)
Globulin, Total: 2.1 g/dL (ref 1.5–4.5)
Glucose: 83 mg/dL (ref 70–99)
Potassium: 4.4 mmol/L (ref 3.5–5.2)
Sodium: 140 mmol/L (ref 134–144)
Total Protein: 6.4 g/dL (ref 6.0–8.5)
eGFR: 56 mL/min/{1.73_m2} — ABNORMAL LOW (ref >59)

## 2022-10-19 LAB — LIPID PANEL
Chol/HDL Ratio: 3.9 ratio (ref 0.0–4.4)
Cholesterol, Total: 157 mg/dL (ref 100–199)
HDL: 40 mg/dL (ref >39)
LDL Chol Calc (NIH): 98 mg/dL (ref 0–99)
Triglycerides: 100 mg/dL (ref 0–149)
VLDL Cholesterol Cal: 19 mg/dL (ref 5–40)

## 2022-10-19 LAB — TSH: TSH: 2.75 u[IU]/mL (ref 0.450–4.500)

## 2022-10-19 LAB — VITAMIN D 25 HYDROXY (VIT D DEFICIENCY, FRACTURES): Vit D, 25-Hydroxy: 92.6 ng/mL (ref 30.0–100.0)

## 2022-10-19 LAB — VITAMIN B12: Vitamin B-12: 360 pg/mL (ref 232–1245)

## 2022-10-19 NOTE — Addendum Note (Signed)
Addended by: Jon Billings on: 10/19/2022 08:07 AM   Modules accepted: Orders

## 2022-10-19 NOTE — Progress Notes (Signed)
Hi Mia Thompson. It was nice to see you yesterday.  Your lab work looks good.  Your kidney function did decline slightly.  I recommend drinking plenty of water and coming back for a lab appointment to make sure it goes back to normal in 1 month.  No other concerns at this time. Continue with your current medication regimen.  Follow up as discussed.  Please let me know if you have any questions.

## 2022-11-09 ENCOUNTER — Other Ambulatory Visit: Payer: Self-pay | Admitting: Nurse Practitioner

## 2022-11-09 DIAGNOSIS — E559 Vitamin D deficiency, unspecified: Secondary | ICD-10-CM

## 2022-11-09 NOTE — Telephone Encounter (Signed)
Requested medication (s) are due for refill today: yes  Requested medication (s) are on the active medication list: yes  Last refill:  08/11/22 #12/0  Future visit scheduled: yes  Notes to clinic:  Unable to refill per protocol, cannot delegate.    Requested Prescriptions  Pending Prescriptions Disp Refills   Vitamin D, Ergocalciferol, 50000 units CAPS [Pharmacy Med Name: VITAMIN D2 1.25MG (50,000 UNIT)] 12 capsule 0    Sig: Take 1 capsule (50,000 Units total) by mouth every 7 (seven) days.     Endocrinology:  Vitamins - Vitamin D Supplementation 2 Failed - 11/09/2022  1:33 PM      Failed - Manual Review: Route requests for 50,000 IU strength to the provider      Passed - Ca in normal range and within 360 days    Calcium  Date Value Ref Range Status  10/18/2022 9.3 8.7 - 10.2 mg/dL Final         Passed - Vitamin D in normal range and within 360 days    Vit D, 25-Hydroxy  Date Value Ref Range Status  10/18/2022 92.6 30.0 - 100.0 ng/mL Final    Comment:    Vitamin D deficiency has been defined by the Institute of Medicine and an Endocrine Society practice guideline as a level of serum 25-OH vitamin D less than 20 ng/mL (1,2). The Endocrine Society went on to further define vitamin D insufficiency as a level between 21 and 29 ng/mL (2). 1. IOM (Institute of Medicine). 2010. Dietary reference    intakes for calcium and D. [place]: The    [employer]. 2. Holick MF, Binkley [place], Bischoff-Ferrari HA, et al.    Evaluation, treatment, and prevention of vitamin D    deficiency: an Endocrine Society clinical practice    guideline. JCEM. 2011 Jul; 96(7):1911-30.          Passed - Valid encounter within last 12 months    Recent Outpatient Visits           3 weeks ago Annual physical exam   [place] Jon Billings, NP   6 months ago Moderate episode of recurrent major depressive disorder [facility] [place])   [place], Karen, NP   9 months ago Chronic migraine without aura without status migrainosus, not intractable   [facility] Mecum, Dani Gobble, PA-C   1 year ago Annual physical exam   [place] Jon Billings, NP   1 year ago Moderate episode of recurrent major depressive disorder [facility])   [place] Jon Billings, NP       Future Appointments             In 5 months Mecum, Dani Gobble, PA-C [place], [place]

## 2022-11-10 ENCOUNTER — Telehealth: Payer: Self-pay | Admitting: Nurse Practitioner

## 2022-11-10 NOTE — Telephone Encounter (Signed)
Mychart message sent to patient.

## 2022-11-21 ENCOUNTER — Other Ambulatory Visit: Payer: BC Managed Care – PPO

## 2022-11-21 DIAGNOSIS — R7989 Other specified abnormal findings of blood chemistry: Secondary | ICD-10-CM | POA: Diagnosis not present

## 2022-11-22 LAB — COMPREHENSIVE METABOLIC PANEL
ALT: 21 IU/L (ref 0–32)
AST: 14 IU/L (ref 0–40)
Albumin/Globulin Ratio: 2 (ref 1.2–2.2)
Albumin: 4.3 g/dL (ref 3.9–4.9)
Alkaline Phosphatase: 52 IU/L (ref 44–121)
BUN/Creatinine Ratio: 14 (ref 9–23)
BUN: 17 mg/dL (ref 6–24)
Bilirubin Total: 0.3 mg/dL (ref 0.0–1.2)
CO2: 17 mmol/L — ABNORMAL LOW (ref 20–29)
Calcium: 9.7 mg/dL (ref 8.7–10.2)
Chloride: 109 mmol/L — ABNORMAL HIGH (ref 96–106)
Creatinine, Ser: 1.2 mg/dL — ABNORMAL HIGH (ref 0.57–1.00)
Globulin, Total: 2.1 g/dL (ref 1.5–4.5)
Glucose: 101 mg/dL — ABNORMAL HIGH (ref 70–99)
Potassium: 4.9 mmol/L (ref 3.5–5.2)
Sodium: 142 mmol/L (ref 134–144)
Total Protein: 6.4 g/dL (ref 6.0–8.5)
eGFR: 57 mL/min/{1.73_m2} — ABNORMAL LOW (ref >59)

## 2022-11-23 NOTE — Progress Notes (Signed)
Hi Mia Thompson.  Your kidney function came back abnormal again.  Are you taking any new supplements or increasing your protein intake significantly?  How much water are you drinking daily? I recommend at least 80 ounces of water daily over the next two weeks and lets recheck your kidney function again.

## 2022-11-28 ENCOUNTER — Telehealth: Payer: Self-pay

## 2022-11-28 NOTE — Telephone Encounter (Signed)
PA for Aimovig initiated and submitted via Cover My Meds. Key: [id number]

## 2022-12-01 NOTE — Telephone Encounter (Signed)
PA approved and patient notified.

## 2022-12-06 ENCOUNTER — Other Ambulatory Visit: Payer: BC Managed Care – PPO

## 2022-12-06 DIAGNOSIS — R7989 Other specified abnormal findings of blood chemistry: Secondary | ICD-10-CM

## 2022-12-07 LAB — COMPREHENSIVE METABOLIC PANEL
ALT: 10 IU/L (ref 0–32)
AST: 11 IU/L (ref 0–40)
Albumin/Globulin Ratio: 2 (ref 1.2–2.2)
Albumin: 4.3 g/dL (ref 3.9–4.9)
Alkaline Phosphatase: 51 IU/L (ref 44–121)
BUN/Creatinine Ratio: 17 (ref 9–23)
BUN: 20 mg/dL (ref 6–24)
Bilirubin Total: 0.3 mg/dL (ref 0.0–1.2)
CO2: 19 mmol/L — ABNORMAL LOW (ref 20–29)
Calcium: 9.5 mg/dL (ref 8.7–10.2)
Chloride: 107 mmol/L — ABNORMAL HIGH (ref 96–106)
Creatinine, Ser: 1.19 mg/dL — ABNORMAL HIGH (ref 0.57–1.00)
Globulin, Total: 2.2 g/dL (ref 1.5–4.5)
Glucose: 74 mg/dL (ref 70–99)
Potassium: 4.7 mmol/L (ref 3.5–5.2)
Sodium: 139 mmol/L (ref 134–144)
Total Protein: 6.5 g/dL (ref 6.0–8.5)
eGFR: 57 mL/min/{1.73_m2} — ABNORMAL LOW (ref >59)

## 2022-12-07 NOTE — Progress Notes (Signed)
Hi Mia Thompson.  Your kidney function is still abnormal.  I am going to place a referral for you to see the Nephrologist.  It is only slightly abnormal but given your age and no underlying causes of kidney disease I want this to be looked at further.

## 2023-01-11 DIAGNOSIS — K219 Gastro-esophageal reflux disease without esophagitis: Secondary | ICD-10-CM | POA: Diagnosis not present

## 2023-01-11 DIAGNOSIS — F418 Other specified anxiety disorders: Secondary | ICD-10-CM | POA: Diagnosis not present

## 2023-01-11 DIAGNOSIS — R809 Proteinuria, unspecified: Secondary | ICD-10-CM | POA: Diagnosis not present

## 2023-01-11 DIAGNOSIS — N200 Calculus of kidney: Secondary | ICD-10-CM | POA: Diagnosis not present

## 2023-01-11 DIAGNOSIS — R8290 Unspecified abnormal findings in urine: Secondary | ICD-10-CM | POA: Diagnosis not present

## 2023-01-17 ENCOUNTER — Other Ambulatory Visit: Payer: Self-pay | Admitting: Nephrology

## 2023-01-17 DIAGNOSIS — R809 Proteinuria, unspecified: Secondary | ICD-10-CM

## 2023-01-17 DIAGNOSIS — N200 Calculus of kidney: Secondary | ICD-10-CM

## 2023-01-25 ENCOUNTER — Ambulatory Visit: Payer: BC Managed Care – PPO

## 2023-03-09 ENCOUNTER — Other Ambulatory Visit: Payer: Self-pay

## 2023-03-14 NOTE — Telephone Encounter (Signed)
Called pt and explain what the provider stated, she said that was fine.  She would just wait until her appointment in September.

## 2023-04-19 ENCOUNTER — Encounter: Payer: Self-pay | Admitting: Certified Nurse Midwife

## 2023-04-19 ENCOUNTER — Other Ambulatory Visit (HOSPITAL_COMMUNITY)
Admission: RE | Admit: 2023-04-19 | Discharge: 2023-04-19 | Disposition: A | Discharge status: Home / Self Care | Payer: BC Managed Care – PPO | Source: Ambulatory Visit | Attending: Certified Nurse Midwife | Admitting: Certified Nurse Midwife

## 2023-04-19 ENCOUNTER — Ambulatory Visit (INDEPENDENT_AMBULATORY_CARE_PROVIDER_SITE_OTHER): Payer: BC Managed Care – PPO | Admitting: Certified Nurse Midwife

## 2023-04-19 VITALS — BP 103/70 | HR 89 | Ht 62.0 in | Wt 168.1 lb

## 2023-04-19 DIAGNOSIS — Z1211 Encounter for screening for malignant neoplasm of colon: Secondary | ICD-10-CM

## 2023-04-19 DIAGNOSIS — Z01419 Encounter for gynecological examination (general) (routine) without abnormal findings: Secondary | ICD-10-CM

## 2023-04-19 DIAGNOSIS — Z124 Encounter for screening for malignant neoplasm of cervix: Secondary | ICD-10-CM | POA: Diagnosis not present

## 2023-04-19 DIAGNOSIS — Z1231 Encounter for screening mammogram for malignant neoplasm of breast: Secondary | ICD-10-CM

## 2023-04-19 MED ORDER — LEVONORGEST-ETH ESTRAD 91-DAY 0.15-0.03 &0.01 MG PO TABS: 1.0000 | ORAL_TABLET | Freq: Every day | ORAL | 11 refills | Status: DC

## 2023-04-19 NOTE — Progress Notes (Signed)
GYNECOLOGY ANNUAL PREVENTATIVE CARE ENCOUNTER NOTE  History:     Mia Thompson is a 47 y.o. G42P3003 female here for a routine annual gynecologic exam.  Current complaints: none.   Denies abnormal vaginal bleeding, discharge, pelvic pain, problems with intercourse or other gynecologic concerns.     Social Relationship: Married  Living:Spouse and children  Work: Associate Professor.  Exercise: walking 2 x wk Smoke/Alcohol/drug use: occasional alcohol use. Denies drugs/smoking/vaping.   Gynecologic History No LMP recorded. (Menstrual status: Oral contraceptives). Contraception: OCP (estrogen/progesterone) Last Pap: 02/16/2022. Results were: normal with negative HPV Last mammogram: 04/06/2021. Results were: normal   The pregnancy intention screening data noted above was reviewed. Potential methods of contraception were discussed. The patient elected to proceed with No data recorded.  Obstetric History OB History  Gravida Para Term Preterm AB Living  3 3 3     3   SAB IAB Ectopic Multiple Live Births          3    # Outcome Date GA Lbr Len/2nd Weight Sex Type Anes PTL Lv  3 Term 02/09/10   7 lb 6 oz (3.345 kg) M Vag-Spont  N LIV  2 Term 02/26/04   8 lb 6 oz (3.799 kg) F Vag-Spont  N LIV  1 Term 11/21/00   7 lb 11 oz (3.487 kg) F Vag-Spont  N LIV    Past Medical History:  Diagnosis Date   Anxiety    Depression    GERD (gastroesophageal reflux disease)    Kidney stone    Migraines    Migraines    sees Neuro   Prolapse of female bladder, acquired     Past Surgical History:  Procedure Laterality Date   IUD REMOVAL N/A 05/08/2017   Procedure: INTRAUTERINE DEVICE (IUD) REMOVAL;  Surgeon: Hildred Laser, MD;  Location: [facility] ORS;  Service: Gynecology;  Laterality: N/A;   LAPAROSCOPIC LYSIS OF ADHESIONS  05/08/2017   Procedure: LAPAROSCOPIC LYSIS OF ADHESIONS;  Surgeon: Hildred Laser, MD;  Location: [facility] ORS;  Service: Gynecology;;   LAPAROSCOPY N/A 05/08/2017   Procedure:  LAPAROSCOPY OPERATIVE;  Surgeon: Hildred Laser, MD;  Location: [facility] ORS;  Service: Gynecology;  Laterality: N/A;   THUMB SURGERY Left AGE 27   WISDOM TOOTH EXTRACTION      Current Outpatient Medications on File Prior to Visit  Medication Sig Dispense Refill   ARIPiprazole (ABILIFY) 2 MG tablet Take 1 tablet (2 mg total) by mouth daily. 90 tablet 1   baclofen (LIORESAL) 10 MG tablet Take 1 tablet (10 mg total) by mouth 2 (two) times daily as needed for muscle spasms. 60 tablet 0   cetirizine (ZYRTEC) 10 MG tablet Take 10 mg by mouth at bedtime.     DULoxetine (CYMBALTA) 60 MG capsule Take 1 capsule (60 mg total) by mouth daily. 90 capsule 1   Erenumab-aooe (AIMOVIG) 70 MG/ML SOAJ Inject 70 mg into the skin every 30 (thirty) days. 1 mL 5   Lactobacillus Probiotic TABS Take 5 Billion Cells by mouth daily. 90 tablet 1   Multiple Vitamin (MULTI-VITAMINS) TABS Take 1 tablet by mouth daily.      naproxen (NAPROSYN) 500 MG tablet Take 1 tablet (500 mg total) by mouth 2 (two) times daily as needed for headache. 180 tablet 0   pantoprazole (PROTONIX) 40 MG tablet Take 1 tablet (40 mg total) by mouth daily. 90 tablet 1   promethazine (PHENERGAN) 25 MG tablet Take 1 tablet (25 mg total) by  mouth every 8 (eight) hours as needed for nausea or vomiting. 20 tablet 0   rizatriptan (MAXALT) 10 MG tablet Take 1 tablet (10 mg total) by mouth as needed for migraine. May repeat in 2 hours if needed 10 tablet 2   Semaglutide-Weight Management (WEGOVY) 2.4 MG/0.75ML SOAJ Inject 2.4 mg into the skin once a week. 9 mL 1   topiramate (TOPAMAX) 200 MG tablet Take 1 tablet (200 mg total) by mouth daily. 90 tablet 1   Vitamin D, Ergocalciferol, 50000 units CAPS Take 1 capsule (50,000 Units total) by mouth every 7 (seven) days. 12 capsule 0   No current facility-administered medications on file prior to visit.    Allergies  Allergen Reactions   Vantin [Cefpodoxime] Rash    Shortness of breath    Social History:   reports that she has quit smoking. She has never used smokeless tobacco. She reports that she does not currently use alcohol. She reports that she does not use drugs.  Family History  Adopted: Yes  Problem Relation Age of Onset   Breast cancer Neg Hx     The following portions of the patient's history were reviewed and updated as appropriate: allergies, current medications, past family history, past medical history, past social history, past surgical history and problem list.  Review of Systems Pertinent items noted in HPI and remainder of comprehensive ROS otherwise negative.  Physical Exam:  BP 103/70   Pulse 89   Ht 5\' 2"  (1.575 m)   Wt 168 lb 1.6 oz (76.2 kg)   BMI 30.75 kg/m  CONSTITUTIONAL: Well-developed, well-nourished female in no acute distress.  HENT:  Normocephalic, atraumatic, External right and left ear normal. Oropharynx is clear and moist EYES: Conjunctivae and EOM are normal. Pupils are equal, round, and reactive to light. No scleral icterus.  NECK: Normal range of motion, supple, no masses.  Normal thyroid.  SKIN: Skin is warm and dry. No rash noted. Not diaphoretic. No erythema. No pallor. MUSCULOSKELETAL: Normal range of motion. No tenderness.  No cyanosis, clubbing, or edema.  2+ distal pulses. NEUROLOGIC: Alert and oriented to person, place, and time. Normal reflexes, muscle tone coordination.  PSYCHIATRIC: Normal mood and affect. Normal behavior. Normal judgment and thought content. CARDIOVASCULAR: Normal heart rate noted, regular rhythm RESPIRATORY: Clear to auscultation bilaterally. Effort and breath sounds normal, no problems with respiration noted. BREASTS: Symmetric in size. No masses, tenderness, skin changes, nipple drainage, or lymphadenopathy bilaterally.  ABDOMEN: Soft, no distention noted.  No tenderness, rebound or guarding.  PELVIC: Normal appearing external genitalia and urethral meatus; normal appearing vaginal mucosa and cervix.  No abnormal  discharge noted.  Pap smear obtained.  Contact bleeding Normal uterine size, no other palpable masses, no uterine or adnexal tenderness.  .   Assessment and Plan:    1. Women's annual routine gynecological examination   Pap: Will follow up results of pap smear and manage accordingly. Mammogram : ordered Colonoscopy: coloGuard ordered Labs: none due  Refills: ocp Referral: none  Routine preventative health maintenance measures emphasized. Please refer to After Visit Summary for other counseling recommendations.      Doreene Burke, CNM [facility]  [facility],  [facility]

## 2023-04-19 NOTE — Patient Instructions (Signed)
Preventive Care 40-46 Years Old, Female Preventive care refers to lifestyle choices and visits with your health care provider that can promote health and wellness. Preventive care visits are also called wellness exams. What can I expect for my preventive care visit? Counseling Your health care provider may ask you questions about your: Medical history, including: Past medical problems. Family medical history. Pregnancy history. Current health, including: Menstrual cycle. Method of birth control. Emotional well-being. Home life and relationship well-being. Sexual activity and sexual health. Lifestyle, including: Alcohol, nicotine or tobacco, and drug use. Access to firearms. Diet, exercise, and sleep habits. Work and work environment. Sunscreen use. Safety issues such as seatbelt and bike helmet use. Physical exam Your health care provider will check your: Height and weight. These may be used to calculate your BMI (body mass index). BMI is a measurement that tells if you are at a healthy weight. Waist circumference. This measures the distance around your waistline. This measurement also tells if you are at a healthy weight and may help predict your risk of certain diseases, such as type 2 diabetes and high blood pressure. Heart rate and blood pressure. Body temperature. Skin for abnormal spots. What immunizations do I need?  Vaccines are usually given at various ages, according to a schedule. Your health care provider will recommend vaccines for you based on your age, medical history, and lifestyle or other factors, such as travel or where you work. What tests do I need? Screening Your health care provider may recommend screening tests for certain conditions. This may include: Lipid and cholesterol levels. Diabetes screening. This is done by checking your blood sugar (glucose) after you have not eaten for a while (fasting). Pelvic exam and Pap test. Hepatitis B test. Hepatitis C  test. HIV (human immunodeficiency virus) test. STI (sexually transmitted infection) testing, if you are at risk. Lung cancer screening. Colorectal cancer screening. Mammogram. Talk with your health care provider about when you should start having regular mammograms. This may depend on whether you have a family history of breast cancer. BRCA-related cancer screening. This may be done if you have a family history of breast, ovarian, tubal, or peritoneal cancers. Bone density scan. This is done to screen for osteoporosis. Talk with your health care provider about your test results, treatment options, and if necessary, the need for more tests. Follow these instructions at home: Eating and drinking  Eat a diet that includes fresh fruits and vegetables, whole grains, lean protein, and low-fat dairy products. Take vitamin and mineral supplements as recommended by your health care provider. Do not drink alcohol if: Your health care provider tells you not to drink. You are pregnant, may be pregnant, or are planning to become pregnant. If you drink alcohol: Limit how much you have to 0-1 drink a day. Know how much alcohol is in your drink. In the [place] one drink equals one 12 oz bottle of beer (355 mL), one 5 oz glass of wine (148 mL), or one 1 oz glass of hard liquor (44 mL). Lifestyle Brush your teeth every morning and night with fluoride toothpaste. Floss one time each day. Exercise for at least 30 minutes 5 or more days each week. Do not use any products that contain nicotine or tobacco. These products include cigarettes, chewing tobacco, and vaping devices, such as e-cigarettes. If you need help quitting, ask your health care provider. Do not use drugs. If you are sexually active, practice safe sex. Use a condom or other form of protection to   prevent STIs. If you do not wish to become pregnant, use a form of birth control. If you plan to become pregnant, see your health care provider for a  prepregnancy visit. Take aspirin only as told by your health care provider. Make sure that you understand how much to take and what form to take. Work with your health care provider to find out whether it is safe and beneficial for you to take aspirin daily. Find healthy ways to manage stress, such as: Meditation, yoga, or listening to music. Journaling. Talking to a trusted person. Spending time with friends and family. Minimize exposure to UV radiation to reduce your risk of skin cancer. Safety Always wear your seat belt while driving or riding in a vehicle. Do not drive: If you have been drinking alcohol. Do not ride with someone who has been drinking. When you are tired or distracted. While texting. If you have been using any mind-altering substances or drugs. Wear a helmet and other protective equipment during sports activities. If you have firearms in your house, make sure you follow all gun safety procedures. Seek help if you have been physically or sexually abused. What's next? Visit your health care provider once a year for an annual wellness visit. Ask your health care provider how often you should have your eyes and teeth checked. Stay up to date on all vaccines. This information is not intended to replace advice given to you by your health care provider. Make sure you discuss any questions you have with your health care provider. Document Revised: 01/20/2021 Document Reviewed: 01/20/2021 Elsevier Patient Education  2024 [employer] Inc[employer]  

## 2023-04-20 ENCOUNTER — Ambulatory Visit: Payer: BC Managed Care – PPO | Admitting: Physician Assistant

## 2023-04-20 ENCOUNTER — Other Ambulatory Visit: Payer: Self-pay | Admitting: Certified Nurse Midwife

## 2023-04-20 ENCOUNTER — Telehealth: Payer: Self-pay

## 2023-04-20 ENCOUNTER — Encounter: Payer: Self-pay | Admitting: Physician Assistant

## 2023-04-20 VITALS — BP 92/64 | HR 90 | Ht 62.0 in | Wt 169.0 lb

## 2023-04-20 DIAGNOSIS — E538 Deficiency of other specified B group vitamins: Secondary | ICD-10-CM | POA: Diagnosis not present

## 2023-04-20 DIAGNOSIS — G43709 Chronic migraine without aura, not intractable, without status migrainosus: Secondary | ICD-10-CM | POA: Diagnosis not present

## 2023-04-20 DIAGNOSIS — E6609 Other obesity due to excess calories: Secondary | ICD-10-CM | POA: Diagnosis not present

## 2023-04-20 DIAGNOSIS — K219 Gastro-esophageal reflux disease without esophagitis: Secondary | ICD-10-CM | POA: Diagnosis not present

## 2023-04-20 DIAGNOSIS — F411 Generalized anxiety disorder: Secondary | ICD-10-CM | POA: Diagnosis not present

## 2023-04-20 DIAGNOSIS — Z6836 Body mass index (BMI) 36.0-36.9, adult: Secondary | ICD-10-CM

## 2023-04-20 DIAGNOSIS — F330 Major depressive disorder, recurrent, mild: Secondary | ICD-10-CM

## 2023-04-20 MED ORDER — NAPROXEN 500 MG PO TABS: 500.0000 mg | ORAL_TABLET | Freq: Two times a day (BID) | ORAL | 0 refills | Status: DC | PRN

## 2023-04-20 MED ORDER — DULOXETINE HCL 60 MG PO CPEP: 60.0000 mg | ORAL_CAPSULE | Freq: Every day | ORAL | 1 refills | Status: DC

## 2023-04-20 MED ORDER — RIZATRIPTAN BENZOATE 10 MG PO TABS: 10.0000 mg | ORAL_TABLET | ORAL | 2 refills | Status: DC | PRN

## 2023-04-20 MED ORDER — AIMOVIG 70 MG/ML ~~LOC~~ SOAJ: 70.0000 mg | SUBCUTANEOUS | 5 refills | Status: DC

## 2023-04-20 MED ORDER — PROMETHAZINE HCL 25 MG PO TABS: 25.0000 mg | ORAL_TABLET | Freq: Three times a day (TID) | ORAL | 0 refills | Status: DC | PRN

## 2023-04-20 MED ORDER — BACLOFEN 10 MG PO TABS: 10.0000 mg | ORAL_TABLET | Freq: Two times a day (BID) | ORAL | 0 refills | Status: DC | PRN

## 2023-04-20 MED ORDER — WEGOVY 2.4 MG/0.75ML ~~LOC~~ SOAJ: 2.4000 mg | SUBCUTANEOUS | 1 refills | Status: DC

## 2023-04-20 MED ORDER — TOPIRAMATE 200 MG PO TABS: 200.0000 mg | ORAL_TABLET | Freq: Every day | ORAL | 1 refills | Status: DC

## 2023-04-20 MED ORDER — PANTOPRAZOLE SODIUM 40 MG PO TBEC: 40.0000 mg | DELAYED_RELEASE_TABLET | Freq: Every day | ORAL | 1 refills | Status: DC

## 2023-04-20 MED ORDER — LEVONORGEST-ETH ESTRAD 91-DAY 0.15-0.03 &0.01 MG PO TABS: 1.0000 | ORAL_TABLET | Freq: Every day | ORAL | 3 refills | Status: DC

## 2023-04-20 MED ORDER — ARIPIPRAZOLE 2 MG PO TABS: 2.0000 mg | ORAL_TABLET | Freq: Every day | ORAL | 1 refills | Status: DC

## 2023-04-20 NOTE — Assessment & Plan Note (Signed)
Chronic, controlled She reports 2-3 headache days per month that seem to resolve with Naproxen and baclofen  She reports rarely needing to use her maxalt  She has been taking Amovig 70 mg injection every month along with Topamax 200 mg PO every day for prevention Continue with current regimen- recommend she tries to avoid NSAIDs due to reduced kidney function  Follow up in 6 months or sooner if concerns arise

## 2023-04-20 NOTE — Progress Notes (Signed)
Established Patient Office Visit  Name: Mia Thompson   MRN: [id number]    DOB: 02-08-1976   Date:04/20/2023  Today's Provider: Jacquelin Hawking, MHS, PA-C Introduced myself to the patient as a PA-C and provided education on APPs in clinical practice.         Subjective  Chief Complaint  Chief Complaint  Patient presents with   Headache   Obesity   Depression    HPI   Depression/Anxiety  She reports her mood has been pretty good  She is taking Duloxetine and Abilify and reports good control of symptoms    Obesity   She is still taking Wegovy 2.4 mg weekly injection  She has lost about 60 lbs since she started last year  Her goal is to get to 135 lbs  She is exercising a bit- she is trying to walk and do exercises at home. She is trying to do stretches and chair workouts  Diet: she feels like she is getting a balanced diet with protein, carbs, and vegetables    Vitamin D deficiency/ B12 Deficiency  Vitamin D was 92.6 in March  She has not been taking Vitamin D supplement since last apt She has been taking B12 OTC supplement    Migraine She reports her migraines are doing okay  Thinks she is having 2-3 migraines per month She reports they usually resolve with naproxen and muscle relaxer. She does have Maxalt on hand but states it makes her sleepy and her headaches have been responding to other medications She is still taking Amovig as directed. She denies side effects or concerns    GERD Reports good control on her current medications       04/20/2023    8:57 AM 10/18/2022    9:40 AM 04/19/2022    3:01 PM 01/20/2022    2:54 PM 08/20/2021    3:08 PM  Depression screen PHQ 2/9  Decreased Interest 0 0 0 0 0  Down, Depressed, Hopeless 0 0 0 0 0  PHQ - 2 Score 0 0 0 0 0  Altered sleeping 1 1 2 1 3   Tired, decreased energy 1 1 3 1 3   Change in appetite 0 0 3 1 3   Feeling bad or failure about yourself  0 0 0 0 0  Trouble concentrating 1 1 0 1 0  Moving  slowly or fidgety/restless 0 0 0 0 0  Suicidal thoughts 0 0 0 0 0  PHQ-9 Score 3 3 8 4 9   Difficult doing work/chores Not difficult at all Not difficult at all Somewhat difficult Not difficult at all Not difficult at all       04/20/2023    8:57 AM 10/18/2022    9:40 AM 01/20/2022    2:54 PM 08/20/2021    3:07 PM  GAD 7 : Generalized Anxiety Score  Nervous, Anxious, on Edge 0 0 0 0  Control/stop worrying 0 0 0 0  Worry too much - different things 0 0 0 0  Trouble relaxing 0 0 0 0  Restless 0 0 0 0  Easily annoyed or irritable 0 0 0 0  Afraid - awful might happen 0 0 0 0  Total GAD 7 Score 0 0 0 0  Anxiety Difficulty Not difficult at all Not difficult at all Not difficult at all Not difficult at all       Patient Active Problem List   Diagnosis Date Noted   Vitamin  D deficiency 10/18/2022   Class 2 obesity due to excess calories without serious comorbidity with body mass index (BMI) of 36.0 to 36.9 in adult 05/11/2020   Insomnia 12/19/2019   Depression    GERD (gastroesophageal reflux disease)    B12 deficiency 04/16/2017   Migraine 03/10/2016   Allergic rhinitis 03/10/2016   Anxiety, generalized 03/10/2016    Past Surgical History:  Procedure Laterality Date   IUD REMOVAL N/A 05/08/2017   Procedure: INTRAUTERINE DEVICE (IUD) REMOVAL;  Surgeon: Hildred Laser, MD;  Location: [facility] ORS;  Service: Gynecology;  Laterality: N/A;   LAPAROSCOPIC LYSIS OF ADHESIONS  05/08/2017   Procedure: LAPAROSCOPIC LYSIS OF ADHESIONS;  Surgeon: Hildred Laser, MD;  Location: [facility] OR[facility];  Service: Gynecology;;   LAPAROSCOPY N/A 05/08/2017   Procedure: LAPAROSCOPY OPERATIVE;  Surgeon: Hildred Laser, MD;  Location: [facility];  Service: Gynecology;  Laterality: N/A;   THUMB SURGERY Left AGE 77   WISDOM TOOTH EXTRACTION      Family History  Adopted: Yes  Problem Relation Age of Onset   Breast cancer Neg Hx     Social History   Tobacco Use   Smoking status: Former   Smokeless tobacco: Never   Substance Use Topics   Alcohol use: Not Currently    Comment: occassional     Current Outpatient Medications:    cetirizine (ZYRTEC) 10 MG tablet, Take 10 mg by mouth at bedtime., Disp: , Rfl:    Lactobacillus Probiotic TABS, Take 5 Billion Cells by mouth daily., Disp: 90 tablet, Rfl: 1   Levonorgestrel-Ethinyl Estradiol (CAMRESE) 0.15-0.03 &0.01 MG tablet, Take 1 tablet by mouth at bedtime., Disp: 28 tablet, Rfl: 11   Multiple Vitamin (MULTI-VITAMINS) TABS, Take 1 tablet by mouth daily. , Disp: , Rfl:    ARIPiprazole (ABILIFY) 2 MG tablet, Take 1 tablet (2 mg total) by mouth daily., Disp: 90 tablet, Rfl: 1   baclofen (LIORESAL) 10 MG tablet, Take 1 tablet (10 mg total) by mouth 2 (two) times daily as needed for muscle spasms., Disp: 60 tablet, Rfl: 0   DULoxetine (CYMBALTA) 60 MG capsule, Take 1 capsule (60 mg total) by mouth daily., Disp: 90 capsule, Rfl: 1   Erenumab-aooe (AIMOVIG) 70 MG/ML SOAJ, Inject 70 mg into the skin every 30 (thirty) days., Disp: 1 mL, Rfl: 5   naproxen (NAPROSYN) 500 MG tablet, Take 1 tablet (500 mg total) by mouth 2 (two) times daily as needed for headache., Disp: 180 tablet, Rfl: 0   pantoprazole (PROTONIX) 40 MG tablet, Take 1 tablet (40 mg total) by mouth daily., Disp: 90 tablet, Rfl: 1   promethazine (PHENERGAN) 25 MG tablet, Take 1 tablet (25 mg total) by mouth every 8 (eight) hours as needed for nausea or vomiting., Disp: 20 tablet, Rfl: 0   rizatriptan (MAXALT) 10 MG tablet, Take 1 tablet (10 mg total) by mouth as needed for migraine. May repeat in 2 hours if needed, Disp: 10 tablet, Rfl: 2   Semaglutide-Weight Management (WEGOVY) 2.4 MG/0.75ML SOAJ, Inject 2.4 mg into the skin once a week., Disp: 9 mL, Rfl: 1   topiramate (TOPAMAX) 200 MG tablet, Take 1 tablet (200 mg total) by mouth daily., Disp: 90 tablet, Rfl: 1   Vitamin D, Ergocalciferol, 50000 units CAPS, Take 1 capsule (50,000 Units total) by mouth every 7 (seven) days. (Patient not taking: Reported  on 04/20/2023), Disp: 12 capsule, Rfl: 0  Allergies  Allergen Reactions   Vantin [Cefpodoxime] Rash    Shortness of breath    I  personally reviewed active problem list, medication list, allergies, health maintenance, notes from last encounter, lab results with the patient/caregiver today.   Review of Systems  Eyes:  Negative for blurred vision, double vision and photophobia.  Respiratory:  Negative for shortness of breath.   Cardiovascular:  Negative for chest pain, palpitations and leg swelling.  Gastrointestinal:  Positive for heartburn and nausea. Negative for constipation, diarrhea and vomiting.  Neurological:  Positive for headaches. Negative for dizziness and tingling.  Psychiatric/Behavioral:  Negative for depression. The patient is not nervous/anxious.       Objective  Vitals:   04/20/23 0849  BP: 92/64  Pulse: 90  SpO2: 98%  Weight: 169 lb (76.7 kg)  Height: 5\' 2"  (1.575 m)    Body mass index is 30.91 kg/m.  Physical Exam Vitals reviewed.  Constitutional:      General: She is awake.     Appearance: Normal appearance. She is well-developed and well-groomed.  HENT:     Head: Normocephalic and atraumatic.  Cardiovascular:     Rate and Rhythm: Normal rate and regular rhythm.     Pulses: Normal pulses.          Radial pulses are 2+ on the right side and 2+ on the left side.     Heart sounds: Normal heart sounds. No murmur heard.    No friction rub. No gallop.  Pulmonary:     Effort: Pulmonary effort is normal.     Breath sounds: Normal breath sounds. No decreased air movement. No decreased breath sounds, wheezing, rhonchi or rales.  Musculoskeletal:     Cervical back: Normal range of motion.     Right lower leg: No edema.     Left lower leg: No edema.  Neurological:     Mental Status: She is alert.  Psychiatric:        Behavior: Behavior is cooperative.      No results found for this or any previous visit (from the past 2160  hour(s)).   PHQ2/9:    04/20/2023    8:57 AM 10/18/2022    9:40 AM 04/19/2022    3:01 PM 01/20/2022    2:54 PM 08/20/2021    3:08 PM  Depression screen PHQ 2/9  Decreased Interest 0 0 0 0 0  Down, Depressed, Hopeless 0 0 0 0 0  PHQ - 2 Score 0 0 0 0 0  Altered sleeping 1 1 2 1 3   Tired, decreased energy 1 1 3 1 3   Change in appetite 0 0 3 1 3   Feeling bad or failure about yourself  0 0 0 0 0  Trouble concentrating 1 1 0 1 0  Moving slowly or fidgety/restless 0 0 0 0 0  Suicidal thoughts 0 0 0 0 0  PHQ-9 Score 3 3 8 4 9   Difficult doing work/chores Not difficult at all Not difficult at all Somewhat difficult Not difficult at all Not difficult at all      Fall Risk:    04/20/2023    8:56 AM 10/18/2022    9:38 AM 04/19/2022    3:01 PM 01/20/2022    2:54 PM 08/20/2021    3:07 PM  Fall Risk   Falls in the past year? 0 0 0 0 0  Number falls in past yr: 0 0 0 0 0  Injury with Fall? 0 0 0 0 0  Risk for fall due to : No Fall Risks No Fall Risks No Fall Risks No Fall Risks  No Fall Risks  Follow up Falls evaluation completed Falls evaluation completed Falls evaluation completed Falls evaluation completed Falls evaluation completed      Functional Status Survey:      Assessment & Plan  Problem List Items Addressed This Visit       Cardiovascular and Mediastinum   Migraine    Chronic, controlled She reports 2-3 headache days per month that seem to resolve with Naproxen and baclofen  She reports rarely needing to use her maxalt  She has been taking Amovig 70 mg injection every month along with Topamax 200 mg PO every day for prevention Continue with current regimen- recommend she tries to avoid NSAIDs due to reduced kidney function  Follow up in 6 months or sooner if concerns arise       Relevant Medications   topiramate (TOPAMAX) 200 MG tablet   rizatriptan (MAXALT) 10 MG tablet   naproxen (NAPROSYN) 500 MG tablet   Erenumab-aooe (AIMOVIG) 70 MG/ML SOAJ   DULoxetine  (CYMBALTA) 60 MG capsule   baclofen (LIORESAL) 10 MG tablet     Digestive   GERD (gastroesophageal reflux disease) - Primary    Chronic, historic condition Appears well controlled with current regimen of pantoprazole 40 mg PO every day  Continue current regimen Follow up in 6 months or sooner if concerns arise        Relevant Medications   pantoprazole (PROTONIX) 40 MG tablet     Other   Anxiety, generalized    Chronic, historic condition, appears controlled at this time She is taking Duloxetine 60 mg PO every day and Abilify 2 mg PO every day - she denies concerns for side effects or mood issues today Reviewed PHQ9 and GAD7 results- appear low to mild and stable with previous  Continue current regimen for now Follow up in 6 months or sooner if concerns arise       Relevant Medications   DULoxetine (CYMBALTA) 60 MG capsule   B12 deficiency    Recheck labs today- results to dictate further management She is taking OTC B12 supplement at this time - may need to adjust pending labs today       Relevant Orders   B12   Depression    Chronic, historic condition, appears controlled at this time She is taking Duloxetine 60 mg PO every day and Abilify 2 mg PO every day - she denies concerns for side effects or mood issues today Reviewed PHQ9 and GAD7 results- appear low to mild and stable with previous  Continue current regimen for now Follow up in 6 months or sooner if concerns arise        Relevant Medications   DULoxetine (CYMBALTA) 60 MG capsule   Class 2 obesity due to excess calories without serious comorbidity with body mass index (BMI) of 36.0 to 36.9 in adult    Chronic, appears improved  She is taking Wegovy 2.4 mg weekly injection and reports she is tolerating well  She is trying to walk daily and is doing home exercises several times per week  She feels she is getting balanced diet  She has lost 17 lbs since March and has goals to lose about 30 lbs more  Reviewed  keeping her diet well balanced Refills provided, continue current regimen Follow up in 6 months or sooner if concerns arise        Relevant Medications   Semaglutide-Weight Management (WEGOVY) 2.4 MG/0.75ML SOAJ   Other Relevant Orders   Comp Met (CMET)  CBC w/Diff   Lipid Profile   TSH     Return in about 6 months (around 10/18/2023) for Annual physical, Depression, anxiety, obesity, CKD?Maurie Boettcher Hector Venne, PA-C, have reviewed all documentation for this visit. The documentation on 04/20/23 for the exam, diagnosis, procedures, and orders are all accurate and complete.   Jacquelin Hawking, MHS, PA-C [facility]

## 2023-04-20 NOTE — Assessment & Plan Note (Signed)
Chronic, appears improved  She is taking Wegovy 2.4 mg weekly injection and reports she is tolerating well  She is trying to walk daily and is doing home exercises several times per week  She feels she is getting balanced diet  She has lost 17 lbs since March and has goals to lose about 30 lbs more  Reviewed keeping her diet well balanced Refills provided, continue current regimen Follow up in 6 months or sooner if concerns arise

## 2023-04-20 NOTE — Assessment & Plan Note (Signed)
Chronic, historic condition Appears well controlled with current regimen of pantoprazole 40 mg PO every day  Continue current regimen Follow up in 6 months or sooner if concerns arise

## 2023-04-20 NOTE — Assessment & Plan Note (Signed)
Chronic, historic condition, appears controlled at this time She is taking Duloxetine 60 mg PO every day and Abilify 2 mg PO every day - she denies concerns for side effects or mood issues today Reviewed PHQ9 and GAD7 results- appear low to mild and stable with previous  Continue current regimen for now Follow up in 6 months or sooner if concerns arise

## 2023-04-20 NOTE — Assessment & Plan Note (Signed)
Recheck labs today- results to dictate further management She is taking OTC B12 supplement at this time - may need to adjust pending labs today

## 2023-04-21 LAB — COMPREHENSIVE METABOLIC PANEL
ALT: 8 IU/L (ref 0–32)
AST: 10 IU/L (ref 0–40)
Albumin: 4.1 g/dL (ref 3.9–4.9)
Alkaline Phosphatase: 48 IU/L (ref 44–121)
BUN/Creatinine Ratio: 15 (ref 9–23)
BUN: 17 mg/dL (ref 6–24)
Bilirubin Total: 0.3 mg/dL (ref 0.0–1.2)
CO2: 18 mmol/L — ABNORMAL LOW (ref 20–29)
Calcium: 9.3 mg/dL (ref 8.7–10.2)
Chloride: 108 mmol/L — ABNORMAL HIGH (ref 96–106)
Creatinine, Ser: 1.12 mg/dL — ABNORMAL HIGH (ref 0.57–1.00)
Globulin, Total: 1.9 g/dL (ref 1.5–4.5)
Glucose: 89 mg/dL (ref 70–99)
Potassium: 4.9 mmol/L (ref 3.5–5.2)
Sodium: 141 mmol/L (ref 134–144)
Total Protein: 6 g/dL (ref 6.0–8.5)
eGFR: 61 mL/min/{1.73_m2} (ref >59)

## 2023-04-21 LAB — CBC WITH DIFFERENTIAL/PLATELET
Basophils Absolute: 0 10*3/uL (ref 0.0–0.2)
Basos: 0 %
EOS (ABSOLUTE): 0.2 10*3/uL (ref 0.0–0.4)
Eos: 2 %
Hematocrit: 42.2 % (ref 34.0–46.6)
Hemoglobin: 13.5 g/dL (ref 11.1–15.9)
Immature Grans (Abs): 0 10*3/uL (ref 0.0–0.1)
Immature Granulocytes: 0 %
Lymphocytes Absolute: 1.9 10*3/uL (ref 0.7–3.1)
Lymphs: 20 %
MCH: 30.8 pg (ref 26.6–33.0)
MCHC: 32 g/dL (ref 31.5–35.7)
MCV: 96 fL (ref 79–97)
Monocytes Absolute: 0.4 10*3/uL (ref 0.1–0.9)
Monocytes: 5 %
Neutrophils Absolute: 6.8 10*3/uL (ref 1.4–7.0)
Neutrophils: 73 %
Platelets: 312 10*3/uL (ref 150–450)
RBC: 4.38 x10E6/uL (ref 3.77–5.28)
RDW: 12.1 % (ref 11.7–15.4)
WBC: 9.4 10*3/uL (ref 3.4–10.8)

## 2023-04-21 LAB — CYTOLOGY - PAP
Adequacy: ABSENT
Comment: NEGATIVE
Diagnosis: NEGATIVE
High risk HPV: NEGATIVE

## 2023-04-21 LAB — VITAMIN B12: Vitamin B-12: 367 pg/mL (ref 232–1245)

## 2023-04-21 LAB — LIPID PANEL
Chol/HDL Ratio: 4.3 ratio (ref 0.0–4.4)
Cholesterol, Total: 175 mg/dL (ref 100–199)
HDL: 41 mg/dL (ref >39)
LDL Chol Calc (NIH): 115 mg/dL — ABNORMAL HIGH (ref 0–99)
Triglycerides: 102 mg/dL (ref 0–149)
VLDL Cholesterol Cal: 19 mg/dL (ref 5–40)

## 2023-04-21 LAB — TSH: TSH: 1.55 u[IU]/mL (ref 0.450–4.500)

## 2023-04-21 NOTE — Progress Notes (Signed)
Your labs are back Your electrolytes, liver and kidney function were overall normal and stable  at this time Your cholesterol looks good  Your CBC was normal- no signs of anemia  Your thyroid testing was normal  Your B12 was in normal range  Please continue with your current regimen and let us know if you have any concerns

## 2023-05-08 DIAGNOSIS — Z1211 Encounter for screening for malignant neoplasm of colon: Secondary | ICD-10-CM | POA: Diagnosis not present

## 2023-05-15 LAB — COLOGUARD: COLOGUARD: NEGATIVE

## 2023-07-24 ENCOUNTER — Telehealth: Payer: Self-pay

## 2023-07-24 ENCOUNTER — Ambulatory Visit: Payer: BC Managed Care – PPO | Admitting: Nurse Practitioner

## 2023-07-24 ENCOUNTER — Encounter: Payer: Self-pay | Admitting: Nurse Practitioner

## 2023-07-24 VITALS — Ht 62.0 in | Wt 164.4 lb

## 2023-07-24 DIAGNOSIS — R300 Dysuria: Secondary | ICD-10-CM | POA: Diagnosis not present

## 2023-07-24 DIAGNOSIS — N3001 Acute cystitis with hematuria: Secondary | ICD-10-CM | POA: Diagnosis not present

## 2023-07-24 DIAGNOSIS — Z1231 Encounter for screening mammogram for malignant neoplasm of breast: Secondary | ICD-10-CM | POA: Diagnosis not present

## 2023-07-24 LAB — URINALYSIS, ROUTINE W REFLEX MICROSCOPIC
Bilirubin, UA: NEGATIVE
Glucose, UA: NEGATIVE
Ketones, UA: NEGATIVE
Nitrite, UA: POSITIVE — AB
Specific Gravity, UA: >1.03 — ABNORMAL HIGH (ref 1.005–1.030)
Urobilinogen, Ur: 1 mg/dL (ref 0.2–1.0)
pH, UA: 6 (ref 5.0–7.5)

## 2023-07-24 LAB — MICROSCOPIC EXAMINATION

## 2023-07-24 MED ORDER — NITROFURANTOIN MONOHYD MACRO 100 MG PO CAPS: 100.0000 mg | ORAL_CAPSULE | Freq: Two times a day (BID) | ORAL | 0 refills | Status: DC

## 2023-07-24 NOTE — Telephone Encounter (Signed)
Patient is scheduled for Screening Mammogram on January 2nd at 2:20 PM at [facility] at [place] location. Patient will be notified via MyChart.

## 2023-07-24 NOTE — Progress Notes (Signed)
Ht 5\' 2"  (1.575 m)   Wt 164 lb 6.4 oz (74.6 kg)   BMI 30.07 kg/m    Subjective:    Patient ID: Mia Thompson, female    DOB: [birth year], 47 y.o.   MRN: [id number]  HPI: Mia Thompson is a 47 y.o. female  Chief Complaint  Patient presents with   Urinary Tract Infection    Started yesterday    URINARY SYMPTOMS Symptoms started yesterday Dysuria: yes Urinary frequency: yes Urgency: yes Small volume voids: yes Symptom severity: no Urinary incontinence: no Foul odor: yes Hematuria: no Abdominal pain: yes Back pain: yes Suprapubic pain/pressure: yes Flank pain: no Fever:  no Vomiting: no Relief with cranberry juice: no Relief with pyridium: no Status: worse Previous urinary tract infection: no Recurrent urinary tract infection: no Sexual activity: No sexually   Relevant past medical, surgical, family and social history reviewed and updated as indicated. Interim medical history since our last visit reviewed. Allergies and medications reviewed and updated.  Review of Systems  Constitutional:  Negative for fever.  Gastrointestinal:  Negative for abdominal pain and vomiting.  Genitourinary:  Positive for decreased urine volume, dysuria, frequency and urgency. Negative for flank pain and hematuria.  Musculoskeletal:  Negative for back pain.    Per HPI unless specifically indicated above     Objective:    Ht 5\' 2"  (1.575 m)   Wt 164 lb 6.4 oz (74.6 kg)   BMI 30.07 kg/m   Wt Readings from Last 3 Encounters:  07/24/23 164 lb 6.4 oz (74.6 kg)  04/20/23 169 lb (76.7 kg)  04/19/23 168 lb 1.6 oz (76.2 kg)    Physical Exam Vitals and nursing note reviewed.  Constitutional:      General: She is not in acute distress.    Appearance: Normal appearance. She is normal weight. She is not ill-appearing, toxic-appearing or diaphoretic.  HENT:     Head: Normocephalic.     Right Ear: External ear normal.     Left Ear: External ear normal.     Nose: Nose normal.      Mouth/Throat:     Mouth: Mucous membranes are moist.     Pharynx: Oropharynx is clear.  Eyes:     General:        Right eye: No discharge.        Left eye: No discharge.     Extraocular Movements: Extraocular movements intact.     Conjunctiva/sclera: Conjunctivae normal.     Pupils: Pupils are equal, round, and reactive to light.  Cardiovascular:     Rate and Rhythm: Normal rate and regular rhythm.     Heart sounds: No murmur heard. Pulmonary:     Effort: Pulmonary effort is normal. No respiratory distress.     Breath sounds: Normal breath sounds. No wheezing or rales.  Abdominal:     General: Abdomen is flat. Bowel sounds are normal. There is no distension.     Palpations: Abdomen is soft.     Tenderness: There is abdominal tenderness. There is no right CVA tenderness, left CVA tenderness or guarding.  Musculoskeletal:     Cervical back: Normal range of motion and neck supple.  Skin:    General: Skin is warm and dry.     Capillary Refill: Capillary refill takes less than 2 seconds.  Neurological:     General: No focal deficit present.     Mental Status: She is alert and oriented to person, place, and time. Mental status is at  baseline.  Psychiatric:        Mood and Affect: Mood normal.        Behavior: Behavior normal.        Thought Content: Thought content normal.        Judgment: Judgment normal.     Results for orders placed or performed in visit on 04/20/23  Comp Met (CMET)   Collection Time: 04/20/23  9:35 AM  Result Value Ref Range   Glucose 89 70 - 99 mg/dL   BUN 17 6 - 24 mg/dL   Creatinine, Ser 7.84 (H) 0.57 - 1.00 mg/dL   eGFR 61 >69 GE/XBM/8.41   BUN/Creatinine Ratio 15 9 - 23   Sodium 141 134 - 144 mmol/L   Potassium 4.9 3.5 - 5.2 mmol/L   Chloride 108 (H) 96 - 106 mmol/L   CO2 18 (L) 20 - 29 mmol/L   Calcium 9.3 8.7 - 10.2 mg/dL   Total Protein 6.0 6.0 - 8.5 g/dL   Albumin 4.1 3.9 - 4.9 g/dL   Globulin, Total 1.9 1.5 - 4.5 g/dL   Bilirubin Total 0.3  0.0 - 1.2 mg/dL   Alkaline Phosphatase 48 44 - 121 IU/L   AST 10 0 - 40 IU/L   ALT 8 0 - 32 IU/L  CBC w/Diff   Collection Time: 04/20/23  9:35 AM  Result Value Ref Range   WBC 9.4 3.4 - 10.8 x10E3/uL   RBC 4.38 3.77 - 5.28 x10E6/uL   Hemoglobin 13.5 11.1 - 15.9 g/dL   Hematocrit 32.4 40.1 - 46.6 %   MCV 96 79 - 97 fL   MCH 30.8 26.6 - 33.0 pg   MCHC 32.0 31.5 - 35.7 g/dL   RDW 02.7 25.3 - 66.4 %   Platelets 312 150 - 450 x10E3/uL   Neutrophils 73 Not Estab. %   Lymphs 20 Not Estab. %   Monocytes 5 Not Estab. %   Eos 2 Not Estab. %   Basos 0 Not Estab. %   Neutrophils Absolute 6.8 1.4 - 7.0 x10E3/uL   Lymphocytes Absolute 1.9 0.7 - 3.1 x10E3/uL   Monocytes Absolute 0.4 0.1 - 0.9 x10E3/uL   EOS (ABSOLUTE) 0.2 0.0 - 0.4 x10E3/uL   Basophils Absolute 0.0 0.0 - 0.2 x10E3/uL   Immature Granulocytes 0 Not Estab. %   Immature Grans (Abs) 0.0 0.0 - 0.1 x10E3/uL  Lipid Profile   Collection Time: 04/20/23  9:35 AM  Result Value Ref Range   Cholesterol, Total 175 100 - 199 mg/dL   Triglycerides 403 0 - 149 mg/dL   HDL 41 >47 mg/dL   VLDL Cholesterol Cal 19 5 - 40 mg/dL   LDL Chol Calc (NIH) 425 (H) 0 - 99 mg/dL   Chol/HDL Ratio 4.3 0.0 - 4.4 ratio  TSH   Collection Time: 04/20/23  9:35 AM  Result Value Ref Range   TSH 1.550 0.450 - 4.500 uIU/mL  B12   Collection Time: 04/20/23  9:35 AM  Result Value Ref Range   Vitamin B-12 367 232 - 1,245 pg/mL      Assessment & Plan:   Problem List Items Addressed This Visit   None Visit Diagnoses       Acute cystitis with hematuria    -  Primary   Will treat with macrobid.  Complete course of antibiotics.  Increase water intake. Will send for culture and treat based on results.   Relevant Orders   Urine Culture     Dysuria  Relevant Orders   Urinalysis, Routine w reflex microscopic        Follow up plan: Return if symptoms worsen or fail to improve.

## 2023-07-24 NOTE — Telephone Encounter (Signed)
-----   Message from Larae Grooms sent at 07/24/2023  2:01 PM EST ----- Can we schedule her mammogram?

## 2023-07-28 LAB — URINE CULTURE

## 2023-08-07 ENCOUNTER — Telehealth: Payer: Self-pay

## 2023-08-07 MED ORDER — CIPROFLOXACIN HCL 500 MG PO TABS: 500.0000 mg | ORAL_TABLET | Freq: Two times a day (BID) | ORAL | 0 refills | Status: AC

## 2023-08-07 NOTE — Telephone Encounter (Signed)
Patient called in. Stated UTI has come back and patient would like to know if you could resend antibiotics or if you can send in a new one

## 2023-08-07 NOTE — Telephone Encounter (Signed)
Patient notified that medication has been sent in. 

## 2023-08-07 NOTE — Telephone Encounter (Signed)
Please let patient know that I sent her in Ciprofloxacin twice a day for 3 days.

## 2023-10-04 ENCOUNTER — Other Ambulatory Visit: Payer: Self-pay | Admitting: Physician Assistant

## 2023-10-05 NOTE — Telephone Encounter (Signed)
 Requested medication (s) are due for refill today:   Yes  Requested medication (s) are on the active medication list:   Yes  Future visit scheduled:   Yes 3/13   Last ordered: 04/20/2023 9 ml, 1 refill  Unable to refill because A1C due per protocol.   Last done in 2022   Requested Prescriptions  Pending Prescriptions Disp Refills   WEGOVY 2.4 MG/0.75ML SOAJ [Pharmacy Med Name: WEGOVY 2.4 MG/0.75 ML PEN] 9 mL 0    Sig: Inject 2.4 mg into the skin once a week.     Endocrinology:  Diabetes - GLP-1 Receptor Agonists - semaglutide Failed - 10/05/2023  1:31 PM      Failed - HBA1C in normal range and within 180 days    Hgb A1c MFr Bld  Date Value Ref Range Status  11/04/2020 5.2 4.8 - 5.6 % Final    Comment:             Prediabetes: 5.7 - 6.4          Diabetes: >6.4          Glycemic control for adults with diabetes: <7.0          Failed - Cr in normal range and within 360 days    Creatinine, Ser  Date Value Ref Range Status  04/20/2023 1.12 (H) 0.57 - 1.00 mg/dL Final         Passed - Valid encounter within last 6 months    Recent Outpatient Visits           2 months ago Acute cystitis with hematuria   [facility] Larae Grooms, NP   5 months ago Gastroesophageal reflux disease, unspecified whether esophagitis present   [place] [facility] Mecum, Oswaldo Conroy, PA-C   11 months ago Annual physical exam   [place] [facility] Larae Grooms, NP   1 year ago Moderate episode of recurrent major depressive disorder [facility])   [place] [facility] Larae Grooms, NP   1 year ago Chronic migraine without aura without status migrainosus, not intractable   [place] [facility] Mecum, Oswaldo Conroy, PA-C       Future Appointments             In 2 weeks Larae Grooms, NP [facility]

## 2023-10-10 ENCOUNTER — Telehealth: Payer: Self-pay

## 2023-10-10 ENCOUNTER — Encounter: Payer: Self-pay | Admitting: Nurse Practitioner

## 2023-10-10 NOTE — Telephone Encounter (Signed)
 PA for [facility] initiated and submitted via Cover My Meds. Key: [id number]

## 2023-10-19 ENCOUNTER — Ambulatory Visit: Payer: BC Managed Care – PPO | Admitting: Nurse Practitioner

## 2023-10-19 ENCOUNTER — Encounter: Payer: Self-pay | Admitting: Nurse Practitioner

## 2023-10-19 VITALS — BP 92/64 | HR 81 | Ht 62.0 in | Wt 165.8 lb

## 2023-10-19 DIAGNOSIS — Z6836 Body mass index (BMI) 36.0-36.9, adult: Secondary | ICD-10-CM | POA: Diagnosis not present

## 2023-10-19 DIAGNOSIS — Z0000 Encounter for general adult medical examination without abnormal findings: Secondary | ICD-10-CM | POA: Diagnosis not present

## 2023-10-19 DIAGNOSIS — F411 Generalized anxiety disorder: Secondary | ICD-10-CM

## 2023-10-19 DIAGNOSIS — E66812 Obesity, class 2: Secondary | ICD-10-CM | POA: Diagnosis not present

## 2023-10-19 DIAGNOSIS — F331 Major depressive disorder, recurrent, moderate: Secondary | ICD-10-CM

## 2023-10-19 DIAGNOSIS — E559 Vitamin D deficiency, unspecified: Secondary | ICD-10-CM

## 2023-10-19 DIAGNOSIS — E6609 Other obesity due to excess calories: Secondary | ICD-10-CM

## 2023-10-19 DIAGNOSIS — G43709 Chronic migraine without aura, not intractable, without status migrainosus: Secondary | ICD-10-CM

## 2023-10-19 DIAGNOSIS — Z1231 Encounter for screening mammogram for malignant neoplasm of breast: Secondary | ICD-10-CM

## 2023-10-19 DIAGNOSIS — R7989 Other specified abnormal findings of blood chemistry: Secondary | ICD-10-CM

## 2023-10-19 LAB — MICROSCOPIC EXAMINATION

## 2023-10-19 LAB — URINALYSIS, ROUTINE W REFLEX MICROSCOPIC
Bilirubin, UA: NEGATIVE
Glucose, UA: NEGATIVE
Ketones, UA: NEGATIVE
Nitrite, UA: NEGATIVE
Protein,UA: NEGATIVE
Specific Gravity, UA: 1.02 (ref 1.005–1.030)
Urobilinogen, Ur: 1 mg/dL (ref 0.2–1.0)
pH, UA: 6 (ref 5.0–7.5)

## 2023-10-19 MED ORDER — BACLOFEN 10 MG PO TABS
10.0000 mg | ORAL_TABLET | Freq: Two times a day (BID) | ORAL | 1 refills | Status: DC | PRN
Start: 2023-10-19 — End: 2024-04-24

## 2023-10-19 MED ORDER — PANTOPRAZOLE SODIUM 40 MG PO TBEC: 40.0000 mg | DELAYED_RELEASE_TABLET | Freq: Every day | ORAL | 1 refills | Status: DC

## 2023-10-19 MED ORDER — AIMOVIG 140 MG/ML ~~LOC~~ SOAJ: 140.0000 mg | SUBCUTANEOUS | 4 refills | Status: DC

## 2023-10-19 MED ORDER — DULOXETINE HCL 60 MG PO CPEP: 60.0000 mg | ORAL_CAPSULE | Freq: Every day | ORAL | 1 refills | Status: DC

## 2023-10-19 MED ORDER — PROMETHAZINE HCL 25 MG PO TABS: 25.0000 mg | ORAL_TABLET | Freq: Three times a day (TID) | ORAL | 2 refills | Status: DC | PRN

## 2023-10-19 MED ORDER — TOPIRAMATE 200 MG PO TABS: 200.0000 mg | ORAL_TABLET | Freq: Every day | ORAL | 1 refills | Status: DC

## 2023-10-19 MED ORDER — RIZATRIPTAN BENZOATE 10 MG PO TBDP: 10.0000 mg | ORAL_TABLET | ORAL | 0 refills | Status: DC | PRN

## 2023-10-19 MED ORDER — ARIPIPRAZOLE 2 MG PO TABS: 2.0000 mg | ORAL_TABLET | Freq: Every day | ORAL | 1 refills | Status: DC

## 2023-10-19 NOTE — Progress Notes (Unsigned)
 BP 92/64 (BP Location: Right Arm, Patient Position: Sitting, Cuff Size: Large)   Pulse 81   Ht 5\' 2"  (1.575 m)   Wt 165 lb 12.8 oz (75.2 kg)   SpO2 99%   BMI 30.33 kg/m    Subjective:    Patient ID: Mia Thompson, female    DOB: 12-22-1975, 48 y.o.   MRN: [id number]  HPI: Mia Thompson is a 48 y.o. female presenting on 10/19/2023 for comprehensive medical examination. Current medical complaints include: Weight loss  She currently lives with: Menopausal Symptoms: no  WEIGHT LOSS Patient states she is doing well with the [facility].  She has lost over 55 lbs since starting the medication.  She does get nausea sometimes and had heart burn with it also but she is managing it well.  She does use Phenergan and Pantoprazole as needed for symptoms.  Patient would like to get down to 130lbs.  MIGRAINES Patient states her migraines are not as frequent but the intensity is increasing.  When she gets one she is having a lot of vomiting.  She would like the Rizatriptan in sublingual and increase the Aimovig to see if that helps with the intensity.   DEPRESSION/ANXIETY Patient feels like her depression and anxiety are also well controlled.  Denies concerns at visit today. Feels like the abilify and duloxetine are working well for her.      Depression Screen done today and results listed below:     10/19/2023    4:07 PM 07/24/2023    1:38 PM 04/20/2023    8:57 AM 10/18/2022    9:40 AM 04/19/2022    3:01 PM  Depression screen PHQ 2/9  Decreased Interest 0 0 0 0 0  Down, Depressed, Hopeless 0 0 0 0 0  PHQ - 2 Score 0 0 0 0 0  Altered sleeping 2 1 1 1 2   Tired, decreased energy 1 1 1 1 3   Change in appetite 1 0 0 0 3  Feeling bad or failure about yourself  0 0 0 0 0  Trouble concentrating 0 0 1 1 0  Moving slowly or fidgety/restless 0 0 0 0 0  Suicidal thoughts 0 0 0 0 0  PHQ-9 Score 4 2 3 3 8   Difficult doing work/chores   Not difficult at all Not difficult at all Somewhat difficult     The patient does not have a history of falls. I did complete a risk assessment for falls. A plan of care for falls was documented.   Past Medical History:  Past Medical History:  Diagnosis Date   Anxiety    Depression    GERD (gastroesophageal reflux disease)    Kidney stone    Migraines    Migraines    sees Neuro   Prolapse of female bladder, acquired     Surgical History:  Past Surgical History:  Procedure Laterality Date   IUD REMOVAL N/A 05/08/2017   Procedure: INTRAUTERINE DEVICE (IUD) REMOVAL;  Surgeon: Hildred Laser, MD;  Location: [facility] OR[facility];  Service: Gynecology;  Laterality: N/A;   LAPAROSCOPIC LYSIS OF ADHESIONS  05/08/2017   Procedure: LAPAROSCOPIC LYSIS OF ADHESIONS;  Surgeon: Hildred Laser, MD;  Location: [facility];  Service: Gynecology;;   LAPAROSCOPY N/A 05/08/2017   Procedure: LAPAROSCOPY OPERATIVE;  Surgeon: Hildred Laser, MD;  Location: [facility];  Service: Gynecology;  Laterality: N/A;   THUMB SURGERY Left AGE 70   WISDOM TOOTH EXTRACTION      Medications:  Current Outpatient Medications on  File Prior to Visit  Medication Sig   cetirizine (ZYRTEC) 10 MG tablet Take 10 mg by mouth at bedtime.   Lactobacillus Probiotic TABS Take 5 Billion Cells by mouth daily.   Levonorgestrel-Ethinyl Estradiol (CAMRESE) 0.15-0.03 &0.01 MG tablet Take 1 tablet by mouth at bedtime.   Multiple Vitamin (MULTI-VITAMINS) TABS Take 1 tablet by mouth daily.    naproxen (NAPROSYN) 500 MG tablet Take 1 tablet (500 mg total) by mouth 2 (two) times daily as needed for headache.   Vitamin D, Ergocalciferol, 50000 units CAPS Take 1 capsule (50,000 Units total) by mouth every 7 (seven) days.   WEGOVY 2.4 MG/0.75ML SOAJ Inject 2.4 mg into the skin once a week.   No current facility-administered medications on file prior to visit.    Allergies:  Allergies  Allergen Reactions   Vantin [Cefpodoxime] Rash    Shortness of breath    Social History:  Social History   Socioeconomic  History   Marital status: Married    Spouse name: Not on file   Number of children: Not on file   Years of education: Not on file   Highest education level: Not on file  Occupational History   Occupation: Pharmacy Tech  Tobacco Use   Smoking status: Former   Smokeless tobacco: Never  Vaping Use   Vaping status: Never Used  Substance and Sexual Activity   Alcohol use: Not Currently    Comment: occassional   Drug use: No   Sexual activity: Yes    Birth control/protection: Pill  Other Topics Concern   Not on file  Social History Narrative   Not on file   Social Drivers of Health   Financial Resource Strain: Not on file  Food Insecurity: Not on file  Transportation Needs: Not on file  Physical Activity: Not on file  Stress: Not on file  Social Connections: Not on file  Intimate Partner Violence: Not on file   Social History   Tobacco Use  Smoking Status Former  Smokeless Tobacco Never   Social History   Substance and Sexual Activity  Alcohol Use Not Currently   Comment: occassional    Family History:  Family History  Adopted: Yes  Problem Relation Age of Onset   Breast cancer Neg Hx     Past medical history, surgical history, medications, allergies, family history and social history reviewed with patient today and changes made to appropriate areas of the chart.   Review of Systems  Eyes:  Negative for blurred vision and double vision.  Respiratory:  Negative for shortness of breath.   Cardiovascular:  Negative for chest pain, palpitations and leg swelling.  Neurological:  Negative for dizziness and headaches.  Psychiatric/Behavioral:  Positive for depression. Negative for suicidal ideas. The patient is nervous/anxious.    All other ROS negative except what is listed above and in the HPI.      Objective:    BP 92/64 (BP Location: Right Arm, Patient Position: Sitting, Cuff Size: Large)   Pulse 81   Ht 5\' 2"  (1.575 m)   Wt 165 lb 12.8 oz (75.2 kg)    SpO2 99%   BMI 30.33 kg/m   Wt Readings from Last 3 Encounters:  10/19/23 165 lb 12.8 oz (75.2 kg)  07/24/23 164 lb 6.4 oz (74.6 kg)  04/20/23 169 lb (76.7 kg)    Physical Exam Vitals and nursing note reviewed.  Constitutional:      General: She is awake. She is not in acute distress.  Appearance: Normal appearance. She is well-developed. She is obese. She is not ill-appearing.  HENT:     Head: Normocephalic and atraumatic.     Right Ear: Hearing, tympanic membrane, ear canal and external ear normal. No drainage.     Left Ear: Hearing, tympanic membrane, ear canal and external ear normal. No drainage.     Nose: Nose normal.     Right Sinus: No maxillary sinus tenderness or frontal sinus tenderness.     Left Sinus: No maxillary sinus tenderness or frontal sinus tenderness.     Mouth/Throat:     Mouth: Mucous membranes are moist.     Pharynx: Oropharynx is clear. Uvula midline. No pharyngeal swelling, oropharyngeal exudate or posterior oropharyngeal erythema.  Eyes:     General: Lids are normal.        Right eye: No discharge.        Left eye: No discharge.     Extraocular Movements: Extraocular movements intact.     Conjunctiva/sclera: Conjunctivae normal.     Pupils: Pupils are equal, round, and reactive to light.     Visual Fields: Right eye visual fields normal and left eye visual fields normal.  Neck:     Thyroid: No thyromegaly.     Vascular: No carotid bruit.     Trachea: Trachea normal.  Cardiovascular:     Rate and Rhythm: Normal rate and regular rhythm.     Heart sounds: Normal heart sounds. No murmur heard.    No gallop.  Pulmonary:     Effort: Pulmonary effort is normal. No accessory muscle usage or respiratory distress.     Breath sounds: Normal breath sounds.  Chest:  Breasts:    Right: Normal.     Left: Normal.  Abdominal:     General: Bowel sounds are normal.     Palpations: Abdomen is soft. There is no hepatomegaly or splenomegaly.     Tenderness:  There is no abdominal tenderness.  Musculoskeletal:        General: Normal range of motion.     Cervical back: Normal range of motion and neck supple.     Right lower leg: No edema.     Left lower leg: No edema.  Lymphadenopathy:     Head:     Right side of head: No submental, submandibular, tonsillar, preauricular or posterior auricular adenopathy.     Left side of head: No submental, submandibular, tonsillar, preauricular or posterior auricular adenopathy.     Cervical: No cervical adenopathy.     Upper Body:     Right upper body: No supraclavicular, axillary or pectoral adenopathy.     Left upper body: No supraclavicular, axillary or pectoral adenopathy.  Skin:    General: Skin is warm and dry.     Capillary Refill: Capillary refill takes less than 2 seconds.     Findings: No rash.  Neurological:     Mental Status: She is alert and oriented to person, place, and time.     Gait: Gait is intact.     Deep Tendon Reflexes: Reflexes are normal and symmetric.     Reflex Scores:      Brachioradialis reflexes are 2+ on the right side and 2+ on the left side.      Patellar reflexes are 2+ on the right side and 2+ on the left side. Psychiatric:        Attention and Perception: Attention normal.        Mood and Affect: Mood normal.  Speech: Speech normal.        Behavior: Behavior normal. Behavior is cooperative.        Thought Content: Thought content normal.        Judgment: Judgment normal.     Results for orders placed or performed in visit on 10/19/23  Microscopic Examination   Collection Time: 10/19/23  4:13 PM   Urine  Result Value Ref Range   WBC, UA 6-10 (A) 0 - 5 /hpf   RBC, Urine 3-10 (A) 0 - 2 /hpf   Epithelial Cells (non renal) 0-10 0 - 10 /hpf   Bacteria, UA Moderate (A) None seen/Few  Urinalysis, Routine w reflex microscopic   Collection Time: 10/19/23  4:13 PM  Result Value Ref Range   Specific Gravity, UA 1.020 1.005 - 1.030   pH, UA 6.0 5.0 - 7.5    Color, UA Yellow Yellow   Appearance Ur Slightly cloudy Clear   Leukocytes,UA 1+ (A) Negative   Protein,UA Negative Negative/Trace   Glucose, UA Negative Negative   Ketones, UA Negative Negative   RBC, UA Trace (A) Negative   Bilirubin, UA Negative Negative   Urobilinogen, Ur 1.0 0.2 - 1.0 mg/dL   Nitrite, UA Negative Negative   Microscopic Examination See below:   CBC with Differential/Platelet   Collection Time: 10/19/23  4:14 PM  Result Value Ref Range   WBC 7.6 3.4 - 10.8 x10E3/uL   RBC 4.27 3.77 - 5.28 x10E6/uL   Hemoglobin 13.2 11.1 - 15.9 g/dL   Hematocrit 09.8 11.9 - 46.6 %   MCV 95 79 - 97 fL   MCH 30.9 26.6 - 33.0 pg   MCHC 32.6 31.5 - 35.7 g/dL   RDW 14.7 82.9 - 56.2 %   Platelets 287 150 - 450 x10E3/uL   Neutrophils 58 Not Estab. %   Lymphs 33 Not Estab. %   Monocytes 5 Not Estab. %   Eos 3 Not Estab. %   Basos 1 Not Estab. %   Neutrophils Absolute 4.4 1.4 - 7.0 x10E3/uL   Lymphocytes Absolute 2.5 0.7 - 3.1 x10E3/uL   Monocytes Absolute 0.4 0.1 - 0.9 x10E3/uL   EOS (ABSOLUTE) 0.3 0.0 - 0.4 x10E3/uL   Basophils Absolute 0.0 0.0 - 0.2 x10E3/uL   Immature Granulocytes 0 Not Estab. %   Immature Grans (Abs) 0.0 0.0 - 0.1 x10E3/uL  Comprehensive metabolic panel   Collection Time: 10/19/23  4:14 PM  Result Value Ref Range   Glucose 77 70 - 99 mg/dL   BUN 16 6 - 24 mg/dL   Creatinine, Ser 1.30 (H) 0.57 - 1.00 mg/dL   eGFR 63 >86 VH/QIO/9.62   BUN/Creatinine Ratio 15 9 - 23   Sodium 138 134 - 144 mmol/L   Potassium 4.1 3.5 - 5.2 mmol/L   Chloride 105 96 - 106 mmol/L   CO2 19 (L) 20 - 29 mmol/L   Calcium 9.1 8.7 - 10.2 mg/dL   Total Protein 6.1 6.0 - 8.5 g/dL   Albumin 4.3 3.9 - 4.9 g/dL   Globulin, Total 1.8 1.5 - 4.5 g/dL   Bilirubin Total <9.5 0.0 - 1.2 mg/dL   Alkaline Phosphatase 49 44 - 121 IU/L   AST 13 0 - 40 IU/L   ALT 9 0 - 32 IU/L  Lipid panel   Collection Time: 10/19/23  4:14 PM  Result Value Ref Range   Cholesterol, Total 149 100 - 199 mg/dL    Triglycerides 84 0 - 149 mg/dL   HDL  47 >39 mg/dL   VLDL Cholesterol Cal 16 5 - 40 mg/dL   LDL Chol Calc (NIH) 86 0 - 99 mg/dL   Chol/HDL Ratio 3.2 0.0 - 4.4 ratio  TSH   Collection Time: 10/19/23  4:14 PM  Result Value Ref Range   TSH 1.890 0.450 - 4.500 uIU/mL  Vitamin D (25 hydroxy)   Collection Time: 10/19/23  4:14 PM  Result Value Ref Range   Vit D, 25-Hydroxy 52.3 30.0 - 100.0 ng/mL      Assessment & Plan:   Problem List Items Addressed This Visit       Cardiovascular and Mediastinum   Migraine   Chronic.  Intensity of migraines has increased.  Will increase dose of Aimovig to see if symptoms improve.  Patient prefers ODT version of Maxalt.  Sent in for patient.  Follow up in 3 months.  Call sooner if concerns arise.       Relevant Medications   Erenumab-aooe (AIMOVIG) 140 MG/ML SOAJ   rizatriptan (MAXALT-MLT) 10 MG disintegrating tablet   baclofen (LIORESAL) 10 MG tablet   DULoxetine (CYMBALTA) 60 MG capsule   topiramate (TOPAMAX) 200 MG tablet     Other   Anxiety, generalized   Chronic.  Controlled.  Continue with current medication regimen of Abilify and Duloxetine daily.  Refills sent today.  Labs ordered today.  Return to clinic in 6 months for reevaluation.  Call sooner if concerns arise.       Relevant Medications   DULoxetine (CYMBALTA) 60 MG capsule   Depression   Chronic.  Controlled.  Continue with current medication regimen of Abilify and Duloxetine daily.  Refills sent today.  Labs ordered today.  Return to clinic in 6 months for reevaluation.  Call sooner if concerns arise.       Relevant Medications   DULoxetine (CYMBALTA) 60 MG capsule   Class 2 obesity due to excess calories without serious comorbidity with body mass index (BMI) of 36.0 to 36.9 in adult   Chronic.  Doing well with weight loss on Wegovy.  Patient has lost about 50lbs with medication.  She has about 30lbs to go to get to her goal.  Refilled [facility] 2.4mg  weekly.  Follow up in 3  months.  Call sooner if concerns arise.       Relevant Orders   Lipid panel (Completed)   Vitamin D deficiency   Labs ordered at visit today.  Will make recommendations based on lab results.        Relevant Orders   Vitamin D (25 hydroxy) (Completed)   Other Visit Diagnoses       Annual physical exam    -  Primary   Health maintenance reviewed during visit. Labs ordered.  Mammogram ordered.  PAP up to date. Cologuard up to date.   Relevant Orders   CBC with Differential/Platelet (Completed)   Comprehensive metabolic panel (Completed)   Lipid panel (Completed)   TSH (Completed)   Urinalysis, Routine w reflex microscopic (Completed)   Vitamin D (25 hydroxy) (Completed)     Encounter for screening mammogram for malignant neoplasm of breast       Relevant Orders   MM 3D SCREENING MAMMOGRAM BILATERAL BREAST        Follow up plan: Return in about 3 months (around 01/19/2024) for Migraines.   LABORATORY TESTING:  - Pap smear: up to date  IMMUNIZATIONS:   - Tdap: Tetanus vaccination status reviewed: last tetanus booster within 10 years. - Influenza: Given  elsewhere - Pneumovax: NA - Prevnar: NA - COVID: Given elsewhere - HPV: NA - Shingrix vaccine: NA  SCREENING: -Mammogram: Up to date - Colonoscopy: Ordered today - Bone Density: NA -Hearing Test: NA -Spirometry: NA  PATIENT COUNSELING:   Advised to take 1 mg of folate supplement per day if capable of pregnancy.   Sexuality: Discussed sexually transmitted diseases, partner selection, use of condoms, avoidance of unintended pregnancy  and contraceptive alternatives.   Advised to avoid cigarette smoking.  I discussed with the patient that most people either abstain from alcohol or drink within safe limits (<=14/week and <=4 drinks/occasion for males, <=7/weeks and <= 3 drinks/occasion for females) and that the risk for alcohol disorders and other health effects rises proportionally with the number of drinks per week  and how often a drinker exceeds daily limits.  Discussed cessation/primary prevention of drug use and availability of treatment for abuse.   Diet: Encouraged to adjust caloric intake to maintain  or achieve ideal body weight, to reduce intake of dietary saturated fat and total fat, to limit sodium intake by avoiding high sodium foods and not adding table salt, and to maintain adequate dietary potassium and calcium preferably from fresh fruits, vegetables, and low-fat dairy products.    stressed the importance of regular exercise  Injury prevention: Discussed safety belts, safety helmets, smoke detector, smoking near bedding or upholstery.   Dental health: Discussed importance of regular tooth brushing, flossing, and dental visits.    NEXT PREVENTATIVE PHYSICAL DUE IN 1 YEAR. Return in about 3 months (around 01/19/2024) for Migraines.

## 2023-10-19 NOTE — Patient Instructions (Signed)
 Please call to schedule your mammogram and/or bone density: [facility]  Address: [address], [place], [place] [address] Phone: [phone]  [facility] [address] [place],  [place]  [address] Phone: [phone]

## 2023-10-20 ENCOUNTER — Encounter: Payer: Self-pay | Admitting: Nurse Practitioner

## 2023-10-20 LAB — COMPREHENSIVE METABOLIC PANEL
ALT: 9 IU/L (ref 0–32)
AST: 13 IU/L (ref 0–40)
Albumin: 4.3 g/dL (ref 3.9–4.9)
Alkaline Phosphatase: 49 IU/L (ref 44–121)
BUN/Creatinine Ratio: 15 (ref 9–23)
BUN: 16 mg/dL (ref 6–24)
Bilirubin Total: <0.2 mg/dL (ref 0.0–1.2)
CO2: 19 mmol/L — ABNORMAL LOW (ref 20–29)
Calcium: 9.1 mg/dL (ref 8.7–10.2)
Chloride: 105 mmol/L (ref 96–106)
Creatinine, Ser: 1.1 mg/dL — ABNORMAL HIGH (ref 0.57–1.00)
Globulin, Total: 1.8 g/dL (ref 1.5–4.5)
Glucose: 77 mg/dL (ref 70–99)
Potassium: 4.1 mmol/L (ref 3.5–5.2)
Sodium: 138 mmol/L (ref 134–144)
Total Protein: 6.1 g/dL (ref 6.0–8.5)
eGFR: 63 mL/min/{1.73_m2} (ref >59)

## 2023-10-20 LAB — LIPID PANEL
Chol/HDL Ratio: 3.2 ratio (ref 0.0–4.4)
Cholesterol, Total: 149 mg/dL (ref 100–199)
HDL: 47 mg/dL (ref >39)
LDL Chol Calc (NIH): 86 mg/dL (ref 0–99)
Triglycerides: 84 mg/dL (ref 0–149)
VLDL Cholesterol Cal: 16 mg/dL (ref 5–40)

## 2023-10-20 LAB — CBC WITH DIFFERENTIAL/PLATELET
Basophils Absolute: 0 10*3/uL (ref 0.0–0.2)
Basos: 1 %
EOS (ABSOLUTE): 0.3 10*3/uL (ref 0.0–0.4)
Eos: 3 %
Hematocrit: 40.5 % (ref 34.0–46.6)
Hemoglobin: 13.2 g/dL (ref 11.1–15.9)
Immature Grans (Abs): 0 10*3/uL (ref 0.0–0.1)
Immature Granulocytes: 0 %
Lymphocytes Absolute: 2.5 10*3/uL (ref 0.7–3.1)
Lymphs: 33 %
MCH: 30.9 pg (ref 26.6–33.0)
MCHC: 32.6 g/dL (ref 31.5–35.7)
MCV: 95 fL (ref 79–97)
Monocytes Absolute: 0.4 10*3/uL (ref 0.1–0.9)
Monocytes: 5 %
Neutrophils Absolute: 4.4 10*3/uL (ref 1.4–7.0)
Neutrophils: 58 %
Platelets: 287 10*3/uL (ref 150–450)
RBC: 4.27 x10E6/uL (ref 3.77–5.28)
RDW: 12 % (ref 11.7–15.4)
WBC: 7.6 10*3/uL (ref 3.4–10.8)

## 2023-10-20 LAB — TSH: TSH: 1.89 u[IU]/mL (ref 0.450–4.500)

## 2023-10-20 LAB — VITAMIN D 25 HYDROXY (VIT D DEFICIENCY, FRACTURES): Vit D, 25-Hydroxy: 52.3 ng/mL (ref 30.0–100.0)

## 2023-10-20 NOTE — Assessment & Plan Note (Signed)
 Labs ordered at visit today.  Will make recommendations based on lab results.

## 2023-10-20 NOTE — Assessment & Plan Note (Signed)
 Chronic.  Intensity of migraines has increased.  Will increase dose of Aimovig to see if symptoms improve.  Patient prefers ODT version of Maxalt.  Sent in for patient.  Follow up in 3 months.  Call sooner if concerns arise.

## 2023-10-20 NOTE — Addendum Note (Signed)
 Addended by: Larae Grooms on: 10/20/2023 09:26 AM   Modules accepted: Orders

## 2023-10-20 NOTE — Assessment & Plan Note (Signed)
 Chronic.  Controlled.  Continue with current medication regimen of Abilify and Duloxetine daily.  Refills sent today.  Labs ordered today.  Return to clinic in 6 months for reevaluation.  Call sooner if concerns arise.

## 2023-10-20 NOTE — Assessment & Plan Note (Signed)
 Chronic.  Doing well with weight loss on Wegovy.  Patient has lost about 50lbs with medication.  She has about 30lbs to go to get to her goal.  Refilled Wegovy 2.4mg  weekly.  Follow up in 3 months.  Call sooner if concerns arise.

## 2023-11-14 ENCOUNTER — Ambulatory Visit
Admission: RE | Admit: 2023-11-14 | Discharge: 2023-11-14 | Disposition: A | Discharge status: Home / Self Care | Source: Ambulatory Visit | Attending: Nurse Practitioner | Admitting: Nurse Practitioner

## 2023-11-14 DIAGNOSIS — Z1231 Encounter for screening mammogram for malignant neoplasm of breast: Secondary | ICD-10-CM | POA: Insufficient documentation

## 2023-12-21 ENCOUNTER — Telehealth: Payer: Self-pay

## 2023-12-21 NOTE — Telephone Encounter (Signed)
 PA for Aimovig  initiated and submitted via Cover My Meds. Key: [id number]

## 2023-12-21 NOTE — Telephone Encounter (Signed)
PA approved. Mychart message sent to patient.

## 2024-01-05 ENCOUNTER — Other Ambulatory Visit: Payer: Self-pay

## 2024-01-05 MED ORDER — WEGOVY 2.4 MG/0.75ML ~~LOC~~ SOAJ: 2.4000 mg | SUBCUTANEOUS | 0 refills | Status: DC

## 2024-01-09 ENCOUNTER — Other Ambulatory Visit: Payer: Self-pay | Admitting: Nurse Practitioner

## 2024-01-10 NOTE — Telephone Encounter (Signed)
 Requested Prescriptions  Pending Prescriptions Disp Refills   rizatriptan  (MAXALT -MLT) 10 MG disintegrating tablet [Pharmacy Med Name: RIZATRIPTAN  10 MG ODT] 10 tablet 0    Sig: Take 1 tablet (10 mg total) by mouth as needed for migraine. May repeat in 2 hours if needed     Neurology:  Migraine Therapy - Triptan Passed - 01/10/2024  1:46 PM      Passed - Last BP in normal range    BP Readings from Last 1 Encounters:  10/19/23 92/64         Passed - Valid encounter within last 12 months    Recent Outpatient Visits           2 months ago Annual physical exam   [facility] Aileen Alexanders, NP

## 2024-01-22 ENCOUNTER — Ambulatory Visit: Admitting: Nurse Practitioner

## 2024-01-22 NOTE — Progress Notes (Deleted)
 There were no vitals taken for this visit.   Subjective:    Patient ID: Mia Thompson, female    DOB: September 17, 1975, 48 y.o.   MRN: [id number]  HPI: Mia Thompson is a 48 y.o. female  No chief complaint on file.  MIGRAINES Patient states her migraines are not as frequent but the intensity is increasing.  When she gets one she is having a lot of vomiting.  She would like the Rizatriptan  in sublingual and increase the Aimovig  to see if that helps with the intensity.   Relevant past medical, surgical, family and social history reviewed and updated as indicated. Interim medical history since our last visit reviewed. Allergies and medications reviewed and updated.  Review of Systems  Per HPI unless specifically indicated above     Objective:    There were no vitals taken for this visit.  Wt Readings from Last 3 Encounters:  10/19/23 165 lb 12.8 oz (75.2 kg)  07/24/23 164 lb 6.4 oz (74.6 kg)  04/20/23 169 lb (76.7 kg)    Physical Exam  Results for orders placed or performed in visit on 10/19/23  Microscopic Examination   Collection Time: 10/19/23  4:13 PM   Urine  Result Value Ref Range   WBC, UA 6-10 (A) 0 - 5 /hpf   RBC, Urine 3-10 (A) 0 - 2 /hpf   Epithelial Cells (non renal) 0-10 0 - 10 /hpf   Bacteria, UA Moderate (A) None seen/Few  Urinalysis, Routine w reflex microscopic   Collection Time: 10/19/23  4:13 PM  Result Value Ref Range   Specific Gravity, UA 1.020 1.005 - 1.030   pH, UA 6.0 5.0 - 7.5   Color, UA Yellow Yellow   Appearance Ur Slightly cloudy Clear   Leukocytes,UA 1+ (A) Negative   Protein,UA Negative Negative/Trace   Glucose, UA Negative Negative   Ketones, UA Negative Negative   RBC, UA Trace (A) Negative   Bilirubin, UA Negative Negative   Urobilinogen, Ur 1.0 0.2 - 1.0 mg/dL   Nitrite, UA Negative Negative   Microscopic Examination See below:   CBC with Differential/Platelet   Collection Time: 10/19/23  4:14 PM  Result Value Ref Range   WBC  7.6 3.4 - 10.8 x10E3/uL   RBC 4.27 3.77 - 5.28 x10E6/uL   Hemoglobin 13.2 11.1 - 15.9 g/dL   Hematocrit 40.9 81.1 - 46.6 %   MCV 95 79 - 97 fL   MCH 30.9 26.6 - 33.0 pg   MCHC 32.6 31.5 - 35.7 g/dL   RDW 91.4 78.2 - 95.6 %   Platelets 287 150 - 450 x10E3/uL   Neutrophils 58 Not Estab. %   Lymphs 33 Not Estab. %   Monocytes 5 Not Estab. %   Eos 3 Not Estab. %   Basos 1 Not Estab. %   Neutrophils Absolute 4.4 1.4 - 7.0 x10E3/uL   Lymphocytes Absolute 2.5 0.7 - 3.1 x10E3/uL   Monocytes Absolute 0.4 0.1 - 0.9 x10E3/uL   EOS (ABSOLUTE) 0.3 0.0 - 0.4 x10E3/uL   Basophils Absolute 0.0 0.0 - 0.2 x10E3/uL   Immature Granulocytes 0 Not Estab. %   Immature Grans (Abs) 0.0 0.0 - 0.1 x10E3/uL  Comprehensive metabolic panel   Collection Time: 10/19/23  4:14 PM  Result Value Ref Range   Glucose 77 70 - 99 mg/dL   BUN 16 6 - 24 mg/dL   Creatinine, Ser 2.13 (H) 0.57 - 1.00 mg/dL   eGFR 63 >08 MV/HQI/6.96   BUN/Creatinine Ratio  15 9 - 23   Sodium 138 134 - 144 mmol/L   Potassium 4.1 3.5 - 5.2 mmol/L   Chloride 105 96 - 106 mmol/L   CO2 19 (L) 20 - 29 mmol/L   Calcium 9.1 8.7 - 10.2 mg/dL   Total Protein 6.1 6.0 - 8.5 g/dL   Albumin 4.3 3.9 - 4.9 g/dL   Globulin, Total 1.8 1.5 - 4.5 g/dL   Bilirubin Total <1.6 0.0 - 1.2 mg/dL   Alkaline Phosphatase 49 44 - 121 IU/L   AST 13 0 - 40 IU/L   ALT 9 0 - 32 IU/L  Lipid panel   Collection Time: 10/19/23  4:14 PM  Result Value Ref Range   Cholesterol, Total 149 100 - 199 mg/dL   Triglycerides 84 0 - 149 mg/dL   HDL 47 >10 mg/dL   VLDL Cholesterol Cal 16 5 - 40 mg/dL   LDL Chol Calc (NIH) 86 0 - 99 mg/dL   Chol/HDL Ratio 3.2 0.0 - 4.4 ratio  TSH   Collection Time: 10/19/23  4:14 PM  Result Value Ref Range   TSH 1.890 0.450 - 4.500 uIU/mL  Vitamin D  (25 hydroxy)   Collection Time: 10/19/23  4:14 PM  Result Value Ref Range   Vit D, 25-Hydroxy 52.3 30.0 - 100.0 ng/mL      Assessment & Plan:   Problem List Items Addressed This Visit    None    Follow up plan: No follow-ups on file.

## 2024-01-23 ENCOUNTER — Encounter: Payer: Self-pay | Admitting: Nurse Practitioner

## 2024-01-23 ENCOUNTER — Ambulatory Visit: Admitting: Nurse Practitioner

## 2024-01-23 VITALS — BP 98/63 | HR 83 | Temp 98.7°F | Resp 15 | Ht 62.01 in | Wt 161.0 lb

## 2024-01-23 DIAGNOSIS — E6609 Other obesity due to excess calories: Secondary | ICD-10-CM

## 2024-01-23 DIAGNOSIS — G43709 Chronic migraine without aura, not intractable, without status migrainosus: Secondary | ICD-10-CM

## 2024-01-23 DIAGNOSIS — E66812 Obesity, class 2: Secondary | ICD-10-CM | POA: Diagnosis not present

## 2024-01-23 DIAGNOSIS — Z6836 Body mass index (BMI) 36.0-36.9, adult: Secondary | ICD-10-CM

## 2024-01-23 MED ORDER — ZEPBOUND 10 MG/0.5ML ~~LOC~~ SOAJ: 10.0000 mg | SUBCUTANEOUS | 0 refills | Status: DC

## 2024-01-23 MED ORDER — RIZATRIPTAN BENZOATE 10 MG PO TBDP: 10.0000 mg | ORAL_TABLET | ORAL | 0 refills | Status: DC | PRN

## 2024-01-23 NOTE — Assessment & Plan Note (Signed)
Chronic.  Controlled.  Continue with current medication regimen.  Return to clinic in 6 months for reevaluation.  Call sooner if concerns arise.  ? ?

## 2024-01-23 NOTE — Assessment & Plan Note (Signed)
 Chronic.  Much improved since starting Wegovy .  Weight loss goal is 135lbs.  Will change from Wegovy  to Zepbound 10mg  weekly to help aid in weight loss.  Follow up in 3 months.  Call sooner if concerns arise.

## 2024-01-23 NOTE — Progress Notes (Signed)
 BP 98/63 (BP Location: Right Arm, Patient Position: Sitting, Cuff Size: Normal)   Pulse 83   Temp 98.7 F (37.1 C) (Oral)   Resp 15   Ht 5' 2.01 (1.575 m)   Wt 161 lb (73 kg)   LMP  (LMP Unknown)   SpO2 99%   BMI 29.44 kg/m    Subjective:    Patient ID: Mia Thompson, female    DOB: September 02, 1975, 48 y.o.   MRN: [id number]  HPI: Mia Thompson is a 48 y.o. female  Chief Complaint  Patient presents with   Migraine    Increased Amovig to 140 mg. Has helped with the intensity of the migraines.    Depression/Anxiety    Managed well on current routine.    MIGRAINES Patient states her migraine intensity has improved since increasing dose of Aimovig  to 140mg .  Feels like her current dose of medication is working well for her.  Denies concerns regarding her migraines today.    WEIGHT MANAGEMENT Has been on Wegovy  over the last two years.  Has had a total weight loss of 220lbs.  However, weight loss has plateaued  over the last 3 months.    Relevant past medical, surgical, family and social history reviewed and updated as indicated. Interim medical history since our last visit reviewed. Allergies and medications reviewed and updated.  Review of Systems  Constitutional:  Positive for unexpected weight change.  Neurological:  Positive for headaches.    Per HPI unless specifically indicated above     Objective:    BP 98/63 (BP Location: Right Arm, Patient Position: Sitting, Cuff Size: Normal)   Pulse 83   Temp 98.7 F (37.1 C) (Oral)   Resp 15   Ht 5' 2.01 (1.575 m)   Wt 161 lb (73 kg)   LMP  (LMP Unknown)   SpO2 99%   BMI 29.44 kg/m   Wt Readings from Last 3 Encounters:  01/23/24 161 lb (73 kg)  10/19/23 165 lb 12.8 oz (75.2 kg)  07/24/23 164 lb 6.4 oz (74.6 kg)    Physical Exam Vitals and nursing note reviewed.  Constitutional:      General: She is not in acute distress.    Appearance: Normal appearance. She is not ill-appearing, toxic-appearing or  diaphoretic.  HENT:     Head: Normocephalic.     Right Ear: External ear normal.     Left Ear: External ear normal.     Nose: Nose normal.     Mouth/Throat:     Mouth: Mucous membranes are moist.     Pharynx: Oropharynx is clear.   Eyes:     General:        Right eye: No discharge.        Left eye: No discharge.     Extraocular Movements: Extraocular movements intact.     Conjunctiva/sclera: Conjunctivae normal.     Pupils: Pupils are equal, round, and reactive to light.    Cardiovascular:     Rate and Rhythm: Normal rate and regular rhythm.     Heart sounds: No murmur heard. Pulmonary:     Effort: Pulmonary effort is normal. No respiratory distress.     Breath sounds: Normal breath sounds. No wheezing or rales.   Musculoskeletal:     Cervical back: Normal range of motion and neck supple.   Skin:    General: Skin is warm and dry.     Capillary Refill: Capillary refill takes less than 2 seconds.   Neurological:  General: No focal deficit present.     Mental Status: She is alert and oriented to person, place, and time. Mental status is at baseline.   Psychiatric:        Mood and Affect: Mood normal.        Behavior: Behavior normal.        Thought Content: Thought content normal.        Judgment: Judgment normal.     Results for orders placed or performed in visit on 10/19/23  Microscopic Examination   Collection Time: 10/19/23  4:13 PM   Urine  Result Value Ref Range   WBC, UA 6-10 (A) 0 - 5 /hpf   RBC, Urine 3-10 (A) 0 - 2 /hpf   Epithelial Cells (non renal) 0-10 0 - 10 /hpf   Bacteria, UA Moderate (A) None seen/Few  Urinalysis, Routine w reflex microscopic   Collection Time: 10/19/23  4:13 PM  Result Value Ref Range   Specific Gravity, UA 1.020 1.005 - 1.030   pH, UA 6.0 5.0 - 7.5   Color, UA Yellow Yellow   Appearance Ur Slightly cloudy Clear   Leukocytes,UA 1+ (A) Negative   Protein,UA Negative Negative/Trace   Glucose, UA Negative Negative    Ketones, UA Negative Negative   RBC, UA Trace (A) Negative   Bilirubin, UA Negative Negative   Urobilinogen, Ur 1.0 0.2 - 1.0 mg/dL   Nitrite, UA Negative Negative   Microscopic Examination See below:   CBC with Differential/Platelet   Collection Time: 10/19/23  4:14 PM  Result Value Ref Range   WBC 7.6 3.4 - 10.8 x10E3/uL   RBC 4.27 3.77 - 5.28 x10E6/uL   Hemoglobin 13.2 11.1 - 15.9 g/dL   Hematocrit 16.1 09.6 - 46.6 %   MCV 95 79 - 97 fL   MCH 30.9 26.6 - 33.0 pg   MCHC 32.6 31.5 - 35.7 g/dL   RDW 04.5 40.9 - 81.1 %   Platelets 287 150 - 450 x10E3/uL   Neutrophils 58 Not Estab. %   Lymphs 33 Not Estab. %   Monocytes 5 Not Estab. %   Eos 3 Not Estab. %   Basos 1 Not Estab. %   Neutrophils Absolute 4.4 1.4 - 7.0 x10E3/uL   Lymphocytes Absolute 2.5 0.7 - 3.1 x10E3/uL   Monocytes Absolute 0.4 0.1 - 0.9 x10E3/uL   EOS (ABSOLUTE) 0.3 0.0 - 0.4 x10E3/uL   Basophils Absolute 0.0 0.0 - 0.2 x10E3/uL   Immature Granulocytes 0 Not Estab. %   Immature Grans (Abs) 0.0 0.0 - 0.1 x10E3/uL  Comprehensive metabolic panel   Collection Time: 10/19/23  4:14 PM  Result Value Ref Range   Glucose 77 70 - 99 mg/dL   BUN 16 6 - 24 mg/dL   Creatinine, Ser 9.14 (H) 0.57 - 1.00 mg/dL   eGFR 63 >78 GN/FAO/1.30   BUN/Creatinine Ratio 15 9 - 23   Sodium 138 134 - 144 mmol/L   Potassium 4.1 3.5 - 5.2 mmol/L   Chloride 105 96 - 106 mmol/L   CO2 19 (L) 20 - 29 mmol/L   Calcium 9.1 8.7 - 10.2 mg/dL   Total Protein 6.1 6.0 - 8.5 g/dL   Albumin 4.3 3.9 - 4.9 g/dL   Globulin, Total 1.8 1.5 - 4.5 g/dL   Bilirubin Total <8.6 0.0 - 1.2 mg/dL   Alkaline Phosphatase 49 44 - 121 IU/L   AST 13 0 - 40 IU/L   ALT 9 0 - 32 IU/L  Lipid  panel   Collection Time: 10/19/23  4:14 PM  Result Value Ref Range   Cholesterol, Total 149 100 - 199 mg/dL   Triglycerides 84 0 - 149 mg/dL   HDL 47 >16 mg/dL   VLDL Cholesterol Cal 16 5 - 40 mg/dL   LDL Chol Calc (NIH) 86 0 - 99 mg/dL   Chol/HDL Ratio 3.2 0.0 - 4.4 ratio   TSH   Collection Time: 10/19/23  4:14 PM  Result Value Ref Range   TSH 1.890 0.450 - 4.500 uIU/mL  Vitamin D  (25 hydroxy)   Collection Time: 10/19/23  4:14 PM  Result Value Ref Range   Vit D, 25-Hydroxy 52.3 30.0 - 100.0 ng/mL      Assessment & Plan:   Problem List Items Addressed This Visit       Cardiovascular and Mediastinum   Migraine - Primary   Chronic.  Controlled.  Continue with current medication regimen.  Return to clinic in 6 months for reevaluation.  Call sooner if concerns arise.        Relevant Medications   rizatriptan  (MAXALT -MLT) 10 MG disintegrating tablet     Other   Class 2 obesity due to excess calories without serious comorbidity with body mass index (BMI) of 36.0 to 36.9 in adult   Chronic.  Much improved since starting Wegovy .  Weight loss goal is 135lbs.  Will change from Wegovy  to Zepbound 10mg  weekly to help aid in weight loss.  Follow up in 3 months.  Call sooner if concerns arise.       Relevant Medications   tirzepatide (ZEPBOUND) 10 MG/0.5ML Pen     Follow up plan: Return in about 3 months (around 04/24/2024) for Medication Management.

## 2024-01-24 ENCOUNTER — Telehealth: Payer: Self-pay

## 2024-01-24 ENCOUNTER — Other Ambulatory Visit (HOSPITAL_COMMUNITY): Payer: Self-pay

## 2024-01-24 NOTE — Telephone Encounter (Signed)
 Pharmacy Patient Advocate Encounter   Received notification from CoverMyMeds that prior authorization for Zepbound 10MG /0.5ML pen-injectors is required/requested.   Insurance verification completed.   The patient is insured through [facility] .   Per test claim: PA required; PA submitted to above mentioned insurance via CoverMyMeds Key/confirmation #/EOC [id number] Status is pending

## 2024-01-24 NOTE — Telephone Encounter (Signed)
 Pharmacy Patient Advocate Encounter  Received notification from [facility] that Prior Authorization for Zepbound 10MG /0.5ML pen-injectors has been APPROVED from 01/24/2024 to 02/14/2024. Unable to obtain price due to refill too soon rejection, last fill date 01/24/2024 next available fill date 02/14/2024

## 2024-02-16 ENCOUNTER — Telehealth: Payer: Self-pay | Admitting: Nurse Practitioner

## 2024-02-16 MED ORDER — ZEPBOUND 10 MG/0.5ML ~~LOC~~ SOAJ: 10.0000 mg | SUBCUTANEOUS | 2 refills | Status: DC

## 2024-02-16 NOTE — Telephone Encounter (Signed)
Refill sent in for patient.  °

## 2024-02-16 NOTE — Telephone Encounter (Signed)
 Patient called the office and stated that she needs a refill on Zepbound  Please advise

## 2024-02-25 DIAGNOSIS — N132 Hydronephrosis with renal and ureteral calculous obstruction: Secondary | ICD-10-CM | POA: Diagnosis not present

## 2024-02-25 DIAGNOSIS — N200 Calculus of kidney: Secondary | ICD-10-CM | POA: Diagnosis not present

## 2024-02-25 DIAGNOSIS — N134 Hydroureter: Secondary | ICD-10-CM | POA: Diagnosis not present

## 2024-02-25 DIAGNOSIS — Z87442 Personal history of urinary calculi: Secondary | ICD-10-CM | POA: Diagnosis not present

## 2024-02-25 DIAGNOSIS — R109 Unspecified abdominal pain: Secondary | ICD-10-CM | POA: Diagnosis not present

## 2024-02-26 DIAGNOSIS — N134 Hydroureter: Secondary | ICD-10-CM | POA: Diagnosis not present

## 2024-02-26 DIAGNOSIS — N200 Calculus of kidney: Secondary | ICD-10-CM | POA: Diagnosis not present

## 2024-02-26 DIAGNOSIS — N132 Hydronephrosis with renal and ureteral calculous obstruction: Secondary | ICD-10-CM | POA: Diagnosis not present

## 2024-02-26 DIAGNOSIS — R109 Unspecified abdominal pain: Secondary | ICD-10-CM | POA: Diagnosis not present

## 2024-03-15 DIAGNOSIS — F32A Depression, unspecified: Secondary | ICD-10-CM | POA: Diagnosis not present

## 2024-03-15 DIAGNOSIS — N3090 Cystitis, unspecified without hematuria: Secondary | ICD-10-CM | POA: Diagnosis not present

## 2024-03-15 DIAGNOSIS — Z87442 Personal history of urinary calculi: Secondary | ICD-10-CM | POA: Diagnosis not present

## 2024-03-15 DIAGNOSIS — G43909 Migraine, unspecified, not intractable, without status migrainosus: Secondary | ICD-10-CM | POA: Diagnosis not present

## 2024-03-15 DIAGNOSIS — R109 Unspecified abdominal pain: Secondary | ICD-10-CM | POA: Diagnosis not present

## 2024-03-15 DIAGNOSIS — N136 Pyonephrosis: Secondary | ICD-10-CM | POA: Diagnosis not present

## 2024-03-15 DIAGNOSIS — E669 Obesity, unspecified: Secondary | ICD-10-CM | POA: Diagnosis not present

## 2024-03-15 DIAGNOSIS — N179 Acute kidney failure, unspecified: Secondary | ICD-10-CM | POA: Diagnosis not present

## 2024-03-15 DIAGNOSIS — N132 Hydronephrosis with renal and ureteral calculous obstruction: Secondary | ICD-10-CM | POA: Diagnosis not present

## 2024-03-15 DIAGNOSIS — F419 Anxiety disorder, unspecified: Secondary | ICD-10-CM | POA: Diagnosis not present

## 2024-03-15 DIAGNOSIS — N201 Calculus of ureter: Secondary | ICD-10-CM | POA: Diagnosis not present

## 2024-03-15 DIAGNOSIS — Z87891 Personal history of nicotine dependence: Secondary | ICD-10-CM | POA: Diagnosis not present

## 2024-03-15 DIAGNOSIS — Z7985 Long-term (current) use of injectable non-insulin antidiabetic drugs: Secondary | ICD-10-CM | POA: Diagnosis not present

## 2024-03-15 DIAGNOSIS — Z6829 Body mass index (BMI) 29.0-29.9, adult: Secondary | ICD-10-CM | POA: Diagnosis not present

## 2024-03-15 DIAGNOSIS — Z881 Allergy status to other antibiotic agents status: Secondary | ICD-10-CM | POA: Diagnosis not present

## 2024-03-15 DIAGNOSIS — N390 Urinary tract infection, site not specified: Secondary | ICD-10-CM | POA: Diagnosis not present

## 2024-03-16 DIAGNOSIS — N201 Calculus of ureter: Secondary | ICD-10-CM | POA: Diagnosis not present

## 2024-03-16 DIAGNOSIS — I498 Other specified cardiac arrhythmias: Secondary | ICD-10-CM | POA: Diagnosis not present

## 2024-03-16 DIAGNOSIS — Z960 Presence of urogenital implants: Secondary | ICD-10-CM | POA: Diagnosis not present

## 2024-03-17 DIAGNOSIS — N201 Calculus of ureter: Secondary | ICD-10-CM | POA: Diagnosis not present

## 2024-03-17 DIAGNOSIS — Z960 Presence of urogenital implants: Secondary | ICD-10-CM | POA: Diagnosis not present

## 2024-03-18 ENCOUNTER — Telehealth: Payer: Self-pay

## 2024-03-18 NOTE — Transitions of Care (Post Inpatient/ED Visit) (Signed)
 03/18/2024  Name: Mia Thompson MRN: [id number] DOB: 18-Feb-1976  Today's TOC FU Call Status: Today's TOC FU Call Status:: Successful TOC FU Call Completed TOC FU Call Complete Date: 03/18/24 Patient's Name and Date of Birth confirmed.  Transition Care Management Follow-up Telephone Call Date of Discharge: 03/17/24 Discharge Facility: Other ([facility]) Name of Other ([facility]) Discharge Facility: [facility] Type of Discharge: Inpatient Admission Primary Inpatient Discharge Diagnosis:: calculus ureter How have you been since you were released from the hospital?: Better Any questions or concerns?: No  Items Reviewed: Did you receive and understand the discharge instructions provided?: Yes Medications obtained,verified, and reconciled?: Yes (Medications Reviewed) Any new allergies since your discharge?: No Dietary orders reviewed?: Yes Do you have support at home?: Yes People in Home [RPT]: spouse  Medications Reviewed Today: Medications Reviewed Today     Reviewed by Emmitt Pan, LPN (Licensed Practical Nurse) on 03/18/24 at [phone]  Med List Status: <None>   Medication Order Taking? Sig Documenting Provider Last Dose Status Informant  ARIPiprazole  (ABILIFY ) 2 MG tablet [id number] Yes Take 1 tablet (2 mg total) by mouth daily. Melvin Pao, NP  Active   baclofen  (LIORESAL ) 10 MG tablet [id number] Yes Take 1 tablet (10 mg total) by mouth 2 (two) times daily as needed for muscle spasms. Melvin Pao, NP  Active   cetirizine (ZYRTEC) 10 MG tablet [id number] Yes Take 10 mg by mouth at bedtime. [provider]  Active Self  DULoxetine  (CYMBALTA ) 60 MG capsule [id number] Yes Take 1 capsule (60 mg total) by mouth daily. Melvin Pao, NP  Active   Erenumab -aooe (AIMOVIG ) 140 MG/ML EMMANUEL [id number] Yes Inject 140 mg into the skin every 30 (thirty) days. Melvin Pao, NP  Active   Lactobacillus Probiotic TABS [id number] Yes Take 5 Billion Cells by mouth daily.  Melvin Pao, NP  Active   Levonorgestrel -Ethinyl Estradiol  (CAMRESE) 0.15-0.03 &0.01 MG tablet [id number] Yes Take 1 tablet by mouth at bedtime. Sebastian Sham, CNM  Active   Multiple Vitamin (MULTI-VITAMINS) TABS [id number] Yes Take 1 tablet by mouth daily.  [provider]  Active Self           Med Note DELETA DEBBY JONELLE [place] May 04, 2017 12:33 PM)    naproxen  (NAPROSYN ) 500 MG tablet [id number] Yes Take 1 tablet (500 mg total) by mouth 2 (two) times daily as needed for headache. Mecum, Erin E, PA-C  Active   pantoprazole  (PROTONIX ) 40 MG tablet [id number] Yes Take 1 tablet (40 mg total) by mouth daily. Melvin Pao, NP  Active   promethazine  (PHENERGAN ) 25 MG tablet [id number] Yes Take 1 tablet (25 mg total) by mouth every 8 (eight) hours as needed for nausea or vomiting. Melvin Pao, NP  Active   rizatriptan  (MAXALT -MLT) 10 MG disintegrating tablet [id number] Yes Take 1 tablet (10 mg total) by mouth as needed for migraine. May repeat in 2 hours if needed Melvin Pao, NP  Active   tirzepatide  (ZEPBOUND ) 10 MG/0.5ML Pen [id number] Yes Inject 10 mg into the skin once a week. Melvin Pao, NP  Active   topiramate  (TOPAMAX ) 200 MG tablet [id number] Yes Take 1 tablet (200 mg total) by mouth daily. Melvin Pao, NP  Active   Vitamin D , Ergocalciferol , 50000 units CAPS [id number] Yes Take 1 capsule (50,000 Units total) by mouth every 7 (seven) days. Melvin Pao, NP  Active             Home Care and Equipment/Supplies: Were Home Health Services Ordered?: NA Any new equipment  or medical supplies ordered?: NA  Functional Questionnaire: Do you need assistance with bathing/showering or dressing?: No Do you need assistance with meal preparation?: No Do you need assistance with eating?: No Do you have difficulty maintaining continence: No Do you need assistance with getting out of bed/getting out of a chair/moving?: No Do you have difficulty managing or  taking your medications?: No  Follow up appointments reviewed: PCP Follow-up appointment confirmed?: Yes Date of PCP follow-up appointment?: 03/25/24 Follow-up Provider: [facility] Follow-up appointment confirmed?: Yes Date of Specialist follow-up appointment?: 03/21/24 Follow-Up Specialty Provider:: nephro Do you need transportation to your follow-up appointment?: No Do you understand care options if your condition(s) worsen?: Yes-patient verbalized understanding    SIGNATURE Julian Lemmings, LPN [facility] Nurse Health Advisor Direct Dial [phone]

## 2024-03-18 NOTE — Telephone Encounter (Signed)
 Noted

## 2024-03-25 ENCOUNTER — Inpatient Hospital Stay: Admitting: Nurse Practitioner

## 2024-03-25 DIAGNOSIS — N200 Calculus of kidney: Secondary | ICD-10-CM | POA: Diagnosis not present

## 2024-03-25 DIAGNOSIS — Z960 Presence of urogenital implants: Secondary | ICD-10-CM | POA: Diagnosis not present

## 2024-04-09 ENCOUNTER — Other Ambulatory Visit: Payer: Self-pay | Admitting: Nurse Practitioner

## 2024-04-10 NOTE — Telephone Encounter (Signed)
 Requested medication (s) are due for refill today: na   Requested medication (s) are on the active medication list: yes   Last refill:  10/19/23 #1.12 ml 4 refills  Future visit scheduled: yes 04/24/24  Notes to clinic:  medication not assigned to a protocol. Do you want to refill Rx?     Requested Prescriptions  Pending Prescriptions Disp Refills   AIMOVIG  140 MG/ML SOAJ [Pharmacy Med Name: AIMOVIG  140 MG/ML AUTOINJECTOR] 1 mL 0    Sig: Inject 140 mg into the skin every 30 (thirty) days.     Off-Protocol Failed - 04/10/2024 11:06 AM      Failed - Medication not assigned to a protocol, review manually.      Passed - Valid encounter within last 12 months    Recent Outpatient Visits           2 months ago Chronic migraine without aura without status migrainosus, not intractable   [place] [facility] Melvin Pao, NP   5 months ago Annual physical exam   [place] [facility] Melvin Pao, NP

## 2024-04-17 ENCOUNTER — Other Ambulatory Visit: Payer: Self-pay

## 2024-04-17 MED ORDER — LEVONORGEST-ETH ESTRAD 91-DAY 0.15-0.03 &0.01 MG PO TABS: 1.0000 | ORAL_TABLET | Freq: Every day | ORAL | 0 refills | Status: DC

## 2024-04-24 ENCOUNTER — Ambulatory Visit: Admitting: Nurse Practitioner

## 2024-04-24 ENCOUNTER — Encounter: Payer: Self-pay | Admitting: Nurse Practitioner

## 2024-04-24 VITALS — BP 101/67 | HR 79 | Temp 94.6°F | Ht 62.0 in | Wt 156.4 lb

## 2024-04-24 DIAGNOSIS — F411 Generalized anxiety disorder: Secondary | ICD-10-CM

## 2024-04-24 DIAGNOSIS — E66812 Obesity, class 2: Secondary | ICD-10-CM

## 2024-04-24 DIAGNOSIS — E538 Deficiency of other specified B group vitamins: Secondary | ICD-10-CM | POA: Diagnosis not present

## 2024-04-24 DIAGNOSIS — E6609 Other obesity due to excess calories: Secondary | ICD-10-CM | POA: Diagnosis not present

## 2024-04-24 DIAGNOSIS — Z6836 Body mass index (BMI) 36.0-36.9, adult: Secondary | ICD-10-CM | POA: Diagnosis not present

## 2024-04-24 DIAGNOSIS — E559 Vitamin D deficiency, unspecified: Secondary | ICD-10-CM

## 2024-04-24 DIAGNOSIS — G43709 Chronic migraine without aura, not intractable, without status migrainosus: Secondary | ICD-10-CM

## 2024-04-24 DIAGNOSIS — F331 Major depressive disorder, recurrent, moderate: Secondary | ICD-10-CM | POA: Diagnosis not present

## 2024-04-24 MED ORDER — ARIPIPRAZOLE 2 MG PO TABS: 2.0000 mg | ORAL_TABLET | Freq: Every day | ORAL | 1 refills | Status: AC

## 2024-04-24 MED ORDER — RIZATRIPTAN BENZOATE 10 MG PO TBDP: 10.0000 mg | ORAL_TABLET | ORAL | 2 refills | Status: AC | PRN

## 2024-04-24 MED ORDER — ZEPBOUND 10 MG/0.5ML ~~LOC~~ SOAJ: 10.0000 mg | SUBCUTANEOUS | 2 refills | Status: DC

## 2024-04-24 MED ORDER — TOPIRAMATE 25 MG PO TABS: 25.0000 mg | ORAL_TABLET | Freq: Every day | ORAL | 0 refills | Status: DC

## 2024-04-24 MED ORDER — PANTOPRAZOLE SODIUM 40 MG PO TBEC: 40.0000 mg | DELAYED_RELEASE_TABLET | Freq: Every day | ORAL | 1 refills | Status: AC

## 2024-04-24 MED ORDER — TOPIRAMATE 50 MG PO TABS: 50.0000 mg | ORAL_TABLET | Freq: Every day | ORAL | 0 refills | Status: DC

## 2024-04-24 MED ORDER — AIMOVIG 140 MG/ML ~~LOC~~ SOAJ: 140.0000 mg | SUBCUTANEOUS | 0 refills | Status: DC

## 2024-04-24 MED ORDER — NAPROXEN 500 MG PO TABS: 500.0000 mg | ORAL_TABLET | Freq: Two times a day (BID) | ORAL | 0 refills | Status: AC | PRN

## 2024-04-24 MED ORDER — TOPIRAMATE 100 MG PO TABS: 100.0000 mg | ORAL_TABLET | Freq: Every day | ORAL | 0 refills | Status: DC

## 2024-04-24 MED ORDER — PROMETHAZINE HCL 25 MG PO TABS: 25.0000 mg | ORAL_TABLET | Freq: Three times a day (TID) | ORAL | 2 refills | Status: AC | PRN

## 2024-04-24 MED ORDER — DULOXETINE HCL 60 MG PO CPEP: 60.0000 mg | ORAL_CAPSULE | Freq: Every day | ORAL | 1 refills | Status: AC

## 2024-04-24 MED ORDER — BACLOFEN 10 MG PO TABS: 10.0000 mg | ORAL_TABLET | Freq: Two times a day (BID) | ORAL | 1 refills | Status: AC | PRN

## 2024-04-24 NOTE — Assessment & Plan Note (Signed)
 Labs ordered at visit today.  Will make recommendations based on lab results.

## 2024-04-24 NOTE — Assessment & Plan Note (Signed)
 Chronic.  Doing well with Zepbound .  Has lost 5lbs since last visit.  Almost to her weight loss goal.  Continue with current regimen.  Follow up in 3 months.  Call sooner if concerns arise.

## 2024-04-24 NOTE — Assessment & Plan Note (Signed)
 Chronic.  Controlled.  Continue with current medication regimen of Abilify and Duloxetine daily.  Refills sent today.  Labs ordered today.  Return to clinic in 6 months for reevaluation.  Call sooner if concerns arise.

## 2024-04-24 NOTE — Progress Notes (Signed)
 BP 101/67 (BP Location: Right Arm, Patient Position: Sitting, Cuff Size: Normal)   Pulse 79   Temp (!) 94.6 F (34.8 C)   Ht 5' 2 (1.575 m)   Wt 156 lb 6.4 oz (70.9 kg)   SpO2 100%   BMI 28.61 kg/m    Subjective:    Patient ID: Mia Thompson, female    DOB: Nov 13, 1975, 48 y.o.   MRN: [id number]  HPI: Mia Thompson is a 48 y.o. female  Chief Complaint  Patient presents with   Medication Refill    Pt presents today for a medication zepbound . Will need a refill on all of her medicines   MIGRAINES Patient states her migraine intensity has improved since increasing dose of Aimovig  to 140mg .  Feels like her current dose of medication is working well for her.  Denies concerns regarding her migraines today.    WEIGHT MANAGEMENT Has been on Zepbound  and has lost another 5lbs since our last visit.  She is on 10mg  and will continue with this dose.   Patient states she has had two kidney stones in the last month. She had to have stent put in. She wants to go off the topamax  due to it likely being the cause of the kidney stones.   Relevant past medical, surgical, family and social history reviewed and updated as indicated. Interim medical history since our last visit reviewed. Allergies and medications reviewed and updated.  Review of Systems  Constitutional:  Positive for unexpected weight change.  Neurological:  Positive for headaches.    Per HPI unless specifically indicated above     Objective:    BP 101/67 (BP Location: Right Arm, Patient Position: Sitting, Cuff Size: Normal)   Pulse 79   Temp (!) 94.6 F (34.8 C)   Ht 5' 2 (1.575 m)   Wt 156 lb 6.4 oz (70.9 kg)   SpO2 100%   BMI 28.61 kg/m   Wt Readings from Last 3 Encounters:  04/24/24 156 lb 6.4 oz (70.9 kg)  01/23/24 161 lb (73 kg)  10/19/23 165 lb 12.8 oz (75.2 kg)    Physical Exam Vitals and nursing note reviewed.  Constitutional:      General: She is not in acute distress.    Appearance: Normal  appearance. She is not ill-appearing, toxic-appearing or diaphoretic.  HENT:     Head: Normocephalic.     Right Ear: External ear normal.     Left Ear: External ear normal.     Nose: Nose normal.     Mouth/Throat:     Mouth: Mucous membranes are moist.     Pharynx: Oropharynx is clear.  Eyes:     General:        Right eye: No discharge.        Left eye: No discharge.     Extraocular Movements: Extraocular movements intact.     Conjunctiva/sclera: Conjunctivae normal.     Pupils: Pupils are equal, round, and reactive to light.  Cardiovascular:     Rate and Rhythm: Normal rate and regular rhythm.     Heart sounds: No murmur heard. Pulmonary:     Effort: Pulmonary effort is normal. No respiratory distress.     Breath sounds: Normal breath sounds. No wheezing or rales.  Musculoskeletal:     Cervical back: Normal range of motion and neck supple.  Skin:    General: Skin is warm and dry.     Capillary Refill: Capillary refill takes less than 2 seconds.  Neurological:     General: No focal deficit present.     Mental Status: She is alert and oriented to person, place, and time. Mental status is at baseline.  Psychiatric:        Mood and Affect: Mood normal.        Behavior: Behavior normal.        Thought Content: Thought content normal.        Judgment: Judgment normal.     Results for orders placed or performed in visit on 10/19/23  Microscopic Examination   Collection Time: 10/19/23  4:13 PM   Urine  Result Value Ref Range   WBC, UA 6-10 (A) 0 - 5 /hpf   RBC, Urine 3-10 (A) 0 - 2 /hpf   Epithelial Cells (non renal) 0-10 0 - 10 /hpf   Bacteria, UA Moderate (A) None seen/Few  Urinalysis, Routine w reflex microscopic   Collection Time: 10/19/23  4:13 PM  Result Value Ref Range   Specific Gravity, UA 1.020 1.005 - 1.030   pH, UA 6.0 5.0 - 7.5   Color, UA Yellow Yellow   Appearance Ur Slightly cloudy Clear   Leukocytes,UA 1+ (A) Negative   Protein,UA Negative  Negative/Trace   Glucose, UA Negative Negative   Ketones, UA Negative Negative   RBC, UA Trace (A) Negative   Bilirubin, UA Negative Negative   Urobilinogen, Ur 1.0 0.2 - 1.0 mg/dL   Nitrite, UA Negative Negative   Microscopic Examination See below:   CBC with Differential/Platelet   Collection Time: 10/19/23  4:14 PM  Result Value Ref Range   WBC 7.6 3.4 - 10.8 x10E3/uL   RBC 4.27 3.77 - 5.28 x10E6/uL   Hemoglobin 13.2 11.1 - 15.9 g/dL   Hematocrit 59.4 65.9 - 46.6 %   MCV 95 79 - 97 fL   MCH 30.9 26.6 - 33.0 pg   MCHC 32.6 31.5 - 35.7 g/dL   RDW 87.9 88.2 - 84.5 %   Platelets 287 150 - 450 x10E3/uL   Neutrophils 58 Not Estab. %   Lymphs 33 Not Estab. %   Monocytes 5 Not Estab. %   Eos 3 Not Estab. %   Basos 1 Not Estab. %   Neutrophils Absolute 4.4 1.4 - 7.0 x10E3/uL   Lymphocytes Absolute 2.5 0.7 - 3.1 x10E3/uL   Monocytes Absolute 0.4 0.1 - 0.9 x10E3/uL   EOS (ABSOLUTE) 0.3 0.0 - 0.4 x10E3/uL   Basophils Absolute 0.0 0.0 - 0.2 x10E3/uL   Immature Granulocytes 0 Not Estab. %   Immature Grans (Abs) 0.0 0.0 - 0.1 x10E3/uL  Comprehensive metabolic panel   Collection Time: 10/19/23  4:14 PM  Result Value Ref Range   Glucose 77 70 - 99 mg/dL   BUN 16 6 - 24 mg/dL   Creatinine, Ser 8.89 (H) 0.57 - 1.00 mg/dL   eGFR 63 >40 fO/fpw/8.26   BUN/Creatinine Ratio 15 9 - 23   Sodium 138 134 - 144 mmol/L   Potassium 4.1 3.5 - 5.2 mmol/L   Chloride 105 96 - 106 mmol/L   CO2 19 (L) 20 - 29 mmol/L   Calcium 9.1 8.7 - 10.2 mg/dL   Total Protein 6.1 6.0 - 8.5 g/dL   Albumin 4.3 3.9 - 4.9 g/dL   Globulin, Total 1.8 1.5 - 4.5 g/dL   Bilirubin Total <9.7 0.0 - 1.2 mg/dL   Alkaline Phosphatase 49 44 - 121 IU/L   AST 13 0 - 40 IU/L   ALT 9 0 -  32 IU/L  Lipid panel   Collection Time: 10/19/23  4:14 PM  Result Value Ref Range   Cholesterol, Total 149 100 - 199 mg/dL   Triglycerides 84 0 - 149 mg/dL   HDL 47 >60 mg/dL   VLDL Cholesterol Cal 16 5 - 40 mg/dL   LDL Chol Calc (NIH) 86 0  - 99 mg/dL   Chol/HDL Ratio 3.2 0.0 - 4.4 ratio  TSH   Collection Time: 10/19/23  4:14 PM  Result Value Ref Range   TSH 1.890 0.450 - 4.500 uIU/mL  Vitamin D  (25 hydroxy)   Collection Time: 10/19/23  4:14 PM  Result Value Ref Range   Vit D, 25-Hydroxy 52.3 30.0 - 100.0 ng/mL      Assessment & Plan:   Problem List Items Addressed This Visit       Cardiovascular and Mediastinum   Migraine   Chronic. Will wean down topamax  due to kidney stones.  Will start 100mg  nightly for two weeks.  Then wean down to 50mg  nightly for two weeks.  After that wean down to 25mg  nightly for two weeks.  Continue with Aimovig  for migraine prevention.  Follow up in 3 months.  Call sooner if concerns arise.      Relevant Medications   topiramate  (TOPAMAX ) 100 MG tablet   topiramate  (TOPAMAX ) 50 MG tablet   topiramate  (TOPAMAX ) 25 MG tablet   Erenumab -aooe (AIMOVIG ) 140 MG/ML SOAJ   baclofen  (LIORESAL ) 10 MG tablet   DULoxetine  (CYMBALTA ) 60 MG capsule   naproxen  (NAPROSYN ) 500 MG tablet   rizatriptan  (MAXALT -MLT) 10 MG disintegrating tablet     Other   Anxiety, generalized   Chronic.  Controlled.  Continue with current medication regimen of Abilify  and Duloxetine  daily.  Refills sent today.  Labs ordered today.  Return to clinic in 6 months for reevaluation.  Call sooner if concerns arise.       Relevant Medications   DULoxetine  (CYMBALTA ) 60 MG capsule   B12 deficiency   Labs ordered at visit today.  Will make recommendations based on lab results.        Relevant Orders   B12   Depression   Chronic.  Controlled.  Continue with current medication regimen of Abilify  and Duloxetine  daily.  Refills sent today.  Labs ordered today.  Return to clinic in 6 months for reevaluation.  Call sooner if concerns arise.       Relevant Medications   DULoxetine  (CYMBALTA ) 60 MG capsule   Class 2 obesity due to excess calories without serious comorbidity with body mass index (BMI) of 36.0 to 36.9 in adult    Chronic.  Doing well with Zepbound .  Has lost 5lbs since last visit.  Almost to her weight loss goal.  Continue with current regimen.  Follow up in 3 months.  Call sooner if concerns arise.       Relevant Medications   tirzepatide  (ZEPBOUND ) 10 MG/0.5ML Pen   Other Relevant Orders   Comprehensive metabolic panel with GFR   Vitamin D  deficiency - Primary   Labs ordered at visit today.  Will make recommendations based on lab results.        Relevant Orders   Vitamin D  (25 hydroxy)      Follow up plan: Return in about 3 months (around 07/24/2024) for Migraines .

## 2024-04-24 NOTE — Assessment & Plan Note (Signed)
 Chronic. Will wean down topamax  due to kidney stones.  Will start 100mg  nightly for two weeks.  Then wean down to 50mg  nightly for two weeks.  After that wean down to 25mg  nightly for two weeks.  Continue with Aimovig  for migraine prevention.  Follow up in 3 months.  Call sooner if concerns arise.

## 2024-04-25 ENCOUNTER — Ambulatory Visit: Payer: Self-pay | Admitting: Nurse Practitioner

## 2024-04-25 LAB — COMPREHENSIVE METABOLIC PANEL WITH GFR
ALT: 10 IU/L (ref 0–32)
AST: 12 IU/L (ref 0–40)
Albumin: 4.3 g/dL (ref 3.9–4.9)
Alkaline Phosphatase: 53 IU/L (ref 41–116)
BUN/Creatinine Ratio: 12 (ref 9–23)
BUN: 13 mg/dL (ref 6–24)
Bilirubin Total: 0.2 mg/dL (ref 0.0–1.2)
CO2: 20 mmol/L (ref 20–29)
Calcium: 9.1 mg/dL (ref 8.7–10.2)
Chloride: 109 mmol/L — ABNORMAL HIGH (ref 96–106)
Creatinine, Ser: 1.08 mg/dL — ABNORMAL HIGH (ref 0.57–1.00)
Globulin, Total: 2.2 g/dL (ref 1.5–4.5)
Glucose: 54 mg/dL — ABNORMAL LOW (ref 70–99)
Potassium: 3.9 mmol/L (ref 3.5–5.2)
Sodium: 141 mmol/L (ref 134–144)
Total Protein: 6.5 g/dL (ref 6.0–8.5)
eGFR: 64 mL/min/1.73

## 2024-04-25 LAB — VITAMIN B12: Vitamin B-12: 958 pg/mL (ref 232–1245)

## 2024-04-25 LAB — VITAMIN D 25 HYDROXY (VIT D DEFICIENCY, FRACTURES): Vit D, 25-Hydroxy: 44 ng/mL (ref 30.0–100.0)

## 2024-05-02 DIAGNOSIS — N200 Calculus of kidney: Secondary | ICD-10-CM | POA: Diagnosis not present

## 2024-05-31 ENCOUNTER — Encounter: Payer: Self-pay | Admitting: Nurse Practitioner

## 2024-05-31 MED ORDER — ZEPBOUND 12.5 MG/0.5ML ~~LOC~~ SOAJ: 12.5000 mg | SUBCUTANEOUS | 2 refills | Status: DC

## 2024-06-04 ENCOUNTER — Ambulatory Visit: Admitting: Certified Nurse Midwife

## 2024-06-17 ENCOUNTER — Other Ambulatory Visit: Payer: Self-pay | Admitting: Nurse Practitioner

## 2024-06-19 NOTE — Telephone Encounter (Signed)
 Requested medication (s) are due for refill today- yes  Requested medication (s) are on the active medication list -yes  Future visit scheduled -yes  Last refill: 04/24/24 1ml  Notes to clinic: off protocol- provider review   Requested Prescriptions  Pending Prescriptions Disp Refills   AIMOVIG  140 MG/ML SOAJ [Pharmacy Med Name: AIMOVIG  140 MG/ML AUTOINJECTOR] 1 mL 0    Sig: Inject 140 mg into the skin every 30 (thirty) days.     Off-Protocol Failed - 06/19/2024  2:02 PM      Failed - Medication not assigned to a protocol, review manually.      Passed - Valid encounter within last 12 months    Recent Outpatient Visits           1 month ago Vitamin D  deficiency   [place] [facility] Bowman, Darice, NP   4 months ago Chronic migraine without aura without status migrainosus, not intractable   [facility] Melvin Darice, NP   8 months ago Annual physical exam   [facility] Melvin Darice, NP                 Requested Prescriptions  Pending Prescriptions Disp Refills   AIMOVIG  140 MG/ML SOAJ [Pharmacy Med Name: AIMOVIG  140 MG/ML AUTOINJECTOR] 1 mL 0    Sig: Inject 140 mg into the skin every 30 (thirty) days.     Off-Protocol Failed - 06/19/2024  2:02 PM      Failed - Medication not assigned to a protocol, review manually.      Passed - Valid encounter within last 12 months    Recent Outpatient Visits           1 month ago Vitamin D  deficiency   [place] [facility] Melvin Darice, NP   4 months ago Chronic migraine without aura without status migrainosus, not intractable   [place] [facility] Melvin Darice, NP   8 months ago Annual physical exam   [place] [facility] Melvin Darice, NP

## 2024-07-01 DIAGNOSIS — K810 Acute cholecystitis: Secondary | ICD-10-CM | POA: Diagnosis not present

## 2024-07-01 DIAGNOSIS — Z87442 Personal history of urinary calculi: Secondary | ICD-10-CM | POA: Diagnosis not present

## 2024-07-01 DIAGNOSIS — K219 Gastro-esophageal reflux disease without esophagitis: Secondary | ICD-10-CM | POA: Diagnosis not present

## 2024-07-01 DIAGNOSIS — R1010 Upper abdominal pain, unspecified: Secondary | ICD-10-CM | POA: Diagnosis not present

## 2024-07-01 DIAGNOSIS — K8000 Calculus of gallbladder with acute cholecystitis without obstruction: Secondary | ICD-10-CM | POA: Diagnosis not present

## 2024-07-01 DIAGNOSIS — Z7985 Long-term (current) use of injectable non-insulin antidiabetic drugs: Secondary | ICD-10-CM | POA: Diagnosis not present

## 2024-07-01 DIAGNOSIS — Z3202 Encounter for pregnancy test, result negative: Secondary | ICD-10-CM | POA: Diagnosis not present

## 2024-07-01 DIAGNOSIS — F419 Anxiety disorder, unspecified: Secondary | ICD-10-CM | POA: Diagnosis not present

## 2024-07-01 DIAGNOSIS — Z79899 Other long term (current) drug therapy: Secondary | ICD-10-CM | POA: Diagnosis not present

## 2024-07-02 DIAGNOSIS — K8000 Calculus of gallbladder with acute cholecystitis without obstruction: Secondary | ICD-10-CM | POA: Diagnosis not present

## 2024-07-02 DIAGNOSIS — K219 Gastro-esophageal reflux disease without esophagitis: Secondary | ICD-10-CM | POA: Diagnosis not present

## 2024-07-02 DIAGNOSIS — K8010 Calculus of gallbladder with chronic cholecystitis without obstruction: Secondary | ICD-10-CM | POA: Diagnosis not present

## 2024-07-02 DIAGNOSIS — K810 Acute cholecystitis: Secondary | ICD-10-CM | POA: Diagnosis not present

## 2024-07-02 DIAGNOSIS — R109 Unspecified abdominal pain: Secondary | ICD-10-CM | POA: Diagnosis not present

## 2024-07-02 HISTORY — PX: CHOLECYSTECTOMY: SHX55

## 2024-07-03 DIAGNOSIS — K810 Acute cholecystitis: Secondary | ICD-10-CM | POA: Diagnosis not present

## 2024-07-18 ENCOUNTER — Other Ambulatory Visit: Payer: Self-pay | Admitting: Medical Genetics

## 2024-07-22 ENCOUNTER — Other Ambulatory Visit: Payer: Self-pay

## 2024-07-22 DIAGNOSIS — Z3041 Encounter for surveillance of contraceptive pills: Secondary | ICD-10-CM

## 2024-07-22 MED ORDER — LEVONORGEST-ETH ESTRAD 91-DAY 0.15-0.03 &0.01 MG PO TABS: 1.0000 | ORAL_TABLET | Freq: Every day | ORAL | 0 refills | Status: DC

## 2024-07-22 NOTE — Progress Notes (Signed)
 Refill of OCPs until annual exam 08/16/24.

## 2024-07-24 ENCOUNTER — Telehealth: Payer: Self-pay

## 2024-07-24 ENCOUNTER — Other Ambulatory Visit (HOSPITAL_COMMUNITY): Payer: Self-pay

## 2024-07-24 NOTE — Telephone Encounter (Signed)
 Pharmacy Patient Advocate Encounter   Received notification from CoverMyMeds that prior authorization for Zepbound  10mg /0.38ml is required/requested.   Insurance verification completed.   The patient is insured through [facility].   Per test claim: PA required; PA started via CoverMyMeds. KEY [id number] . Waiting for clinical questions to populate.

## 2024-07-24 NOTE — Telephone Encounter (Signed)
 We will need the patient's current body weight to be able to submit the prior authorization.     Please advise

## 2024-07-25 ENCOUNTER — Ambulatory Visit: Admitting: Nurse Practitioner

## 2024-07-25 ENCOUNTER — Encounter: Payer: Self-pay | Admitting: Nurse Practitioner

## 2024-07-25 VITALS — BP 98/66 | HR 86 | Temp 97.4°F | Ht 62.0 in | Wt 165.0 lb

## 2024-07-25 DIAGNOSIS — E6609 Other obesity due to excess calories: Secondary | ICD-10-CM

## 2024-07-25 DIAGNOSIS — G43709 Chronic migraine without aura, not intractable, without status migrainosus: Secondary | ICD-10-CM | POA: Diagnosis not present

## 2024-07-25 DIAGNOSIS — M79642 Pain in left hand: Secondary | ICD-10-CM

## 2024-07-25 DIAGNOSIS — E66812 Obesity, class 2: Secondary | ICD-10-CM | POA: Diagnosis not present

## 2024-07-25 DIAGNOSIS — Z6836 Body mass index (BMI) 36.0-36.9, adult: Secondary | ICD-10-CM | POA: Diagnosis not present

## 2024-07-25 MED ORDER — ZEPBOUND 15 MG/0.5ML ~~LOC~~ SOAJ: 15.0000 mg | SUBCUTANEOUS | 2 refills | Status: AC

## 2024-07-25 NOTE — Telephone Encounter (Signed)
 Clinical questions answered and PA submitted.

## 2024-07-25 NOTE — Assessment & Plan Note (Signed)
 Chronic.  Doing well with Zepbound .  Has gained 10lbs since last visit.  Will increase dose of Zepbound  to 15mg  weekly.  Continue with current regimen.  Follow up in 3 months.  Call sooner if concerns arise.

## 2024-07-25 NOTE — Assessment & Plan Note (Signed)
 Chronic. Will wean down topamax  due to kidney stones.  Will start 100mg  nightly for two weeks.  Then wean down to 50mg  nightly for two weeks.  After that wean down to 25mg  nightly for two weeks.  Continue with Aimovig  for migraine prevention.  Follow up in 3 months.  Call sooner if concerns arise.

## 2024-07-25 NOTE — Progress Notes (Signed)
 BP 98/66   Pulse 86   Temp (!) 97.4 F (36.3 C) (Oral)   Ht 5' 2 (1.575 m)   Wt 165 lb (74.8 kg)   LMP  (LMP Unknown)   SpO2 98%   BMI 30.18 kg/m    Subjective:    Patient ID: Mia Thompson, female    DOB: 1976/06/27, 48 y.o.   MRN: [id number]  HPI: Mia Thompson is a 48 y.o. female  Chief Complaint  Patient presents with   Migraine        MIGRAINES Patient states her migraine intensity has improved since increasing dose of Aimovig  to 140mg .  Feels like her current dose of medication is working well for her.  Denies concerns regarding her migraines today.    WEIGHT MANAGEMENT Has been on Zepbound  12.5mg .  She was off the medication for a couple of weeks due to surgery and being out of the medication.  However, weight loss has plateaued  over the last 3 months.  She would like to go to the 15mg  dose.    HAND PAIN Patient states she has been pain near her left thumb.  There is a little redness and swelling.  She had an IV from getting her gallbladder taken out and it is right where her IV was.   Relevant past medical, surgical, family and social history reviewed and updated as indicated. Interim medical history since our last visit reviewed. Allergies and medications reviewed and updated.  Review of Systems  Constitutional:  Positive for unexpected weight change.  Musculoskeletal:        Left hand pain  Neurological:  Positive for headaches.    Per HPI unless specifically indicated above     Objective:    BP 98/66   Pulse 86   Temp (!) 97.4 F (36.3 C) (Oral)   Ht 5' 2 (1.575 m)   Wt 165 lb (74.8 kg)   LMP  (LMP Unknown)   SpO2 98%   BMI 30.18 kg/m   Wt Readings from Last 3 Encounters:  07/25/24 165 lb (74.8 kg)  04/24/24 156 lb 6.4 oz (70.9 kg)  01/23/24 161 lb (73 kg)    Physical Exam Vitals and nursing note reviewed.  Constitutional:      General: She is not in acute distress.    Appearance: Normal appearance. She is not ill-appearing,  toxic-appearing or diaphoretic.  HENT:     Head: Normocephalic.     Right Ear: External ear normal.     Left Ear: External ear normal.     Nose: Nose normal.     Mouth/Throat:     Mouth: Mucous membranes are moist.     Pharynx: Oropharynx is clear.  Eyes:     General:        Right eye: No discharge.        Left eye: No discharge.     Extraocular Movements: Extraocular movements intact.     Conjunctiva/sclera: Conjunctivae normal.     Pupils: Pupils are equal, round, and reactive to light.  Cardiovascular:     Rate and Rhythm: Normal rate and regular rhythm.     Heart sounds: No murmur heard. Pulmonary:     Effort: Pulmonary effort is normal. No respiratory distress.     Breath sounds: Normal breath sounds. No wheezing or rales.  Musculoskeletal:     Cervical back: Normal range of motion and neck supple.     Comments: Left hand erythema over left thumb  Skin:  General: Skin is warm and dry.     Capillary Refill: Capillary refill takes less than 2 seconds.  Neurological:     General: No focal deficit present.     Mental Status: She is alert and oriented to person, place, and time. Mental status is at baseline.  Psychiatric:        Mood and Affect: Mood normal.        Behavior: Behavior normal.        Thought Content: Thought content normal.        Judgment: Judgment normal.     Results for orders placed or performed in visit on 04/24/24  Comprehensive metabolic panel with GFR   Collection Time: 04/24/24  3:02 PM  Result Value Ref Range   Glucose 54 (L) 70 - 99 mg/dL   BUN 13 6 - 24 mg/dL   Creatinine, Ser 8.91 (H) 0.57 - 1.00 mg/dL   eGFR 64 >40 fO/fpw/8.26   BUN/Creatinine Ratio 12 9 - 23   Sodium 141 134 - 144 mmol/L   Potassium 3.9 3.5 - 5.2 mmol/L   Chloride 109 (H) 96 - 106 mmol/L   CO2 20 20 - 29 mmol/L   Calcium 9.1 8.7 - 10.2 mg/dL   Total Protein 6.5 6.0 - 8.5 g/dL   Albumin 4.3 3.9 - 4.9 g/dL   Globulin, Total 2.2 1.5 - 4.5 g/dL   Bilirubin Total 0.2  0.0 - 1.2 mg/dL   Alkaline Phosphatase 53 41 - 116 IU/L   AST 12 0 - 40 IU/L   ALT 10 0 - 32 IU/L  Vitamin D  (25 hydroxy)   Collection Time: 04/24/24  3:02 PM  Result Value Ref Range   Vit D, 25-Hydroxy 44.0 30.0 - 100.0 ng/mL  B12   Collection Time: 04/24/24  3:02 PM  Result Value Ref Range   Vitamin B-12 958 232 - 1,245 pg/mL      Assessment & Plan:   Problem List Items Addressed This Visit       Cardiovascular and Mediastinum   Migraine - Primary   Chronic. Will wean down topamax  due to kidney stones.  Will start 100mg  nightly for two weeks.  Then wean down to 50mg  nightly for two weeks.  After that wean down to 25mg  nightly for two weeks.  Continue with Aimovig  for migraine prevention.  Follow up in 3 months.  Call sooner if concerns arise.        Other   Class 2 obesity due to excess calories without serious comorbidity with body mass index (BMI) of 36.0 to 36.9 in adult   Chronic.  Doing well with Zepbound .  Has gained 10lbs since last visit.  Will increase dose of Zepbound  to 15mg  weekly.  Continue with current regimen.  Follow up in 3 months.  Call sooner if concerns arise.       Relevant Medications   tirzepatide  (ZEPBOUND ) 15 MG/0.5ML Pen   Other Visit Diagnoses       Pain of left hand       Suspect it is from her IV and antibiotics received during hospitalization. Discussed that symptoms should resolve on their own.        Follow up plan: Return in about 3 months (around 10/23/2024) for Weight Managment.

## 2024-08-06 ENCOUNTER — Other Ambulatory Visit: Payer: Self-pay

## 2024-08-06 ENCOUNTER — Ambulatory Visit: Admitting: Nurse Practitioner

## 2024-08-06 ENCOUNTER — Encounter: Payer: Self-pay | Admitting: Nurse Practitioner

## 2024-08-06 VITALS — BP 118/78 | HR 81 | Temp 94.5°F | Ht 62.0 in | Wt 165.0 lb

## 2024-08-06 DIAGNOSIS — N3000 Acute cystitis without hematuria: Secondary | ICD-10-CM

## 2024-08-06 DIAGNOSIS — R300 Dysuria: Secondary | ICD-10-CM | POA: Diagnosis not present

## 2024-08-06 LAB — URINALYSIS, ROUTINE W REFLEX MICROSCOPIC
Glucose, UA: NEGATIVE
Ketones, UA: NEGATIVE
Leukocytes,UA: NEGATIVE
Nitrite, UA: POSITIVE — AB
Specific Gravity, UA: 1.03 (ref 1.005–1.030)
Urobilinogen, Ur: 1 mg/dL (ref 0.2–1.0)
pH, UA: 5.5 (ref 5.0–7.5)

## 2024-08-06 LAB — MICROSCOPIC EXAMINATION

## 2024-08-06 MED ORDER — NITROFURANTOIN MONOHYD MACRO 100 MG PO CAPS: 100.0000 mg | ORAL_CAPSULE | Freq: Two times a day (BID) | ORAL | 0 refills | Status: AC

## 2024-08-06 NOTE — Progress Notes (Signed)
 Encounter created in error

## 2024-08-06 NOTE — Progress Notes (Signed)
 "  BP 118/78 (BP Location: Left Arm, Patient Position: Sitting, Cuff Size: Normal)   Pulse 81   Temp (!) 94.5 F (34.7 C) (Oral)   Ht 5' 2 (1.575 m)   Wt 176 lb 12.8 oz (80.2 kg)   LMP  (LMP Unknown)   SpO2 99%   BMI 32.34 kg/m    Subjective:    Patient ID: Mia Thompson, female    DOB: 22-Feb-1976, 48 y.o.   MRN: [id number]  HPI: Mia Thompson is a 48 y.o. female  Chief Complaint  Patient presents with   Urinary Tract Infection    Patient stated she is having urinating frequently, pain/burning when pee, and smell. There is a dark brownish color discharge.   URINARY SYMPTOMS Symptoms started about a week ago Dysuria: burning Urinary frequency: yes Urgency: yes Small volume voids: yes Symptom severity: no Urinary incontinence: no Foul odor: yes Hematuria: no Abdominal pain: no Back pain: no Suprapubic pain/pressure: yes Flank pain: no Fever:  no Vomiting: no Relief with cranberry juice: no Relief with pyridium: no Status: stable Previous urinary tract infection: no Recurrent urinary tract infection: no Sexual activity: No sexually active/monogomous/practicing safe sex History of sexually transmitted disease: no Penile discharge: yes Treatments attempted: increasing fluids   Relevant past medical, surgical, family and social history reviewed and updated as indicated. Interim medical history since our last visit reviewed. Allergies and medications reviewed and updated.  Review of Systems  Constitutional:  Negative for fever.  Gastrointestinal:  Negative for abdominal pain and vomiting.  Genitourinary:  Positive for decreased urine volume, dysuria, frequency and urgency. Negative for flank pain and hematuria.  Musculoskeletal:  Negative for back pain.    Per HPI unless specifically indicated above     Objective:    BP 118/78 (BP Location: Left Arm, Patient Position: Sitting, Cuff Size: Normal)   Pulse 81   Temp (!) 94.5 F (34.7 C) (Oral)   Ht 5' 2  (1.575 m)   Wt 176 lb 12.8 oz (80.2 kg)   LMP  (LMP Unknown)   SpO2 99%   BMI 32.34 kg/m   Wt Readings from Last 3 Encounters:  08/06/24 176 lb 12.8 oz (80.2 kg)  07/25/24 165 lb (74.8 kg)  04/24/24 156 lb 6.4 oz (70.9 kg)    Physical Exam Vitals and nursing note reviewed.  Constitutional:      General: She is not in acute distress.    Appearance: Normal appearance. She is normal weight. She is not ill-appearing, toxic-appearing or diaphoretic.  HENT:     Head: Normocephalic.     Right Ear: External ear normal.     Left Ear: External ear normal.     Nose: Nose normal.     Mouth/Throat:     Mouth: Mucous membranes are moist.     Pharynx: Oropharynx is clear.  Eyes:     General:        Right eye: No discharge.        Left eye: No discharge.     Extraocular Movements: Extraocular movements intact.     Conjunctiva/sclera: Conjunctivae normal.     Pupils: Pupils are equal, round, and reactive to light.  Cardiovascular:     Rate and Rhythm: Normal rate and regular rhythm.     Heart sounds: No murmur heard. Pulmonary:     Effort: Pulmonary effort is normal. No respiratory distress.     Breath sounds: Normal breath sounds. No wheezing or rales.  Abdominal:  General: Abdomen is flat. Bowel sounds are normal. There is no distension.     Palpations: Abdomen is soft. There is no mass.     Tenderness: There is no abdominal tenderness. There is no right CVA tenderness, left CVA tenderness, guarding or rebound.     Hernia: No hernia is present.  Musculoskeletal:     Cervical back: Normal range of motion and neck supple.  Skin:    General: Skin is warm and dry.     Capillary Refill: Capillary refill takes less than 2 seconds.  Neurological:     General: No focal deficit present.     Mental Status: She is alert and oriented to person, place, and time. Mental status is at baseline.  Psychiatric:        Mood and Affect: Mood normal.        Behavior: Behavior normal.         Thought Content: Thought content normal.        Judgment: Judgment normal.     Results for orders placed or performed in visit on 04/24/24  Comprehensive metabolic panel with GFR   Collection Time: 04/24/24  3:02 PM  Result Value Ref Range   Glucose 54 (L) 70 - 99 mg/dL   BUN 13 6 - 24 mg/dL   Creatinine, Ser 8.91 (H) 0.57 - 1.00 mg/dL   eGFR 64 >40 fO/fpw/8.26   BUN/Creatinine Ratio 12 9 - 23   Sodium 141 134 - 144 mmol/L   Potassium 3.9 3.5 - 5.2 mmol/L   Chloride 109 (H) 96 - 106 mmol/L   CO2 20 20 - 29 mmol/L   Calcium 9.1 8.7 - 10.2 mg/dL   Total Protein 6.5 6.0 - 8.5 g/dL   Albumin 4.3 3.9 - 4.9 g/dL   Globulin, Total 2.2 1.5 - 4.5 g/dL   Bilirubin Total 0.2 0.0 - 1.2 mg/dL   Alkaline Phosphatase 53 41 - 116 IU/L   AST 12 0 - 40 IU/L   ALT 10 0 - 32 IU/L  Vitamin D  (25 hydroxy)   Collection Time: 04/24/24  3:02 PM  Result Value Ref Range   Vit D, 25-Hydroxy 44.0 30.0 - 100.0 ng/mL  B12   Collection Time: 04/24/24  3:02 PM  Result Value Ref Range   Vitamin B-12 958 232 - 1,245 pg/mL      Assessment & Plan:   Problem List Items Addressed This Visit   None Visit Diagnoses       Acute cystitis without hematuria    -  Primary   Nitrites positive on UA. Will treat with macrobid .  Complete course of medications.  Urine sent for culture.  Follow up if not improved.   Relevant Orders   Urine Culture     Dysuria       Relevant Orders   Urinalysis, Routine w reflex microscopic        Follow up plan: No follow-ups on file.      "

## 2024-08-07 NOTE — Telephone Encounter (Signed)
 Pharmacy Patient Advocate Encounter  Received notification from [facility] that Prior Authorization for Zepbound  10mg /0.65ml has been DENIED.  Full denial letter will be uploaded to the media tab. See denial reason below.   PA #/Case ID/Reference #: [id number]

## 2024-08-12 ENCOUNTER — Ambulatory Visit: Payer: Self-pay | Admitting: Nurse Practitioner

## 2024-08-12 LAB — URINE CULTURE

## 2024-08-12 MED ORDER — CIPROFLOXACIN HCL 500 MG PO TABS: 500.0000 mg | ORAL_TABLET | Freq: Two times a day (BID) | ORAL | 0 refills | Status: AC

## 2024-08-13 NOTE — Telephone Encounter (Signed)
 PA submitted.

## 2024-08-13 NOTE — Telephone Encounter (Signed)
 Pharmacy Patient Advocate Encounter  Received notification from [facility] that Prior Authorization for Zepbound  has been DENIED.  Full denial letter will be uploaded to the media tab. See denial reason below.   PA #/Case ID/Reference #: [id number]

## 2024-08-14 NOTE — Telephone Encounter (Signed)
 See message from provider. Can this be appealed for the patient?

## 2024-08-14 NOTE — Telephone Encounter (Signed)
 The patient has lost 5% of her body weight.  She has lost 55lbs which is over 5%.

## 2024-08-15 ENCOUNTER — Telehealth: Payer: Self-pay | Admitting: Pharmacist

## 2024-08-15 ENCOUNTER — Ambulatory Visit: Admitting: Certified Nurse Midwife

## 2024-08-15 NOTE — Telephone Encounter (Signed)
 Appeal has been submitted. Will advise when response is received, please be advised that most companies may take 30 days to make a decision. Appeal letter and supporting documentation have been faxed to [phone] on 08/15/2024 @12 :12 pm.  Thank you, Devere Pandy, PharmD Clinical Pharmacist  [place]  Direct Dial: [phone]

## 2024-08-19 ENCOUNTER — Other Ambulatory Visit (HOSPITAL_COMMUNITY): Payer: Self-pay

## 2024-08-20 ENCOUNTER — Other Ambulatory Visit: Payer: Self-pay | Admitting: Medical Genetics

## 2024-08-20 DIAGNOSIS — Z006 Encounter for examination for normal comparison and control in clinical research program: Secondary | ICD-10-CM

## 2024-08-26 NOTE — Telephone Encounter (Signed)
 Provider aware

## 2024-08-27 ENCOUNTER — Other Ambulatory Visit (HOSPITAL_COMMUNITY): Payer: Self-pay

## 2024-09-03 ENCOUNTER — Other Ambulatory Visit (HOSPITAL_COMMUNITY): Payer: Self-pay

## 2024-09-11 ENCOUNTER — Other Ambulatory Visit (HOSPITAL_COMMUNITY): Payer: Self-pay

## 2024-09-11 NOTE — Patient Instructions (Signed)
 Preventive Care 48-48 Years Old, Female Preventive care refers to lifestyle choices and visits with your health care provider that can promote health and wellness. Preventive care visits are also called wellness exams. What can I expect for my preventive care visit? Counseling Your health care provider may ask you questions about your: Medical history, including: Past medical problems. Family medical history. Pregnancy history. Current health, including: Menstrual cycle. Method of birth control. Emotional well-being. Home life and relationship well-being. Sexual activity and sexual health. Lifestyle, including: Alcohol, nicotine or tobacco, and drug use. Access to firearms. Diet, exercise, and sleep habits. Work and work Astronomer. Sunscreen use. Safety issues such as seatbelt and bike helmet use. Physical exam Your health care provider will check your: Height and weight. These may be used to calculate your BMI (body mass index). BMI is a measurement that tells if you are at a healthy weight. Waist circumference. This measures the distance around your waistline. This measurement also tells if you are at a healthy weight and may help predict your risk of certain diseases, such as type 2 diabetes and high blood pressure. Heart rate and blood pressure. Body temperature. Skin for abnormal spots. What immunizations do I need?  Vaccines are usually given at various ages, according to a schedule. Your health care provider will recommend vaccines for you based on your age, medical history, and lifestyle or other factors, such as travel or where you work. What tests do I need? Screening Your health care provider may recommend screening tests for certain conditions. This may include: Lipid and cholesterol levels. Diabetes screening. This is done by checking your blood sugar (glucose) after you have not eaten for a while (fasting). Pelvic exam and Pap test. Hepatitis B test. Hepatitis C  test. HIV (human immunodeficiency virus) test. STI (sexually transmitted infection) testing, if you are at risk. Lung cancer screening. Colorectal cancer screening. Mammogram. Talk with your health care provider about when you should start having regular mammograms. This may depend on whether you have a family history of breast cancer. BRCA-related cancer screening. This may be done if you have a family history of breast, ovarian, tubal, or peritoneal cancers. Bone density scan. This is done to screen for osteoporosis. Talk with your health care provider about your test results, treatment options, and if necessary, the need for more tests. Follow these instructions at home: Eating and drinking  Eat a diet that includes fresh fruits and vegetables, whole grains, lean protein, and low-fat dairy products. Take vitamin and mineral supplements as recommended by your health care provider. Do not drink alcohol if: Your health care provider tells you not to drink. You are pregnant, may be pregnant, or are planning to become pregnant. If you drink alcohol: Limit how much you have to 0-1 drink a day. Know how much alcohol is in your drink. In the [place] one drink equals one 12 oz bottle of beer (355 mL), one 5 oz glass of wine (148 mL), or one 1 oz glass of hard liquor (44 mL). Lifestyle Brush your teeth every morning and night with fluoride toothpaste. Floss one time each day. Exercise for at least 30 minutes 5 or more days each week. Do not use any products that contain nicotine or tobacco. These products include cigarettes, chewing tobacco, and vaping devices, such as e-cigarettes. If you need help quitting, ask your health care provider. Do not use drugs. If you are sexually active, practice safe sex. Use a condom or other form of protection to  prevent STIs. If you do not wish to become pregnant, use a form of birth control. If you plan to become pregnant, see your health care provider for a  prepregnancy visit. Take aspirin only as told by your health care provider. Make sure that you understand how much to take and what form to take. Work with your health care provider to find out whether it is safe and beneficial for you to take aspirin daily. Find healthy ways to manage stress, such as: Meditation, yoga, or listening to music. Journaling. Talking to a trusted person. Spending time with friends and family. Minimize exposure to UV radiation to reduce your risk of skin cancer. Safety Always wear your seat belt while driving or riding in a vehicle. Do not drive: If you have been drinking alcohol. Do not ride with someone who has been drinking. When you are tired or distracted. While texting. If you have been using any mind-altering substances or drugs. Wear a helmet and other protective equipment during sports activities. If you have firearms in your house, make sure you follow all gun safety procedures. Seek help if you have been physically or sexually abused. What's next? Visit your health care provider once a year for an annual wellness visit. Ask your health care provider how often you should have your eyes and teeth checked. Stay up to date on all vaccines. This information is not intended to replace advice given to you by your health care provider. Make sure you discuss any questions you have with your health care provider. Document Revised: 01/20/2021 Document Reviewed: 01/20/2021 Elsevier Patient Education  2024 [employer]. How to Do a Breast Self-Exam Doing breast self-exams can help you stay healthy. They're one way to know what's normal for your breasts. They can help you catch a problem while it's still small and can be treated. You need to: Check your breasts often. Tell your doctor about any changes. You should do breast self-exams even if you have breast implants. What you need: A mirror. A well-lit room. A pillow or other soft object. How to do a  breast self-exam Look for changes  Take off all the clothes above your waist. Stand in front of a mirror in a room with good lighting. Put your hands down at your sides. Compare your breasts in the mirror. Look for difference between them, such as: Differences in shape. Differences in size. Wrinkles, dips, and bumps in one breast and not the other. Look at each breast for skin changes, such as: Redness. Scaly spots. Spots where your skin is thicker. Dimpling. Open sores. Look for changes in your nipples, such as: Fluid coming out of a nipple. Fluid around a nipple. Bleeding. Dimpling. Redness. A nipple that looks pushed in or that has changed position. Feel for changes Lie on your back. Feel each breast. To do this: Pick a breast to feel. Place a pillow under the shoulder closest to that breast. Put the arm closest to that breast behind your head. Feel the breast using the hand of your other arm. Use the pads of your three middle fingers to make small circles starting near the nipple. Use light, medium, and firm pressure. Keep making circles, moving down over the breast. Stop when you feel your ribs. Start making circles with your fingers again, this time going up until you reach your collarbone. Then, make circles out across your breast and into your armpit area. Squeeze your nipple. Check for fluid and lumps. Do these steps again  to check your other breast. Sit or stand in the tub or shower. With soapy water on your skin, feel each breast the same way you did when you were lying down. Write down what you find Writing down what you find can help you keep track of what you want to tell your doctor. Write down: What's normal for each breast. Any changes you find. Write down: The kind of change. If your breast feels tender or painful. Any lump you find. Write down its size and where it is. When you last had your period. General tips If you're breastfeeding, the best time  to check your breasts is after you feed your baby or after you use a breast pump. If you get a period, the best time to check your breasts is 5-7 days after your period ends. With time, you'll get more used to doing the self-exam. You'll also start to know if there are changes in your breasts. Contact a doctor if: You see a change in the shape or size of your breasts or nipples. You see a change in the skin of your breast or nipples. You have fluid coming from your nipples that isn't normal. You find a new lump or thick area. You have breast pain. You have any concerns about your breast health. This information is not intended to replace advice given to you by your health care provider. Make sure you discuss any questions you have with your health care provider. Document Revised: 10/04/2023 Document Reviewed: 10/04/2023 Elsevier Patient Education  2025 [employer].

## 2024-09-11 NOTE — Progress Notes (Unsigned)
 "        GYNECOLOGY ANNUAL PREVENTATIVE CARE ENCOUNTER NOTE  History:     Mia Thompson is a 49 y.o. [id number] female here for a routine annual gynecologic exam.  Current complaints: recurrent uti, being managed by PCP.   Denies abnormal vaginal bleeding, discharge, pelvic pain, problems with intercourse or other gynecologic concerns.     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 (Menstrual status: Oral contraceptives). Contraception: OCP (estrogen/progesterone), reviewed risk vs benefits continued use .  Last Pap: 04/19/23. Results were: normal with negative HPV Last mammogram: 11/14/23. Results were: normal  Obstetric History OB History  Gravida Para Term Preterm AB Living  3 3 3   3   SAB IAB Ectopic Multiple Live Births      3    # Outcome Date GA Lbr Len/2nd Weight Sex Type Anes PTL Lv  3 Term 02/09/10   7 lb 6 oz (3.345 kg) M Vag-Spont  N LIV  2 Term 02/26/04   8 lb 6 oz (3.799 kg) F Vag-Spont  N LIV  1 Term 11/21/00   7 lb 11 oz (3.487 kg) F Vag-Spont  N LIV    Past Medical History:  Diagnosis Date   Anxiety    Depression    GERD (gastroesophageal reflux disease)    Kidney stone    Migraines    Migraines    sees Neuro   Prolapse of female bladder, acquired     Past Surgical History:  Procedure Laterality Date   CHOLECYSTECTOMY  07/02/2024   IUD REMOVAL N/A 05/08/2017   Procedure: INTRAUTERINE DEVICE (IUD) REMOVAL;  Surgeon: Connell Davies, MD;  Location: [facility];  Service: Gynecology;  Laterality: N/A;   LAPAROSCOPIC LYSIS OF ADHESIONS  05/08/2017   Procedure: LAPAROSCOPIC LYSIS OF ADHESIONS;  Surgeon: Connell Davies, MD;  Location: [facility];  Service: Gynecology;;   LAPAROSCOPY N/A 05/08/2017   Procedure: LAPAROSCOPY OPERATIVE;  Surgeon: Connell Davies, MD;  Location: [facility];  Service: Gynecology;  Laterality: N/A;    THUMB SURGERY Left AGE 33   WISDOM TOOTH EXTRACTION      Medications Ordered Prior to Encounter[1]  Allergies[2]  Social History:  reports that she has quit smoking. She has never used smokeless tobacco. She reports that she does not currently use alcohol. She reports that she does not use drugs.  Family History  Adopted: Yes  Problem Relation Age of Onset   Breast cancer Neg Hx     The following portions of the patient's history were reviewed and updated as appropriate: allergies, current medications, past family history, past medical history, past social history, past surgical history and problem list.  Review of Systems Pertinent items noted in HPI and remainder of comprehensive ROS otherwise negative.  Physical Exam:  There were no vitals taken for this visit. CONSTITUTIONAL: Well-developed, well-nourished female in no acute distress.  HENT:  Normocephalic, atraumatic, External right and left ear normal. Oropharynx is clear and moist EYES: Conjunctivae and EOM are normal. Pupils are equal, round, and reactive to light. No scleral icterus.  NECK: Normal range of motion, supple, no masses.  Normal thyroid .  SKIN: Skin is warm and dry. No rash noted. Not diaphoretic. No erythema. No pallor. MUSCULOSKELETAL: Normal range of motion. No tenderness.  No cyanosis, clubbing, or edema.  2+ distal pulses. NEUROLOGIC: Alert and oriented to person, place, and time. Normal reflexes, muscle tone coordination.  PSYCHIATRIC: Normal  mood and affect. Normal behavior. Normal judgment and thought content. CARDIOVASCULAR: Normal heart rate noted, regular rhythm RESPIRATORY: Clear to auscultation bilaterally. Effort and breath sounds normal, no problems with respiration noted. BREASTS: Symmetric in size. No masses, tenderness, skin changes, nipple drainage, or lymphadenopathy bilaterally.  ABDOMEN: Soft, no distention noted.  No tenderness, rebound or guarding.  PELVIC: Normal appearing external  genitalia and urethral meatus; normal appearing vaginal mucosa and cervix.  No abnormal discharge noted.  Pap smear not due.  Normal uterine size, no other palpable masses, no uterine or adnexal tenderness.  .   Assessment and Plan:    1. Well woman exam with routine gynecological exam (Primary)  Pap: not due Mammogram : ordered Labs: none due  Refills: ocp Referral: none Discussed perimenopause and possible Genitourinary syndrome .  Discussed vaginal estrogen as possible treatment. She will discuss with her PCP.  Routine preventative health maintenance measures emphasized. Please refer to After Visit Summary for other counseling recommendations.      Zelda Hummer, CNM [facility]  [facility],  [facility]      [1]  Current Outpatient Medications on File Prior to Visit  Medication Sig Dispense Refill   ARIPiprazole  (ABILIFY ) 2 MG tablet Take 1 tablet (2 mg total) by mouth daily. 90 tablet 1   baclofen  (LIORESAL ) 10 MG tablet Take 1 tablet (10 mg total) by mouth 2 (two) times daily as needed for muscle spasms. 60 tablet 1   cetirizine (ZYRTEC) 10 MG tablet Take 10 mg by mouth at bedtime.     DULoxetine  (CYMBALTA ) 60 MG capsule Take 1 capsule (60 mg total) by mouth daily. 90 capsule 1   Erenumab -aooe (AIMOVIG ) 140 MG/ML SOAJ Inject 140 mg into the skin every 30 (thirty) days. 1 mL 3   Lactobacillus Probiotic TABS Take 5 Billion Cells by mouth daily. 90 tablet 1   Multiple Vitamin (MULTI-VITAMINS) TABS Take 1 tablet by mouth daily.      naproxen  (NAPROSYN ) 500 MG tablet Take 1 tablet (500 mg total) by mouth 2 (two) times daily as needed for headache. 180 tablet 0   pantoprazole  (PROTONIX ) 40 MG tablet Take 1 tablet (40 mg total) by mouth daily. 90 tablet 1   promethazine  (PHENERGAN ) 25 MG tablet Take 1 tablet (25 mg total) by mouth every 8 (eight) hours as needed for nausea or vomiting. 20 tablet 2   rizatriptan  (MAXALT -MLT) 10 MG disintegrating  tablet Take 1 tablet (10 mg total) by mouth as needed for migraine. May repeat in 2 hours if needed 10 tablet 2   nitrofurantoin , macrocrystal-monohydrate, (MACROBID ) 100 MG capsule Take 1 capsule (100 mg total) by mouth 2 (two) times daily. (Patient not taking: Reported on 09/12/2024) 10 capsule 0   tirzepatide  (ZEPBOUND ) 15 MG/0.5ML Pen Inject 15 mg into the skin once a week. (Patient not taking: Reported on 09/12/2024) 2 mL 2   No current facility-administered medications on file prior to visit.  [2]  Allergies Allergen Reactions   Vantin [Cefpodoxime] Rash    Shortness of breath   "

## 2024-09-12 ENCOUNTER — Encounter: Payer: Self-pay | Admitting: Certified Nurse Midwife

## 2024-09-12 ENCOUNTER — Ambulatory Visit: Admitting: Certified Nurse Midwife

## 2024-09-12 VITALS — BP 111/58 | HR 100 | Ht 62.0 in | Wt 176.5 lb

## 2024-09-12 DIAGNOSIS — Z3041 Encounter for surveillance of contraceptive pills: Secondary | ICD-10-CM

## 2024-09-12 DIAGNOSIS — Z01419 Encounter for gynecological examination (general) (routine) without abnormal findings: Secondary | ICD-10-CM

## 2024-09-12 DIAGNOSIS — Z1231 Encounter for screening mammogram for malignant neoplasm of breast: Secondary | ICD-10-CM

## 2024-09-12 MED ORDER — LEVONORGEST-ETH ESTRAD 91-DAY 0.15-0.03 &0.01 MG PO TABS: 1.0000 | ORAL_TABLET | Freq: Every day | ORAL | 4 refills | Status: AC

## 2024-10-24 ENCOUNTER — Ambulatory Visit: Admitting: Nurse Practitioner
